# Patient Record
Sex: Male | Born: 1960 | Race: White | Hispanic: No | Marital: Married | Smoking: Former smoker
Health system: Southern US, Community
[De-identification: ages and names within clinical notes are randomized; demographics above are authoritative.]

## PROBLEM LIST (undated history)

## (undated) DIAGNOSIS — I447 Left bundle-branch block, unspecified: Secondary | ICD-10-CM

## (undated) DIAGNOSIS — R002 Palpitations: Secondary | ICD-10-CM

## (undated) DIAGNOSIS — M722 Plantar fascial fibromatosis: Secondary | ICD-10-CM

## (undated) DIAGNOSIS — G4730 Sleep apnea, unspecified: Secondary | ICD-10-CM

## (undated) DIAGNOSIS — E785 Hyperlipidemia, unspecified: Secondary | ICD-10-CM

## (undated) DIAGNOSIS — Z85828 Personal history of other malignant neoplasm of skin: Secondary | ICD-10-CM

## (undated) DIAGNOSIS — I4891 Unspecified atrial fibrillation: Secondary | ICD-10-CM

## (undated) DIAGNOSIS — I471 Supraventricular tachycardia, unspecified: Secondary | ICD-10-CM

## (undated) DIAGNOSIS — C801 Malignant (primary) neoplasm, unspecified: Secondary | ICD-10-CM

## (undated) DIAGNOSIS — Z7901 Long term (current) use of anticoagulants: Secondary | ICD-10-CM

## (undated) DIAGNOSIS — R0609 Other forms of dyspnea: Secondary | ICD-10-CM

## (undated) DIAGNOSIS — I10 Essential (primary) hypertension: Secondary | ICD-10-CM

## (undated) DIAGNOSIS — M1990 Unspecified osteoarthritis, unspecified site: Secondary | ICD-10-CM

## (undated) DIAGNOSIS — R0602 Shortness of breath: Secondary | ICD-10-CM

## (undated) DIAGNOSIS — G4733 Obstructive sleep apnea (adult) (pediatric): Secondary | ICD-10-CM

## (undated) DIAGNOSIS — N400 Enlarged prostate without lower urinary tract symptoms: Secondary | ICD-10-CM

## (undated) DIAGNOSIS — I2510 Atherosclerotic heart disease of native coronary artery without angina pectoris: Secondary | ICD-10-CM

## (undated) DIAGNOSIS — I495 Sick sinus syndrome: Secondary | ICD-10-CM

## (undated) DIAGNOSIS — K635 Polyp of colon: Secondary | ICD-10-CM

## (undated) HISTORY — DX: Unspecified osteoarthritis, unspecified site: M19.90

---

## 1990-10-16 HISTORY — PX: HERNIA REPAIR: SHX51

## 2000-04-30 ENCOUNTER — Encounter: Payer: Self-pay | Admitting: Emergency Medicine

## 2000-04-30 ENCOUNTER — Emergency Department (HOSPITAL_COMMUNITY): Admission: EM | Admit: 2000-04-30 | Discharge: 2000-04-30 | Payer: Self-pay | Admitting: *Deleted

## 2010-07-10 ENCOUNTER — Emergency Department (HOSPITAL_COMMUNITY): Admission: EM | Admit: 2010-07-10 | Discharge: 2010-07-10 | Payer: Self-pay | Admitting: Family Medicine

## 2011-01-15 HISTORY — PX: COLONOSCOPY: SHX5424

## 2011-01-15 LAB — HM COLONOSCOPY

## 2015-02-17 LAB — LIPID PANEL
CHOLESTEROL: 173 mg/dL (ref 0–200)
HDL: 38 mg/dL (ref 35–70)
LDL Cholesterol: 109 mg/dL
TRIGLYCERIDES: 129 mg/dL (ref 40–160)

## 2015-02-17 LAB — CBC AND DIFFERENTIAL: Hemoglobin: 41.3 g/dL — AB (ref 13.5–17.5)

## 2015-02-17 LAB — BASIC METABOLIC PANEL
BUN: 15 mg/dL (ref 4–21)
Creatinine: 1 mg/dL (ref <=1.3)

## 2015-02-17 LAB — TSH: TSH: 1.9 u[IU]/mL (ref <=5.90)

## 2015-02-17 LAB — PSA: PSA: 3

## 2015-06-14 ENCOUNTER — Other Ambulatory Visit: Payer: Self-pay | Admitting: Internal Medicine

## 2015-07-15 ENCOUNTER — Other Ambulatory Visit: Payer: Self-pay | Admitting: Internal Medicine

## 2015-07-26 ENCOUNTER — Encounter: Payer: Self-pay | Admitting: Internal Medicine

## 2015-07-26 ENCOUNTER — Other Ambulatory Visit: Payer: Self-pay | Admitting: Internal Medicine

## 2015-07-26 DIAGNOSIS — M722 Plantar fascial fibromatosis: Secondary | ICD-10-CM | POA: Insufficient documentation

## 2015-07-26 DIAGNOSIS — I10 Essential (primary) hypertension: Secondary | ICD-10-CM | POA: Insufficient documentation

## 2015-07-26 DIAGNOSIS — Z85828 Personal history of other malignant neoplasm of skin: Secondary | ICD-10-CM | POA: Insufficient documentation

## 2015-07-26 DIAGNOSIS — Z86010 Personal history of colonic polyps: Secondary | ICD-10-CM | POA: Insufficient documentation

## 2015-08-20 ENCOUNTER — Encounter: Payer: Self-pay | Admitting: Internal Medicine

## 2015-08-20 ENCOUNTER — Ambulatory Visit (INDEPENDENT_AMBULATORY_CARE_PROVIDER_SITE_OTHER): Payer: BC Managed Care – PPO | Admitting: Internal Medicine

## 2015-08-20 VITALS — BP 138/88 | HR 64 | Ht 71.0 in | Wt 238.2 lb

## 2015-08-20 DIAGNOSIS — I10 Essential (primary) hypertension: Secondary | ICD-10-CM

## 2015-08-20 DIAGNOSIS — Z86010 Personal history of colonic polyps: Secondary | ICD-10-CM | POA: Diagnosis not present

## 2015-08-20 MED ORDER — VALSARTAN-HYDROCHLOROTHIAZIDE 320-25 MG PO TABS: 1.0000 | ORAL_TABLET | Freq: Every day | ORAL | Status: DC

## 2015-08-20 MED ORDER — AMLODIPINE BESYLATE 10 MG PO TABS: 10.0000 mg | ORAL_TABLET | Freq: Every day | ORAL | Status: DC

## 2015-08-20 NOTE — Progress Notes (Signed)
Date:  08/20/2015   Name:  John Hanna   DOB:  June 01, 1961   MRN:  [id number]   Chief Complaint: Hypertension Hypertension This is a chronic problem. The current episode started more than 1 year ago. The problem is unchanged. The problem is resistant. Pertinent negatives include no chest pain, palpitations or shortness of breath. There are no associated agents to hypertension. Risk factors for coronary artery disease include diabetes mellitus and dyslipidemia. Past treatments include calcium channel blockers, angiotensin blockers and diuretics. The current treatment provides moderate improvement. There are no compliance problems.    He does not check his blood pressures outside of this office. He has been trying to watch his sodium intake. He has lost 4 pounds since last visit. Dizziness - patient reports 1 episode of dizziness while driving. It lasted about one hour and was worse with turning his head. He had no other symptoms at the time such as sinus congestion or nausea. He had eaten a high sodium breakfast that morning.  Review of Systems  Constitutional: Negative for fever, chills and fatigue.  HENT: Negative for ear pain, sinus pressure, sore throat and tinnitus.   Respiratory: Negative for choking, shortness of breath and wheezing.   Cardiovascular: Negative for chest pain, palpitations and leg swelling.  Gastrointestinal: Negative for abdominal pain.  Allergic/Immunologic: Negative for environmental allergies.  Neurological: Positive for dizziness (lasting one minute a few weeks ago). Negative for tremors, syncope and numbness.    Patient Active Problem List   Diagnosis Date Noted  . Essential (primary) hypertension 07/26/2015  . History of colon polyps 07/26/2015  . H/O malignant neoplasm of skin 07/26/2015  . Plantar fasciitis 07/26/2015    Prior to Admission medications   Medication Sig Start Date End Date Taking? Authorizing Provider  amLODipine (NORVASC) 10 MG tablet  Take 1 tablet by mouth daily. 08/19/14  Yes Historical Provider, MD  meloxicam (MOBIC) 15 MG tablet TAKE ONE TABLET BY MOUTH ONCE DAILY 07/15/15  Yes [name], MD  valsartan-hydrochlorothiazide (DIOVAN HCT) 320-25 MG tablet Take 1 tablet by mouth daily. 08/19/14  Yes Historical Provider, MD    No Known Allergies  Past Surgical History  Procedure Laterality Date  . Hernia repair  1992    Social History  Substance Use Topics  . Smoking status: Former Research scientist (life sciences)  . Smokeless tobacco: None  . Alcohol Use: No     Medication list has been reviewed and updated.  Physical Exam  Constitutional: He is oriented to person, place, and time. He appears well-developed and well-nourished. No distress.  HENT:  Head: Normocephalic and atraumatic.  Right Ear: Tympanic membrane and ear canal normal.  Left Ear: Tympanic membrane and ear canal normal.  Nose: Right sinus exhibits no maxillary sinus tenderness and no frontal sinus tenderness. Left sinus exhibits no maxillary sinus tenderness and no frontal sinus tenderness.  Mouth/Throat: Uvula is midline and oropharynx is clear and moist.  Eyes: Conjunctivae and EOM are normal. Pupils are equal, round, and reactive to light. Right eye exhibits no discharge. Left eye exhibits no discharge. No scleral icterus.  Neck: Normal range of motion. No thyromegaly present.  Cardiovascular: Normal rate, regular rhythm and normal heart sounds.   Pulmonary/Chest: Effort normal and breath sounds normal. No respiratory distress.  Musculoskeletal: Normal range of motion. He exhibits no edema.  Neurological: He is alert and oriented to person, place, and time.  Skin: Skin is warm and dry. No rash noted.  Psychiatric: He has a normal mood and  affect. His behavior is normal. Thought content normal.  Nursing note and vitals reviewed.   BP 138/88 mmHg  Pulse 64  Ht 5\' 11"  (1.803 m)  Wt 238 lb 3.2 oz (108.047 kg)  BMI 33.24 kg/m2  Assessment and Plan: 1.  Essential (primary) hypertension Fair control on max dose of 2 medications Continue low-sodium diet and monitor blood pressures outside this office Follow-up if vertigo returns and is persistent - amLODipine (NORVASC) 10 MG tablet; Take 1 tablet (10 mg total) by mouth daily.  Dispense: 30 tablet; Refill: 5 - valsartan-hydrochlorothiazide (DIOVAN HCT) 320-25 MG tablet; Take 1 tablet by mouth daily.  Dispense: 30 tablet; Refill: 5  2. History of colon polyps Patient will schedule colonoscopy in the near future   Halina Maidens, MD [facility]  08/20/2015

## 2016-01-12 ENCOUNTER — Other Ambulatory Visit: Payer: Self-pay | Admitting: Internal Medicine

## 2016-02-21 ENCOUNTER — Ambulatory Visit (INDEPENDENT_AMBULATORY_CARE_PROVIDER_SITE_OTHER): Payer: BC Managed Care – PPO | Admitting: Internal Medicine

## 2016-02-21 ENCOUNTER — Encounter: Payer: Self-pay | Admitting: Internal Medicine

## 2016-02-21 VITALS — BP 120/78 | HR 67 | Temp 98.7°F | Resp 16 | Ht 73.0 in | Wt 246.6 lb

## 2016-02-21 DIAGNOSIS — Z1159 Encounter for screening for other viral diseases: Secondary | ICD-10-CM

## 2016-02-21 DIAGNOSIS — Z125 Encounter for screening for malignant neoplasm of prostate: Secondary | ICD-10-CM | POA: Diagnosis not present

## 2016-02-21 DIAGNOSIS — Z86010 Personal history of colonic polyps: Secondary | ICD-10-CM

## 2016-02-21 DIAGNOSIS — Z0000 Encounter for general adult medical examination without abnormal findings: Secondary | ICD-10-CM | POA: Diagnosis not present

## 2016-02-21 DIAGNOSIS — I10 Essential (primary) hypertension: Secondary | ICD-10-CM

## 2016-02-21 LAB — POCT URINALYSIS DIPSTICK
BILIRUBIN UA: NEGATIVE
Glucose, UA: NEGATIVE
KETONES UA: NEGATIVE
Leukocytes, UA: NEGATIVE
NITRITE UA: NEGATIVE
PH UA: 6
PROTEIN UA: NEGATIVE
RBC UA: NEGATIVE
Spec Grav, UA: 1.025
Urobilinogen, UA: 0.2

## 2016-02-21 MED ORDER — AMLODIPINE BESYLATE 10 MG PO TABS: 10.0000 mg | ORAL_TABLET | Freq: Every day | ORAL | Status: DC

## 2016-02-21 MED ORDER — VALSARTAN-HYDROCHLOROTHIAZIDE 320-25 MG PO TABS: 1.0000 | ORAL_TABLET | Freq: Every day | ORAL | Status: DC

## 2016-02-21 NOTE — Progress Notes (Signed)
Date:  02/21/2016   Name:  John Hanna   DOB:  [birth year]   MRN:  [id number]   Chief Complaint: Annual Exam John Hanna is a 55 y.o. male who presents today for his Complete Annual Exam. He feels well. He reports exercising none but physical work. He reports he is sleeping well.   Hypertension This is a chronic problem. The current episode started more than 1 year ago. The problem is unchanged. The problem is controlled. Pertinent negatives include no chest pain, headaches, palpitations or shortness of breath. Past treatments include angiotensin blockers, diuretics and calcium channel blockers. The current treatment provides significant improvement. There are no compliance problems.     Review of Systems  Constitutional: Negative for chills, diaphoresis, appetite change, fatigue and unexpected weight change.  HENT: Negative for hearing loss, tinnitus, trouble swallowing and voice change.   Eyes: Negative for visual disturbance.  Respiratory: Negative for choking, shortness of breath and wheezing.   Cardiovascular: Negative for chest pain, palpitations and leg swelling.  Gastrointestinal: Negative for abdominal pain, diarrhea, constipation and blood in stool.  Genitourinary: Negative for dysuria, frequency and difficulty urinating.  Musculoskeletal: Negative for myalgias, back pain and arthralgias.  Skin: Negative for color change and rash.  Neurological: Negative for dizziness, syncope and headaches.  Hematological: Negative for adenopathy.  Psychiatric/Behavioral: Negative for sleep disturbance and dysphoric mood.    Patient Active Problem List   Diagnosis Date Noted  . Essential (primary) hypertension 07/26/2015  . History of colon polyps 07/26/2015  . H/O malignant neoplasm of skin 07/26/2015  . Plantar fasciitis 07/26/2015    Prior to Admission medications   Medication Sig Start Date End Date Taking? Authorizing Provider  amLODipine (NORVASC) 10 MG tablet Take 1  tablet (10 mg total) by mouth daily. 08/20/15  Yes [name], MD  meloxicam (MOBIC) 15 MG tablet TAKE ONE TABLET BY MOUTH ONCE DAILY 01/13/16  Yes [name], MD  valsartan-hydrochlorothiazide (DIOVAN HCT) 320-25 MG tablet Take 1 tablet by mouth daily. 08/20/15  Yes [name], MD    No Known Allergies  Past Surgical History  Procedure Laterality Date  . Hernia repair  1992    Social History  Substance Use Topics  . Smoking status: Former Research scientist (life sciences)  . Smokeless tobacco: None  . Alcohol Use: No    Medication list has been reviewed and updated.  Physical Exam  Constitutional: He is oriented to person, place, and time. He appears well-developed and well-nourished.  HENT:  Head: Normocephalic.  Right Ear: Tympanic membrane, external ear and ear canal normal.  Left Ear: Tympanic membrane, external ear and ear canal normal.  Nose: Nose normal.  Mouth/Throat: Uvula is midline and oropharynx is clear and moist.  Eyes: Conjunctivae and EOM are normal. Pupils are equal, round, and reactive to light.  Neck: Normal range of motion. Neck supple. Carotid bruit is not present. No thyromegaly present.  Cardiovascular: Normal rate, regular rhythm, normal heart sounds and intact distal pulses.   Pulmonary/Chest: Effort normal and breath sounds normal. He has no wheezes. Right breast exhibits no mass. Left breast exhibits no mass.  Abdominal: Soft. Normal appearance and bowel sounds are normal. There is no hepatosplenomegaly. There is no tenderness.  Genitourinary: Rectum normal and prostate normal. Prostate is not enlarged and not tender.  Musculoskeletal: Normal range of motion.  Lymphadenopathy:    He has no cervical adenopathy.  Neurological: He is alert and oriented to person, place, and time. He has normal  reflexes.  Skin: Skin is warm, dry and intact.  Psychiatric: He has a normal mood and affect. His speech is normal and behavior is normal. Judgment and thought content  normal.  Nursing note and vitals reviewed.   BP 120/78 mmHg  Pulse 67  Temp(Src) 98.7 F (37.1 C) (Oral)  Resp 16  Ht 6\' 1"  (1.854 m)  Wt 246 lb 9.6 oz (111.857 kg)  BMI 32.54 kg/m2  SpO2 97%  Assessment and Plan: 1. Annual physical exam Normal exam; encourage regular exercise Planning to retire this summer - Lipid panel - POCT urinalysis dipstick  2. Essential (primary) hypertension controlled - CBC with Differential/Platelet - Comprehensive metabolic panel - valsartan-hydrochlorothiazide (DIOVAN HCT) 320-25 MG tablet; Take 1 tablet by mouth daily.  Dispense: 30 tablet; Refill: 12 - amLODipine (NORVASC) 10 MG tablet; Take 1 tablet (10 mg total) by mouth daily.  Dispense: 30 tablet; Refill: 12  3. History of colon polyps Patient enoucouraged to schedule follow up  4. Prostate cancer screening Normal exam - - PSA  5. Need for hepatitis C screening test - Hepatitis C antibody   Halina Maidens, MD [facility]  02/21/2016

## 2016-02-21 NOTE — Patient Instructions (Signed)
DASH Eating Plan  DASH stands for "Dietary Approaches to Stop Hypertension." The DASH eating plan is a healthy eating plan that has been shown to reduce high blood pressure (hypertension). Additional health benefits may include reducing the risk of type 2 diabetes mellitus, heart disease, and stroke. The DASH eating plan may also help with weight loss.  WHAT DO I NEED TO KNOW ABOUT THE DASH EATING PLAN?  For the DASH eating plan, you will follow these general guidelines:  · Choose foods with a percent daily value for sodium of less than 5% (as listed on the food label).  · Use salt-free seasonings or herbs instead of table salt or sea salt.  · Check with your health care provider or pharmacist before using salt substitutes.  · Eat lower-sodium products, often labeled as "lower sodium" or "no salt added."  · Eat fresh foods.  · Eat more vegetables, fruits, and low-fat dairy products.  · Choose whole grains. Look for the word "whole" as the first word in the ingredient list.  · Choose fish and skinless chicken or turkey more often than red meat. Limit fish, poultry, and meat to 6 oz (170 g) each day.  · Limit sweets, desserts, sugars, and sugary drinks.  · Choose heart-healthy fats.  · Limit cheese to 1 oz (28 g) per day.  · Eat more home-cooked food and less restaurant, buffet, and fast food.  · Limit fried foods.  · Cook foods using methods other than frying.  · Limit canned vegetables. If you do use them, rinse them well to decrease the sodium.  · When eating at a restaurant, ask that your food be prepared with less salt, or no salt if possible.  WHAT FOODS CAN I EAT?  Seek help from a dietitian for individual calorie needs.  Grains  Whole grain or whole wheat bread. Brown rice. Whole grain or whole wheat pasta. Quinoa, bulgur, and whole grain cereals. Low-sodium cereals. Corn or whole wheat flour tortillas. Whole grain cornbread. Whole grain crackers. Low-sodium crackers.  Vegetables  Fresh or frozen vegetables  (raw, steamed, roasted, or grilled). Low-sodium or reduced-sodium tomato and vegetable juices. Low-sodium or reduced-sodium tomato sauce and paste. Low-sodium or reduced-sodium canned vegetables.   Fruits  All fresh, canned (in natural juice), or frozen fruits.  Meat and Other Protein Products  Ground beef (85% or leaner), grass-fed beef, or beef trimmed of fat. Skinless chicken or turkey. Ground chicken or turkey. Pork trimmed of fat. All fish and seafood. Eggs. Dried beans, peas, or lentils. Unsalted nuts and seeds. Unsalted canned beans.  Dairy  Low-fat dairy products, such as skim or 1% milk, 2% or reduced-fat cheeses, low-fat ricotta or cottage cheese, or plain low-fat yogurt. Low-sodium or reduced-sodium cheeses.  Fats and Oils  Tub margarines without trans fats. Light or reduced-fat mayonnaise and salad dressings (reduced sodium). Avocado. Safflower, olive, or canola oils. Natural peanut or almond butter.  Other  Unsalted popcorn and pretzels.  The items listed above may not be a complete list of recommended foods or beverages. Contact your dietitian for more options.  WHAT FOODS ARE NOT RECOMMENDED?  Grains  White bread. White pasta. White rice. Refined cornbread. Bagels and croissants. Crackers that contain trans fat.  Vegetables  Creamed or fried vegetables. Vegetables in a cheese sauce. Regular canned vegetables. Regular canned tomato sauce and paste. Regular tomato and vegetable juices.  Fruits  Dried fruits. Canned fruit in light or heavy syrup. Fruit juice.  Meat and Other Protein   Products  Fatty cuts of meat. Ribs, chicken wings, bacon, sausage, bologna, salami, chitterlings, fatback, hot dogs, bratwurst, and packaged luncheon meats. Salted nuts and seeds. Canned beans with salt.  Dairy  Whole or 2% milk, cream, half-and-half, and cream cheese. Whole-fat or sweetened yogurt. Full-fat cheeses or blue cheese. Nondairy creamers and whipped toppings. Processed cheese, cheese spreads, or cheese  curds.  Condiments  Onion and garlic salt, seasoned salt, table salt, and sea salt. Canned and packaged gravies. Worcestershire sauce. Tartar sauce. Barbecue sauce. Teriyaki sauce. Soy sauce, including reduced sodium. Steak sauce. Fish sauce. Oyster sauce. Cocktail sauce. Horseradish. Ketchup and mustard. Meat flavorings and tenderizers. Bouillon cubes. Hot sauce. Tabasco sauce. Marinades. Taco seasonings. Relishes.  Fats and Oils  Butter, stick margarine, lard, shortening, ghee, and bacon fat. Coconut, palm kernel, or palm oils. Regular salad dressings.  Other  Pickles and olives. Salted popcorn and pretzels.  The items listed above may not be a complete list of foods and beverages to avoid. Contact your dietitian for more information.  WHERE CAN I FIND MORE INFORMATION?  National Heart, Lung, and Blood Institute: www.nhlbi.nih.gov/health/health-topics/topics/dash/     This information is not intended to replace advice given to you by your health care provider. Make sure you discuss any questions you have with your health care provider.     Document Released: 09/21/2011 Document Revised: 10/23/2014 Document Reviewed: 08/06/2013  Elsevier Interactive Patient Education ©2016 [employer] Inc[employer]

## 2016-02-22 LAB — COMPREHENSIVE METABOLIC PANEL
A/G RATIO: 1.4 (ref 1.2–2.2)
ALBUMIN: 4.2 g/dL (ref 3.5–5.5)
ALT: 36 IU/L (ref 0–44)
AST: 20 IU/L (ref 0–40)
Alkaline Phosphatase: 50 IU/L (ref 39–117)
BUN / CREAT RATIO: 13 (ref 9–20)
BUN: 14 mg/dL (ref 6–24)
Bilirubin Total: 0.4 mg/dL (ref 0.0–1.2)
CALCIUM: 9.1 mg/dL (ref 8.7–10.2)
CO2: 25 mmol/L (ref 18–29)
Chloride: 97 mmol/L (ref 96–106)
Creatinine, Ser: 1.05 mg/dL (ref 0.76–1.27)
GFR calc Af Amer: 94 mL/min/{1.73_m2} (ref >59)
GFR, EST NON AFRICAN AMERICAN: 81 mL/min/{1.73_m2} (ref >59)
GLOBULIN, TOTAL: 2.9 g/dL (ref 1.5–4.5)
Glucose: 105 mg/dL — ABNORMAL HIGH (ref 65–99)
Potassium: 4.3 mmol/L (ref 3.5–5.2)
SODIUM: 139 mmol/L (ref 134–144)
TOTAL PROTEIN: 7.1 g/dL (ref 6.0–8.5)

## 2016-02-22 LAB — LIPID PANEL
CHOL/HDL RATIO: 4.4 ratio (ref 0.0–5.0)
CHOLESTEROL TOTAL: 174 mg/dL (ref 100–199)
HDL: 40 mg/dL (ref >39)
LDL CALC: 106 mg/dL — AB (ref 0–99)
TRIGLYCERIDES: 138 mg/dL (ref 0–149)
VLDL CHOLESTEROL CAL: 28 mg/dL (ref 5–40)

## 2016-02-22 LAB — CBC WITH DIFFERENTIAL/PLATELET
BASOS: 0 %
Basophils Absolute: 0 10*3/uL (ref 0.0–0.2)
EOS (ABSOLUTE): 0.2 10*3/uL (ref 0.0–0.4)
EOS: 2 %
HEMATOCRIT: 44.7 % (ref 37.5–51.0)
Hemoglobin: 14.7 g/dL (ref 12.6–17.7)
IMMATURE GRANS (ABS): 0 10*3/uL (ref 0.0–0.1)
IMMATURE GRANULOCYTES: 0 %
LYMPHS: 31 %
Lymphocytes Absolute: 2.6 10*3/uL (ref 0.7–3.1)
MCH: 29.8 pg (ref 26.6–33.0)
MCHC: 32.9 g/dL (ref 31.5–35.7)
MCV: 91 fL (ref 79–97)
MONOS ABS: 0.8 10*3/uL (ref 0.1–0.9)
Monocytes: 9 %
NEUTROS PCT: 58 %
Neutrophils Absolute: 4.9 10*3/uL (ref 1.4–7.0)
Platelets: 253 10*3/uL (ref 150–379)
RBC: 4.93 x10E6/uL (ref 4.14–5.80)
RDW: 14 % (ref 12.3–15.4)
WBC: 8.5 10*3/uL (ref 3.4–10.8)

## 2016-02-22 LAB — PSA: Prostate Specific Ag, Serum: 2.6 ng/mL (ref 0.0–4.0)

## 2016-02-22 LAB — HEPATITIS C ANTIBODY: Hep C Virus Ab: <0.1 s/co ratio (ref 0.0–0.9)

## 2016-08-23 ENCOUNTER — Ambulatory Visit (INDEPENDENT_AMBULATORY_CARE_PROVIDER_SITE_OTHER): Payer: BC Managed Care – PPO | Admitting: Internal Medicine

## 2016-08-23 ENCOUNTER — Encounter: Payer: Self-pay | Admitting: Internal Medicine

## 2016-08-23 VITALS — BP 132/84 | HR 68 | Resp 14 | Ht 74.0 in | Wt 238.6 lb

## 2016-08-23 DIAGNOSIS — R42 Dizziness and giddiness: Secondary | ICD-10-CM | POA: Diagnosis not present

## 2016-08-23 DIAGNOSIS — I10 Essential (primary) hypertension: Secondary | ICD-10-CM | POA: Diagnosis not present

## 2016-08-23 NOTE — Progress Notes (Signed)
Date:  08/23/2016   Name:  John Hanna   DOB:  1961/09/30   MRN:  [id number]   Chief Complaint: Hypertension Hypertension  Pertinent negatives include no chest pain, headaches, palpitations or shortness of breath. Past treatments include angiotensin blockers, diuretics and calcium channel blockers. The current treatment provides significant improvement.  Dizziness  This is a new problem. The current episode started 1 to 4 weeks ago. The problem occurs rarely. The problem has been waxing and waning. Associated symptoms include congestion and vertigo. Pertinent negatives include no abdominal pain, chest pain, coughing, fatigue, headaches, numbness or rash. The symptoms are aggravated by twisting. He has tried nothing for the symptoms.      Review of Systems  Constitutional: Negative for appetite change, fatigue and unexpected weight change.  HENT: Positive for congestion.   Eyes: Negative for visual disturbance.  Respiratory: Negative for cough, shortness of breath and wheezing.   Cardiovascular: Negative for chest pain, palpitations and leg swelling.  Gastrointestinal: Negative for abdominal pain and blood in stool.  Endocrine: Negative for polydipsia and polyuria.  Genitourinary: Negative for dysuria and hematuria.  Skin: Negative for color change and rash.  Neurological: Positive for dizziness and vertigo. Negative for tremors, numbness and headaches.  Psychiatric/Behavioral: Negative for dysphoric mood.    Patient Active Problem List   Diagnosis Date Noted  . Essential (primary) hypertension 07/26/2015  . History of colon polyps 07/26/2015  . H/O malignant neoplasm of skin 07/26/2015  . Plantar fasciitis 07/26/2015    Prior to Admission medications   Medication Sig Start Date End Date Taking? Authorizing Provider  amLODipine (NORVASC) 10 MG tablet Take 1 tablet (10 mg total) by mouth daily. 02/21/16   [name], MD  meloxicam (MOBIC) 15 MG tablet TAKE ONE TABLET  BY MOUTH ONCE DAILY 01/13/16   [name], MD  valsartan-hydrochlorothiazide (DIOVAN HCT) 320-25 MG tablet Take 1 tablet by mouth daily. 02/21/16   [name], MD    No Known Allergies  Past Surgical History:  Procedure Laterality Date  . HERNIA REPAIR  1992    Social History  Substance Use Topics  . Smoking status: Former Research scientist (life sciences)  . Smokeless tobacco: Not on file  . Alcohol use No     Medication list has been reviewed and updated.   Physical Exam  Constitutional: He is oriented to person, place, and time. He appears well-developed and well-nourished. No distress.  HENT:  Head: Normocephalic and atraumatic.  Right Ear: Tympanic membrane and ear canal normal.  Left Ear: Tympanic membrane and ear canal normal.  Eyes: Conjunctivae and EOM are normal. Pupils are equal, round, and reactive to light.  Neck: Normal range of motion. Neck supple. No thyromegaly present.  Cardiovascular: Normal rate, regular rhythm and normal heart sounds.   Pulmonary/Chest: Effort normal and breath sounds normal. No respiratory distress.  Musculoskeletal: Normal range of motion. He exhibits no edema or tenderness.  Lymphadenopathy:    He has no cervical adenopathy.  Neurological: He is alert and oriented to person, place, and time.  Skin: Skin is warm and dry. No rash noted.  Psychiatric: He has a normal mood and affect. His behavior is normal. Thought content normal.  Nursing note and vitals reviewed.   BP 132/84   Pulse 68   Resp 14   Ht 6\' 2"  (1.88 m)   Wt 238 lb 9.6 oz (108.2 kg)   BMI 30.63 kg/m   Assessment and Plan: 1. Essential (primary) hypertension Controlled -  continue current medication  2. Vertigo Transient - recommend decongestants PRN   Halina Maidens, MD [facility]  08/23/2016

## 2016-11-24 ENCOUNTER — Ambulatory Visit (INDEPENDENT_AMBULATORY_CARE_PROVIDER_SITE_OTHER): Payer: BC Managed Care – PPO | Admitting: Internal Medicine

## 2016-11-24 ENCOUNTER — Encounter: Payer: Self-pay | Admitting: Internal Medicine

## 2016-11-24 VITALS — BP 142/82 | HR 92 | Temp 99.2°F | Resp 16 | Ht 73.0 in | Wt 233.0 lb

## 2016-11-24 DIAGNOSIS — J111 Influenza due to unidentified influenza virus with other respiratory manifestations: Secondary | ICD-10-CM

## 2016-11-24 DIAGNOSIS — R69 Illness, unspecified: Secondary | ICD-10-CM

## 2016-11-24 MED ORDER — GUAIFENESIN-CODEINE 100-10 MG/5ML PO SYRP: 5.0000 mL | ORAL_SOLUTION | Freq: Three times a day (TID) | ORAL | 0 refills | Status: DC | PRN

## 2016-11-24 MED ORDER — OSELTAMIVIR PHOSPHATE 75 MG PO CAPS: 75.0000 mg | ORAL_CAPSULE | Freq: Two times a day (BID) | ORAL | 0 refills | Status: DC

## 2016-11-24 NOTE — Progress Notes (Signed)
Date:  11/24/2016   Name:  John Hanna   DOB:  1961/05/03   MRN:  [id number]   Chief Complaint: URI (since yesterday. ) URI   This is a new problem. The current episode started yesterday. The problem has been gradually worsening. There has been no fever. Associated symptoms include coughing and headaches. Pertinent negatives include no abdominal pain, chest pain, nausea, sore throat, vomiting or wheezing. He has tried nothing for the symptoms.  Onset yesterday - has been in and out of nursing home and hospital with uncle who passed away yesterday from pneumonia and influenza.    Review of Systems  Constitutional: Positive for chills, fatigue and fever.  HENT: Positive for postnasal drip. Negative for sore throat.   Eyes: Negative for visual disturbance.  Respiratory: Positive for cough. Negative for shortness of breath and wheezing.   Cardiovascular: Negative for chest pain.  Gastrointestinal: Negative for abdominal pain, nausea and vomiting.  Musculoskeletal: Positive for myalgias.  Neurological: Positive for headaches.    Patient Active Problem List   Diagnosis Date Noted  . Essential (primary) hypertension 07/26/2015  . History of colon polyps 07/26/2015  . H/O malignant neoplasm of skin 07/26/2015  . Plantar fasciitis 07/26/2015    Prior to Admission medications   Medication Sig Start Date End Date Taking? Authorizing Provider  amLODipine (NORVASC) 10 MG tablet Take 1 tablet (10 mg total) by mouth daily. 02/21/16  Yes [name], MD  valsartan-hydrochlorothiazide (DIOVAN HCT) 320-25 MG tablet Take 1 tablet by mouth daily. 02/21/16  Yes [name], MD  meloxicam (MOBIC) 15 MG tablet TAKE ONE TABLET BY MOUTH ONCE DAILY Patient not taking: Reported on 11/24/2016 01/13/16   [name], MD    No Known Allergies  Past Surgical History:  Procedure Laterality Date  . HERNIA REPAIR  1992    Social History  Substance Use Topics  . Smoking status: Former  Research scientist (life sciences)  . Smokeless tobacco: Never Used  . Alcohol use No     Medication list has been reviewed and updated.   Physical Exam  Constitutional: He is oriented to person, place, and time. He appears well-developed. No distress.  HENT:  Head: Normocephalic and atraumatic.  Right Ear: Tympanic membrane and ear canal normal.  Left Ear: Tympanic membrane and ear canal normal.  Nose: Right sinus exhibits no maxillary sinus tenderness and no frontal sinus tenderness. Left sinus exhibits no maxillary sinus tenderness and no frontal sinus tenderness.  Mouth/Throat: Posterior oropharyngeal erythema present. No posterior oropharyngeal edema.  Cardiovascular: Normal rate, regular rhythm and normal heart sounds.   Pulmonary/Chest: Effort normal. He has decreased breath sounds.  Musculoskeletal: Normal range of motion.  Neurological: He is alert and oriented to person, place, and time.  Skin: Skin is warm and dry. No rash noted.  Psychiatric: He has a normal mood and affect. His speech is normal and behavior is normal. Thought content normal.  Nursing note and vitals reviewed.   BP (!) 142/82 (BP Location: Right Arm, Patient Position: Sitting, Cuff Size: Large)   Pulse 92   Temp 98.9 F (37.2 C)   Resp 16   Ht 6\' 1"  (1.854 m)   Wt 233 lb (105.7 kg)   SpO2 98%   BMI 30.74 kg/m   Assessment and Plan: 1. Influenza-like illness Tylenol, fluids and rest - oseltamivir (TAMIFLU) 75 MG capsule; Take 1 capsule (75 mg total) by mouth 2 (two) times daily.  Dispense: 10 capsule; Refill: 0 - guaiFENesin-codeine (  ROBITUSSIN AC) 100-10 MG/5ML syrup; Take 5 mLs by mouth 3 (three) times daily as needed for cough.  Dispense: 150 mL; Refill: 0   Halina Maidens, MD [facility]  11/24/2016

## 2016-11-24 NOTE — Patient Instructions (Signed)

## 2017-02-20 ENCOUNTER — Ambulatory Visit (INDEPENDENT_AMBULATORY_CARE_PROVIDER_SITE_OTHER): Payer: BC Managed Care – PPO | Admitting: Internal Medicine

## 2017-02-20 ENCOUNTER — Encounter: Payer: Self-pay | Admitting: Internal Medicine

## 2017-02-20 VITALS — BP 122/84 | HR 62 | Ht 73.0 in | Wt 232.0 lb

## 2017-02-20 DIAGNOSIS — I10 Essential (primary) hypertension: Secondary | ICD-10-CM

## 2017-02-20 DIAGNOSIS — Z0000 Encounter for general adult medical examination without abnormal findings: Secondary | ICD-10-CM

## 2017-02-20 DIAGNOSIS — Z125 Encounter for screening for malignant neoplasm of prostate: Secondary | ICD-10-CM

## 2017-02-20 DIAGNOSIS — D485 Neoplasm of uncertain behavior of skin: Secondary | ICD-10-CM

## 2017-02-20 DIAGNOSIS — M722 Plantar fascial fibromatosis: Secondary | ICD-10-CM

## 2017-02-20 DIAGNOSIS — Z86010 Personal history of colonic polyps: Secondary | ICD-10-CM

## 2017-02-20 LAB — POCT URINALYSIS DIPSTICK
Bilirubin, UA: NEGATIVE
Glucose, UA: NEGATIVE
Ketones, UA: NEGATIVE
LEUKOCYTES UA: NEGATIVE
NITRITE UA: NEGATIVE
PH UA: 5 (ref 5.0–8.0)
Protein, UA: NEGATIVE
Spec Grav, UA: 1.01 (ref 1.010–1.025)
UROBILINOGEN UA: 0.2 U/dL

## 2017-02-20 MED ORDER — AMLODIPINE BESYLATE 10 MG PO TABS: 10.0000 mg | ORAL_TABLET | Freq: Every day | ORAL | 12 refills | Status: DC

## 2017-02-20 MED ORDER — VALSARTAN-HYDROCHLOROTHIAZIDE 320-25 MG PO TABS: 1.0000 | ORAL_TABLET | Freq: Every day | ORAL | 12 refills | Status: DC

## 2017-02-20 NOTE — Patient Instructions (Addendum)
Call [facility] Medicine to schedule colonoscopy. Consult Dermatology  DASH Eating Plan DASH stands for "Dietary Approaches to Stop Hypertension." The DASH eating plan is a healthy eating plan that has been shown to reduce high blood pressure (hypertension). It may also reduce your risk for type 2 diabetes, heart disease, and stroke. The DASH eating plan may also help with weight loss. What are tips for following this plan? General guidelines   Avoid eating more than 2,300 mg (milligrams) of salt (sodium) a day. If you have hypertension, you may need to reduce your sodium intake to 1,500 mg a day.  Limit alcohol intake to no more than 1 drink a day for nonpregnant women and 2 drinks a day for men. One drink equals 12 oz of beer, 5 oz of wine, or 1 oz of hard liquor.  Work with your health care provider to maintain a healthy body weight or to lose weight. Ask what an ideal weight is for you.  Get at least 30 minutes of exercise that causes your heart to beat faster (aerobic exercise) most days of the week. Activities may include walking, swimming, or biking.  Work with your health care provider or diet and nutrition specialist (dietitian) to adjust your eating plan to your individual calorie needs. Reading food labels   Check food labels for the amount of sodium per serving. Choose foods with less than 5 percent of the Daily Value of sodium. Generally, foods with less than 300 mg of sodium per serving fit into this eating plan.  To find whole grains, look for the word "whole" as the first word in the ingredient list. Shopping   Buy products labeled as "low-sodium" or "no salt added."  Buy fresh foods. Avoid canned foods and premade or frozen meals. Cooking   Avoid adding salt when cooking. Use salt-free seasonings or herbs instead of table salt or sea salt. Check with your health care provider or pharmacist before using salt substitutes.  Do not fry foods. Cook foods using  healthy methods such as baking, boiling, grilling, and broiling instead.  Cook with heart-healthy oils, such as olive, canola, soybean, or sunflower oil. Meal planning    Eat a balanced diet that includes:  5 or more servings of fruits and vegetables each day. At each meal, try to fill half of your plate with fruits and vegetables.  Up to 6-8 servings of whole grains each day.  Less than 6 oz of lean meat, poultry, or fish each day. A 3-oz serving of meat is about the same size as a deck of cards. One egg equals 1 oz.  2 servings of low-fat dairy each day.  A serving of nuts, seeds, or beans 5 times each week.  Heart-healthy fats. Healthy fats called Omega-3 fatty acids are found in foods such as flaxseeds and coldwater fish, like sardines, salmon, and mackerel.  Limit how much you eat of the following:  Canned or prepackaged foods.  Food that is high in trans fat, such as fried foods.  Food that is high in saturated fat, such as fatty meat.  Sweets, desserts, sugary drinks, and other foods with added sugar.  Full-fat dairy products.  Do not salt foods before eating.  Try to eat at least 2 vegetarian meals each week.  Eat more home-cooked food and less restaurant, buffet, and fast food.  When eating at a restaurant, ask that your food be prepared with less salt or no salt, if possible. What foods are  recommended? The items listed may not be a complete list. Talk with your dietitian about what dietary choices are best for you. Grains  Whole-grain or whole-wheat bread. Whole-grain or whole-wheat pasta. Brown rice. [place]. Bulgur. Whole-grain and low-sodium cereals. Pita bread. Low-fat, low-sodium crackers. Whole-wheat flour tortillas. Vegetables  Fresh or frozen vegetables (raw, steamed, roasted, or grilled). Low-sodium or reduced-sodium tomato and vegetable juice. Low-sodium or reduced-sodium tomato sauce and tomato paste. Low-sodium or reduced-sodium canned  vegetables. Fruits  All fresh, dried, or frozen fruit. Canned fruit in natural juice (without added sugar). Meat and other protein foods  Skinless chicken or [place]. Ground chicken or [place]. Pork with fat trimmed off. Fish and seafood. Egg whites. Dried beans, peas, or lentils. Unsalted nuts, nut butters, and seeds. Unsalted canned beans. Lean cuts of beef with fat trimmed off. Low-sodium, lean deli meat. Dairy  Low-fat (1%) or fat-free (skim) milk. Fat-free, low-fat, or reduced-fat cheeses. Nonfat, low-sodium ricotta or cottage cheese. Low-fat or nonfat yogurt. Low-fat, low-sodium cheese. Fats and oils  Soft margarine without trans fats. Vegetable oil. Low-fat, reduced-fat, or light mayonnaise and salad dressings (reduced-sodium). Canola, safflower, olive, soybean, and sunflower oils. Avocado. Seasoning and other foods  Herbs. Spices. Seasoning mixes without salt. Unsalted popcorn and pretzels. Fat-free sweets. What foods are not recommended? The items listed may not be a complete list. Talk with your dietitian about what dietary choices are best for you. Grains  Baked goods made with fat, such as croissants, muffins, or some breads. Dry pasta or rice meal packs. Vegetables  Creamed or fried vegetables. Vegetables in a cheese sauce. Regular canned vegetables (not low-sodium or reduced-sodium). Regular canned tomato sauce and paste (not low-sodium or reduced-sodium). Regular tomato and vegetable juice (not low-sodium or reduced-sodium). Angie Fava. Olives. Fruits  Canned fruit in a light or heavy syrup. Fried fruit. Fruit in cream or butter sauce. Meat and other protein foods  Fatty cuts of meat. Ribs. Fried meat. Berniece Salines. Sausage. Bologna and other processed lunch meats. Salami. Fatback. Hotdogs. Bratwurst. Salted nuts and seeds. Canned beans with added salt. Canned or smoked fish. Whole eggs or egg yolks. Chicken or [place] with skin. Dairy  Whole or 2% milk, cream, and half-and-half. Whole or  full-fat cream cheese. Whole-fat or sweetened yogurt. Full-fat cheese. Nondairy creamers. Whipped toppings. Processed cheese and cheese spreads. Fats and oils  Butter. Stick margarine. Lard. Shortening. Ghee. Bacon fat. Tropical oils, such as coconut, palm kernel, or palm oil. Seasoning and other foods  Salted popcorn and pretzels. Onion salt, garlic salt, seasoned salt, table salt, and sea salt. Worcestershire sauce. Tartar sauce. Barbecue sauce. Teriyaki sauce. Soy sauce, including reduced-sodium. Steak sauce. Canned and packaged gravies. Fish sauce. Oyster sauce. Cocktail sauce. Horseradish that you find on the shelf. Ketchup. Mustard. Meat flavorings and tenderizers. Bouillon cubes. Hot sauce and Tabasco sauce. Premade or packaged marinades. Premade or packaged taco seasonings. Relishes. Regular salad dressings. Where to find more information:  National Heart, Lung, and [place]: https://wilson-eaton.com/  American Heart Association: www.heart.org Summary  The DASH eating plan is a healthy eating plan that has been shown to reduce high blood pressure (hypertension). It may also reduce your risk for type 2 diabetes, heart disease, and stroke.  With the DASH eating plan, you should limit salt (sodium) intake to 2,300 mg a day. If you have hypertension, you may need to reduce your sodium intake to 1,500 mg a day.  When on the DASH eating plan, aim to eat more fresh fruits and vegetables,  whole grains, lean proteins, low-fat dairy, and heart-healthy fats.  Work with your health care provider or diet and nutrition specialist (dietitian) to adjust your eating plan to your individual calorie needs. This information is not intended to replace advice given to you by your health care provider. Make sure you discuss any questions you have with your health care provider. Document Released: 09/21/2011 Document Revised: 09/25/2016 Document Reviewed: 09/25/2016 Elsevier Interactive Patient Education  2017  [employer].

## 2017-02-20 NOTE — Progress Notes (Signed)
Date:  02/20/2017   Name:  John Hanna   DOB:  1961-04-27   MRN:  [id number]   Chief Complaint: Annual Exam John Hanna is a 56 y.o. male who presents today for his Complete Annual Exam. He feels well. 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 He reports he is sleeping well.   Hypertension  This is a chronic problem. The problem is controlled. Associated symptoms include chest pain (mild chest pain last week). Pertinent negatives include no headaches, palpitations or shortness of breath. Past treatments include calcium channel blockers, angiotensin blockers and diuretics. The current treatment provides significant improvement.     Review of Systems  Constitutional: Negative for appetite change, chills, diaphoresis, fatigue and unexpected weight change.  HENT: Negative for hearing loss, tinnitus, trouble swallowing and voice change.   Eyes: Negative for visual disturbance.  Respiratory: Negative for choking, shortness of breath and wheezing.   Cardiovascular: Positive for chest pain (mild chest pain last week). Negative for palpitations and leg swelling.  Gastrointestinal: Negative for abdominal pain, blood in stool, constipation and diarrhea.  Endocrine: Negative for polydipsia and polyuria.  Genitourinary: Negative for difficulty urinating, dysuria and frequency.  Musculoskeletal: Negative for arthralgias, back pain and myalgias.  Skin: Negative for color change and rash.  Allergic/Immunologic: Negative for environmental allergies.  Neurological: Negative for dizziness, syncope and headaches.  Hematological: Negative for adenopathy.  Psychiatric/Behavioral: Negative for dysphoric mood and sleep disturbance.    Patient Active Problem List   Diagnosis Date Noted  . Essential (primary) hypertension 07/26/2015  . History of colon polyps 07/26/2015  . H/O malignant neoplasm of skin 07/26/2015  . Plantar fasciitis 07/26/2015    Prior to  Admission medications   Medication Sig Start Date End Date Taking? Authorizing Provider  amLODipine (NORVASC) 10 MG tablet Take 1 tablet (10 mg total) by mouth daily. 02/21/16  Yes [name], MD      Yes [name], MD  meloxicam [facility]) 15 MG tablet TAKE ONE TABLET BY MOUTH ONCE DAILY 01/13/16  Yes [name], MD  valsartan-hydrochlorothiazide (DIOVAN HCT) 320-25 MG tablet Take 1 tablet by mouth daily. 02/21/16  Yes [name], MD    No Known Allergies  Past Surgical History:  Procedure Laterality Date  . HERNIA REPAIR  1992    Social History  Substance Use Topics  . Smoking status: Former Research scientist (life sciences)  . Smokeless tobacco: Never Used  . Alcohol use No   Depression screen PHQ 2/9 02/21/2016  Decreased Interest 0  Down, Depressed, Hopeless 0  PHQ - 2 Score 0     Medication list has been reviewed and updated.   Physical Exam  Constitutional: He is oriented to person, place, and time. He appears well-developed and well-nourished.  HENT:  Head: Normocephalic.  Right Ear: Tympanic membrane, external ear and ear canal normal.  Left Ear: Tympanic membrane, external ear and ear canal normal.  Nose: Nose normal.  Mouth/Throat: Uvula is midline and oropharynx is clear and moist.  Eyes: Conjunctivae and EOM are normal. Pupils are equal, round, and reactive to light.  Neck: Normal range of motion. Neck supple. Carotid bruit is not present. No thyromegaly present.  Cardiovascular: Normal rate, regular rhythm, normal heart sounds and intact distal pulses.   Pulmonary/Chest: Effort normal and breath sounds normal. He has no wheezes. Right breast exhibits no mass. Left breast exhibits no mass.  Abdominal: Soft. Normal appearance and bowel sounds are normal. There is no  hepatosplenomegaly. There is no tenderness.  Musculoskeletal: He exhibits no edema or tenderness.  Lymphadenopathy:    He has no cervical adenopathy.  Neurological: He is alert and oriented to person,  place, and time. He has normal reflexes.  Skin: Skin is warm, dry and intact.  Multiple skin tags Scaly raised lesion on right cheek  Psychiatric: He has a normal mood and affect. His speech is normal and behavior is normal. Judgment and thought content normal.  Nursing note and vitals reviewed.   BP 122/84 (BP Location: Right Arm, Patient Position: Sitting, Cuff Size: Large)   Pulse 62   Ht 6\' 1"  (1.854 m)   Wt 232 lb (105.2 kg)   SpO2 98%   BMI 30.61 kg/m   Assessment and Plan: 1. Annual physical exam Continue to work on weight loss - Lipid panel - POCT urinalysis dipstick  2. Essential (primary) hypertension controlled - CBC with Differential/Platelet - Comprehensive metabolic panel - EKG 17-CBSW - SB @ 56; WNL - valsartan-hydrochlorothiazide (DIOVAN HCT) 320-25 MG tablet; Take 1 tablet by mouth daily.  Dispense: 30 tablet; Refill: 12 - amLODipine (NORVASC) 10 MG tablet; Take 1 tablet (10 mg total) by mouth daily.  Dispense: 30 tablet; Refill: 12  3. Plantar fasciitis improved  4. Prostate cancer screening DRE deferred - PSA  5. History of colon polyps Pt to schedule colonoscopy  6. Neoplasm of uncertain behavior of skin Consult Dermatology   Meds ordered this encounter  Medications  . valsartan-hydrochlorothiazide (DIOVAN HCT) 320-25 MG tablet    Sig: Take 1 tablet by mouth daily.    Dispense:  30 tablet    Refill:  12  . amLODipine (NORVASC) 10 MG tablet    Sig: Take 1 tablet (10 mg total) by mouth daily.    Dispense:  30 tablet    Refill:  [place], MD [facility]  02/20/2017

## 2017-02-21 LAB — CBC WITH DIFFERENTIAL/PLATELET
BASOS ABS: 0 10*3/uL (ref 0.0–0.2)
Basos: 0 %
EOS (ABSOLUTE): 0.2 10*3/uL (ref 0.0–0.4)
Eos: 2 %
Hematocrit: 40.9 % (ref 37.5–51.0)
Hemoglobin: 13.6 g/dL (ref 13.0–17.7)
IMMATURE GRANS (ABS): 0 10*3/uL (ref 0.0–0.1)
Immature Granulocytes: 0 %
LYMPHS: 32 %
Lymphocytes Absolute: 2.5 10*3/uL (ref 0.7–3.1)
MCH: 29.6 pg (ref 26.6–33.0)
MCHC: 33.3 g/dL (ref 31.5–35.7)
MCV: 89 fL (ref 79–97)
MONOCYTES: 9 %
Monocytes Absolute: 0.7 10*3/uL (ref 0.1–0.9)
NEUTROS PCT: 57 %
Neutrophils Absolute: 4.6 10*3/uL (ref 1.4–7.0)
PLATELETS: 260 10*3/uL (ref 150–379)
RBC: 4.59 x10E6/uL (ref 4.14–5.80)
RDW: 13.7 % (ref 12.3–15.4)
WBC: 8 10*3/uL (ref 3.4–10.8)

## 2017-02-21 LAB — COMPREHENSIVE METABOLIC PANEL
ALT: 23 IU/L (ref 0–44)
AST: 17 IU/L (ref 0–40)
Albumin/Globulin Ratio: 1.7 (ref 1.2–2.2)
Albumin: 4.5 g/dL (ref 3.5–5.5)
Alkaline Phosphatase: 52 IU/L (ref 39–117)
BUN/Creatinine Ratio: 15 (ref 9–20)
BUN: 15 mg/dL (ref 6–24)
Bilirubin Total: 0.4 mg/dL (ref 0.0–1.2)
CO2: 28 mmol/L (ref 18–29)
CREATININE: 1.02 mg/dL (ref 0.76–1.27)
Calcium: 9.5 mg/dL (ref 8.7–10.2)
Chloride: 99 mmol/L (ref 96–106)
GFR calc non Af Amer: 84 mL/min/{1.73_m2} (ref >59)
GFR, EST AFRICAN AMERICAN: 97 mL/min/{1.73_m2} (ref >59)
GLUCOSE: 92 mg/dL (ref 65–99)
Globulin, Total: 2.7 g/dL (ref 1.5–4.5)
POTASSIUM: 3.8 mmol/L (ref 3.5–5.2)
SODIUM: 140 mmol/L (ref 134–144)
Total Protein: 7.2 g/dL (ref 6.0–8.5)

## 2017-02-21 LAB — LIPID PANEL
CHOL/HDL RATIO: 4.5 ratio (ref 0.0–5.0)
Cholesterol, Total: 168 mg/dL (ref 100–199)
HDL: 37 mg/dL — AB (ref >39)
LDL CALC: 105 mg/dL — AB (ref 0–99)
TRIGLYCERIDES: 132 mg/dL (ref 0–149)
VLDL Cholesterol Cal: 26 mg/dL (ref 5–40)

## 2017-02-21 LAB — PSA: PROSTATE SPECIFIC AG, SERUM: 2.1 ng/mL (ref 0.0–4.0)

## 2017-05-10 ENCOUNTER — Other Ambulatory Visit: Payer: Self-pay | Admitting: Internal Medicine

## 2017-05-10 ENCOUNTER — Telehealth: Payer: Self-pay

## 2017-05-10 MED ORDER — OLMESARTAN MEDOXOMIL-HCTZ 40-25 MG PO TABS: 1.0000 | ORAL_TABLET | Freq: Every day | ORAL | 5 refills | Status: DC

## 2017-05-10 NOTE — Telephone Encounter (Signed)
Pt received call about valsartan Recall and requesting for new medication to be sent in to [facility] in [place]. Please Advise.

## 2017-12-11 ENCOUNTER — Other Ambulatory Visit: Payer: Self-pay

## 2017-12-11 MED ORDER — OLMESARTAN MEDOXOMIL-HCTZ 40-25 MG PO TABS: 1.0000 | ORAL_TABLET | Freq: Every day | ORAL | 0 refills | Status: DC

## 2017-12-11 NOTE — Progress Notes (Signed)
Patient called stating his Benicar medication is on back order with [facility]. Called and confirmed this is accurate. He asked if we could send this medication to [place] until [facility] carries the medication again. Called [facility] to verify and they do carry the medication. Sent the medication to the pharmacy and called to inform patient. He verbalized understanding.

## 2018-01-07 ENCOUNTER — Other Ambulatory Visit: Payer: Self-pay | Admitting: Internal Medicine

## 2018-02-17 ENCOUNTER — Other Ambulatory Visit: Payer: Self-pay | Admitting: Internal Medicine

## 2018-02-21 ENCOUNTER — Ambulatory Visit (INDEPENDENT_AMBULATORY_CARE_PROVIDER_SITE_OTHER): Payer: BC Managed Care – PPO | Admitting: Internal Medicine

## 2018-02-21 ENCOUNTER — Encounter: Payer: Self-pay | Admitting: Internal Medicine

## 2018-02-21 VITALS — BP 128/82 | HR 70 | Resp 14 | Ht 73.0 in | Wt 231.6 lb

## 2018-02-21 DIAGNOSIS — R351 Nocturia: Secondary | ICD-10-CM

## 2018-02-21 DIAGNOSIS — Z125 Encounter for screening for malignant neoplasm of prostate: Secondary | ICD-10-CM | POA: Diagnosis not present

## 2018-02-21 DIAGNOSIS — Z0000 Encounter for general adult medical examination without abnormal findings: Secondary | ICD-10-CM

## 2018-02-21 DIAGNOSIS — Z1211 Encounter for screening for malignant neoplasm of colon: Secondary | ICD-10-CM | POA: Diagnosis not present

## 2018-02-21 DIAGNOSIS — Z0001 Encounter for general adult medical examination with abnormal findings: Secondary | ICD-10-CM | POA: Diagnosis not present

## 2018-02-21 DIAGNOSIS — I10 Essential (primary) hypertension: Secondary | ICD-10-CM

## 2018-02-21 DIAGNOSIS — N401 Enlarged prostate with lower urinary tract symptoms: Secondary | ICD-10-CM | POA: Diagnosis not present

## 2018-02-21 LAB — POCT URINALYSIS DIPSTICK
BILIRUBIN UA: NEGATIVE
Blood, UA: NEGATIVE
Glucose, UA: NEGATIVE
Ketones, UA: NEGATIVE
Leukocytes, UA: NEGATIVE
NITRITE UA: NEGATIVE
PROTEIN UA: NEGATIVE
SPEC GRAV UA: 1.01 (ref 1.010–1.025)
UROBILINOGEN UA: 0.2 U/dL
pH, UA: 6 (ref 5.0–8.0)

## 2018-02-21 MED ORDER — AMLODIPINE BESYLATE 10 MG PO TABS: 10.0000 mg | ORAL_TABLET | Freq: Every day | ORAL | 12 refills | Status: DC

## 2018-02-21 NOTE — Progress Notes (Signed)
Date:  02/21/2018   Name:  John Hanna   DOB:  09/01/1961   MRN:  [id number]   Chief Complaint: Annual Exam John Hanna is a 57 y.o. male who presents today for his Complete Annual Exam. He feels well. He reports exercising doing chores and yardwork.Marland Kitchen He reports he is sleeping well. He is due for colonoscopy at [facility] - he will contact them to schedule.  Hypertension  Associated symptoms include shortness of breath (with extreme exertion). Pertinent negatives include no chest pain, headaches or palpitations. Past treatments include angiotensin blockers, calcium channel blockers and diuretics. The current treatment provides significant improvement.  Benign Prostatic Hypertrophy  This is a chronic problem. Irritative symptoms include frequency and nocturia. Irritative symptoms do not include urgency. Obstructive symptoms include a slower stream. Obstructive symptoms do not include dribbling or incomplete emptying. Pertinent negatives include no chills or dysuria. The symptoms are aggravated by diuretics. Past treatments include nothing (he does not feel that he needs any medication at this time).      Review of Systems  Constitutional: Negative for appetite change, chills, diaphoresis, fatigue and unexpected weight change.  HENT: Negative for hearing loss, tinnitus, trouble swallowing and voice change.   Eyes: Negative for visual disturbance.  Respiratory: Positive for shortness of breath (with extreme exertion). Negative for choking and wheezing.   Cardiovascular: Negative for chest pain, palpitations and leg swelling.  Gastrointestinal: Negative for abdominal pain, blood in stool, constipation and diarrhea.  Genitourinary: Positive for frequency and nocturia. Negative for difficulty urinating, discharge, dysuria, incomplete emptying and urgency.       Nocturia x 2  Musculoskeletal: Negative for arthralgias, back pain and myalgias.  Skin: Negative for color change and rash.   Skin lesions - followed by Derm  Neurological: Negative for dizziness, syncope and headaches.  Hematological: Negative for adenopathy.  Psychiatric/Behavioral: Negative for dysphoric mood and sleep disturbance.    Patient Active Problem List   Diagnosis Date Noted  . BPH associated with nocturia 02/21/2018  . Essential (primary) hypertension 07/26/2015  . History of colon polyps 07/26/2015  . H/O malignant neoplasm of skin 07/26/2015  . Plantar fasciitis 07/26/2015    Prior to Admission medications   Medication Sig Start Date End Date Taking? Authorizing Provider  amLODipine (NORVASC) 10 MG tablet Take 1 tablet (10 mg total) by mouth daily. 02/20/17  Yes [name], MD  meloxicam (MOBIC) 15 MG tablet TAKE ONE TABLET BY MOUTH ONCE DAILY 01/13/16  Yes [name], MD  olmesartan-hydrochlorothiazide (BENICAR HCT) 40-25 MG tablet TAKE 1 TABLET BY MOUTH ONCE DAILY 02/18/18  Yes [name], MD    No Known Allergies  Past Surgical History:  Procedure Laterality Date  . COLONOSCOPY  01/2011   repeat 3 yrs - @ [facility] internal medicine  . HERNIA REPAIR  1992    Social History   Tobacco Use  . Smoking status: Former Research scientist (life sciences)  . Smokeless tobacco: Never Used  Substance Use Topics  . Alcohol use: No    Alcohol/week: 0.0 oz  . Drug use: Not on file     Medication list has been reviewed and updated.  PHQ 2/9 Scores 02/21/2018 02/21/2016  PHQ - 2 Score 0 0    Physical Exam  Constitutional: He is oriented to person, place, and time. He appears well-developed and well-nourished.  HENT:  Head: Normocephalic.  Right Ear: Tympanic membrane, external ear and ear canal normal.  Left Ear: Tympanic membrane, external ear and ear  canal normal.  Nose: Nose normal.  Mouth/Throat: Uvula is midline and oropharynx is clear and moist.  Eyes: Pupils are equal, round, and reactive to light. Conjunctivae and EOM are normal.  Neck: Normal range of motion. Neck supple. Carotid  bruit is not present. No thyromegaly present.  Cardiovascular: Normal rate, regular rhythm, normal heart sounds and intact distal pulses.  Pulmonary/Chest: Effort normal and breath sounds normal. He has no wheezes. Right breast exhibits no mass. Left breast exhibits no mass.  Abdominal: Soft. Normal appearance and bowel sounds are normal. There is no hepatosplenomegaly. There is no tenderness.  Genitourinary: Rectum normal. Prostate is enlarged. Prostate is not tender.  Musculoskeletal: Normal range of motion.  Lymphadenopathy:    He has no cervical adenopathy.  Neurological: He is alert and oriented to person, place, and time. He has normal reflexes.  Skin: Skin is warm, dry and intact.  Psychiatric: He has a normal mood and affect. His speech is normal and behavior is normal. Judgment and thought content normal.  Nursing note and vitals reviewed.   BP 128/82   Pulse 70   Resp 14   Ht 6\' 1"  (1.854 m)   Wt 231 lb 9.6 oz (105.1 kg)   BMI 30.56 kg/m   Assessment and Plan: 1. Annual physical exam Encourage diet changes and regular exercise - Lipid panel - POCT urinalysis dipstick  2. Colon cancer screening Pt to contact [facility] Dr. Alain Marion to schedule  3. Prostate cancer screening - PSA  4. Essential (primary) hypertension controlled - CBC with Differential/Platelet - Comprehensive metabolic panel - amLODipine (NORVASC) 10 MG tablet; Take 1 tablet (10 mg total) by mouth daily.  Dispense: 30 tablet; Refill: 12  5. BPH associated with nocturia Pt declines medication at this time -   Meds ordered this encounter  Medications  . amLODipine (NORVASC) 10 MG tablet    Sig: Take 1 tablet (10 mg total) by mouth daily.    Dispense:  30 tablet    Refill:  12    Partially dictated using Editor, commissioning. Any errors are unintentional.  Halina Maidens, MD [facility]  02/21/2018

## 2018-02-21 NOTE — Patient Instructions (Addendum)
Coricidin HBP - for cold and sinus symptoms Health Maintenance, Male A healthy lifestyle and preventive care is important for your health and wellness. Ask your health care provider about what schedule of regular examinations is right for you. What should I know about weight and diet? Eat a Healthy Diet  Eat plenty of vegetables, fruits, whole grains, low-fat dairy products, and lean protein.  Do not eat a lot of foods high in solid fats, added sugars, or salt.  Maintain a Healthy Weight Regular exercise can help you achieve or maintain a healthy weight. You should:  Do at least 150 minutes of exercise each week. The exercise should increase your heart rate and make you sweat (moderate-intensity exercise).  Do strength-training exercises at least twice a week.  Watch Your Levels of Cholesterol and Blood Lipids  Have your blood tested for lipids and cholesterol every 5 years starting at 55 years of age. If you are at high risk for heart disease, you should start having your blood tested when you are 55 years old. You may need to have your cholesterol levels checked more often if: ? Your lipid or cholesterol levels are high. ? You are older than 55 years of age. ? You are at high risk for heart disease.  What should I know about cancer screening? Many types of cancers can be detected early and may often be prevented. Lung Cancer  You should be screened every year for lung cancer if: ? You are a current smoker who has smoked for at least 30 years. ? You are a former smoker who has quit within the past 15 years.  Talk to your health care provider about your screening options, when you should start screening, and how often you should be screened.  Colorectal Cancer  Routine colorectal cancer screening usually begins at 55 years of age and should be repeated every 5-10 years until you are 55 years old. You may need to be screened more often if early forms of precancerous polyps or small  growths are found. Your health care provider may recommend screening at an earlier age if you have risk factors for colon cancer.  Your health care provider may recommend using home test kits to check for hidden blood in the stool.  A small camera at the end of a tube can be used to examine your colon (sigmoidoscopy or colonoscopy). This checks for the earliest forms of colorectal cancer.  Prostate and Testicular Cancer  Depending on your age and overall health, your health care provider may do certain tests to screen for prostate and testicular cancer.  Talk to your health care provider about any symptoms or concerns you have about testicular or prostate cancer.  Skin Cancer  Check your skin from head to toe regularly.  Tell your health care provider about any new moles or changes in moles, especially if: ? There is a change in a mole's size, shape, or color. ? You have a mole that is larger than a pencil eraser.  Always use sunscreen. Apply sunscreen liberally and repeat throughout the day.  Protect yourself by wearing long sleeves, pants, a wide-brimmed hat, and sunglasses when outside.  What should I know about heart disease, diabetes, and high blood pressure?  If you are 38-33 years of age, have your blood pressure checked every 3-5 years. If you are 11 years of age or older, have your blood pressure checked every year. You should have your blood pressure measured twice-once when you are  at a hospital or clinic, and once when you are not at a hospital or clinic. Record the average of the two measurements. To check your blood pressure when you are not at a hospital or clinic, you can use: ? An automated blood pressure machine at a pharmacy. ? A home blood pressure monitor.  Talk to your health care provider about your target blood pressure.  If you are between 4-21 years old, ask your health care provider if you should take aspirin to prevent heart disease.  Have regular  diabetes screenings by checking your fasting blood sugar level. ? If you are at a normal weight and have a low risk for diabetes, have this test once every three years after the age of 70. ? If you are overweight and have a high risk for diabetes, consider being tested at a younger age or more often.  A one-time screening for abdominal aortic aneurysm (AAA) by ultrasound is recommended for men aged 32-75 years who are current or former smokers. What should I know about preventing infection? Hepatitis B If you have a higher risk for hepatitis B, you should be screened for this virus. Talk with your health care provider to find out if you are at risk for hepatitis B infection. Hepatitis C Blood testing is recommended for:  Everyone born from [birth year] through 1963.  Anyone with known risk factors for hepatitis C.  Sexually Transmitted Diseases (STDs)  You should be screened each year for STDs including gonorrhea and chlamydia if: ? You are sexually active and are younger than 55 years of age. ? You are older than 55 years of age and your health care provider tells you that you are at risk for this type of infection. ? Your sexual activity has changed since you were last screened and you are at an increased risk for chlamydia or gonorrhea. Ask your health care provider if you are at risk.  Talk with your health care provider about whether you are at high risk of being infected with HIV. Your health care provider may recommend a prescription medicine to help prevent HIV infection.  What else can I do?  Schedule regular health, dental, and eye exams.  Stay current with your vaccines (immunizations).  Do not use any tobacco products, such as cigarettes, chewing tobacco, and e-cigarettes. If you need help quitting, ask your health care provider.  Limit alcohol intake to no more than 2 drinks per day. One drink equals 12 ounces of beer, 5 ounces of wine, or 1 ounces of hard liquor.  Do not use  street drugs.  Do not share needles.  Ask your health care provider for help if you need support or information about quitting drugs.  Tell your health care provider if you often feel depressed.  Tell your health care provider if you have ever been abused or do not feel safe at home. This information is not intended to replace advice given to you by your health care provider. Make sure you discuss any questions you have with your health care provider. Document Released: 03/30/2008 Document Revised: 05/31/2016 Document Reviewed: 07/06/2015 Elsevier Interactive Patient Education  [employer].

## 2018-02-22 LAB — COMPREHENSIVE METABOLIC PANEL
ALBUMIN: 4.7 g/dL (ref 3.5–5.5)
ALK PHOS: 46 IU/L (ref 39–117)
ALT: 28 IU/L (ref 0–44)
AST: 18 IU/L (ref 0–40)
Albumin/Globulin Ratio: 1.7 (ref 1.2–2.2)
BUN / CREAT RATIO: 11 (ref 9–20)
BUN: 13 mg/dL (ref 6–24)
Bilirubin Total: 0.5 mg/dL (ref 0.0–1.2)
CALCIUM: 9.6 mg/dL (ref 8.7–10.2)
CO2: 26 mmol/L (ref 20–29)
CREATININE: 1.18 mg/dL (ref 0.76–1.27)
Chloride: 98 mmol/L (ref 96–106)
GFR, EST AFRICAN AMERICAN: 80 mL/min/{1.73_m2} (ref >59)
GFR, EST NON AFRICAN AMERICAN: 70 mL/min/{1.73_m2} (ref >59)
Globulin, Total: 2.8 g/dL (ref 1.5–4.5)
Glucose: 104 mg/dL — ABNORMAL HIGH (ref 65–99)
Potassium: 4 mmol/L (ref 3.5–5.2)
Sodium: 140 mmol/L (ref 134–144)
TOTAL PROTEIN: 7.5 g/dL (ref 6.0–8.5)

## 2018-02-22 LAB — LIPID PANEL
Chol/HDL Ratio: 4.7 ratio (ref 0.0–5.0)
Cholesterol, Total: 186 mg/dL (ref 100–199)
HDL: 40 mg/dL (ref >39)
LDL Calculated: 121 mg/dL — ABNORMAL HIGH (ref 0–99)
TRIGLYCERIDES: 123 mg/dL (ref 0–149)
VLDL CHOLESTEROL CAL: 25 mg/dL (ref 5–40)

## 2018-02-22 LAB — PSA: Prostate Specific Ag, Serum: 2 ng/mL (ref 0.0–4.0)

## 2018-02-22 LAB — CBC WITH DIFFERENTIAL/PLATELET
Basophils Absolute: 0 10*3/uL (ref 0.0–0.2)
Basos: 0 %
EOS (ABSOLUTE): 0.1 10*3/uL (ref 0.0–0.4)
EOS: 2 %
HEMATOCRIT: 44.4 % (ref 37.5–51.0)
HEMOGLOBIN: 14.5 g/dL (ref 13.0–17.7)
Immature Grans (Abs): 0 10*3/uL (ref 0.0–0.1)
Immature Granulocytes: 0 %
LYMPHS ABS: 1.8 10*3/uL (ref 0.7–3.1)
Lymphs: 23 %
MCH: 29.8 pg (ref 26.6–33.0)
MCHC: 32.7 g/dL (ref 31.5–35.7)
MCV: 91 fL (ref 79–97)
MONOCYTES: 13 %
Monocytes Absolute: 1 10*3/uL — ABNORMAL HIGH (ref 0.1–0.9)
Neutrophils Absolute: 4.7 10*3/uL (ref 1.4–7.0)
Neutrophils: 62 %
Platelets: 257 10*3/uL (ref 150–379)
RBC: 4.87 x10E6/uL (ref 4.14–5.80)
RDW: 14 % (ref 12.3–15.4)
WBC: 7.8 10*3/uL (ref 3.4–10.8)

## 2018-08-06 ENCOUNTER — Ambulatory Visit (INDEPENDENT_AMBULATORY_CARE_PROVIDER_SITE_OTHER): Payer: BC Managed Care – PPO | Admitting: Internal Medicine

## 2018-08-06 ENCOUNTER — Ambulatory Visit: Payer: Self-pay | Admitting: Internal Medicine

## 2018-08-06 ENCOUNTER — Encounter: Payer: Self-pay | Admitting: Internal Medicine

## 2018-08-06 ENCOUNTER — Ambulatory Visit
Admission: RE | Admit: 2018-08-06 | Discharge: 2018-08-06 | Disposition: A | Discharge status: Home / Self Care | Payer: BC Managed Care – PPO | Source: Ambulatory Visit | Attending: Internal Medicine | Admitting: Internal Medicine

## 2018-08-06 VITALS — BP 112/60 | HR 70 | Ht 73.0 in | Wt 231.0 lb

## 2018-08-06 DIAGNOSIS — M5440 Lumbago with sciatica, unspecified side: Secondary | ICD-10-CM

## 2018-08-06 DIAGNOSIS — I10 Essential (primary) hypertension: Secondary | ICD-10-CM | POA: Diagnosis not present

## 2018-08-06 DIAGNOSIS — M5136 Other intervertebral disc degeneration, lumbar region: Secondary | ICD-10-CM | POA: Insufficient documentation

## 2018-08-06 MED ORDER — OLMESARTAN MEDOXOMIL-HCTZ 40-25 MG PO TABS: 1.0000 | ORAL_TABLET | Freq: Every day | ORAL | 5 refills | Status: DC

## 2018-08-06 MED ORDER — PREDNISONE 10 MG PO TABS: 10.0000 mg | ORAL_TABLET | ORAL | 0 refills | Status: AC

## 2018-08-06 MED ORDER — CYCLOBENZAPRINE HCL 10 MG PO TABS: 10.0000 mg | ORAL_TABLET | Freq: Every day | ORAL | 0 refills | Status: DC

## 2018-08-06 NOTE — Progress Notes (Signed)
Date:  08/06/2018   Name:  John Hanna   DOB:  [birth year]   MRN:  [id number]   Chief Complaint: Back Pain (Lower back pain. Started last year. Seen Chiropracter and was straightened out. Pain is radiating down into right leg. Seen Chiropracter in August and still not any better. )  Back Pain  This is a chronic problem. The current episode started more than 1 year ago. The pain is present in the lumbar spine. The pain radiates to the right thigh. The pain is mild. The symptoms are aggravated by bending and standing. Associated symptoms include numbness and tingling. Pertinent negatives include no abdominal pain, bladder incontinence, bowel incontinence, chest pain, fever, headaches or weakness. He has tried chiropractic manipulation, analgesics and ice for the symptoms. The treatment provided moderate relief.    Review of Systems  Constitutional: Negative for chills, fatigue and fever.  Respiratory: Negative for chest tightness and shortness of breath.   Cardiovascular: Negative for chest pain and palpitations.  Gastrointestinal: Negative for abdominal pain and bowel incontinence.  Genitourinary: Negative for bladder incontinence and difficulty urinating.  Musculoskeletal: Positive for back pain. Negative for joint swelling and myalgias.  Neurological: Positive for tingling and numbness. Negative for dizziness, tremors, weakness and headaches.  Psychiatric/Behavioral: Negative for dysphoric mood and sleep disturbance.    Patient Active Problem List   Diagnosis Date Noted  . BPH associated with nocturia 02/21/2018  . Essential (primary) hypertension 07/26/2015  . History of colon polyps 07/26/2015  . H/O malignant neoplasm of skin 07/26/2015  . Plantar fasciitis 07/26/2015    No Known Allergies  Past Surgical History:  Procedure Laterality Date  . COLONOSCOPY  01/2011   repeat 3 yrs - @ [facility] internal medicine  . HERNIA REPAIR  1992    Social History   Tobacco  Use  . Smoking status: Former Research scientist (life sciences)  . Smokeless tobacco: Never Used  Substance Use Topics  . Alcohol use: No    Alcohol/week: 0.0 standard drinks  . Drug use: Not on file     Medication list has been reviewed and updated.  Current Meds  Medication Sig  . amLODipine (NORVASC) 10 MG tablet Take 1 tablet (10 mg total) by mouth daily.  Marland Kitchen olmesartan-hydrochlorothiazide (BENICAR HCT) 40-25 MG tablet TAKE 1 TABLET BY MOUTH ONCE DAILY (Patient taking differently: 1 tablet. )    PHQ 2/9 Scores 02/21/2018 02/21/2016  PHQ - 2 Score 0 0    Physical Exam  Constitutional: He is oriented to person, place, and time. He appears well-developed. No distress.  HENT:  Head: Normocephalic and atraumatic.  Pulmonary/Chest: Effort normal. No respiratory distress.  Musculoskeletal:       Right hip: He exhibits decreased range of motion.       Left hip: Normal.       Lumbar back: He exhibits tenderness and spasm.  Neurological: He is alert and oriented to person, place, and time. He has normal strength. No sensory deficit. Gait normal.  Reflex Scores:      Patellar reflexes are 2+ on the right side and 2+ on the left side.      Achilles reflexes are 2+ on the right side and 2+ on the left side. SLR positive on right, negative on left  Skin: Skin is warm and dry. No rash noted.  Psychiatric: He has a normal mood and affect. His behavior is normal. Thought content normal.  Nursing note and vitals reviewed.   BP 112/60 (BP Location: Right  Arm, Patient Position: Sitting, Cuff Size: Large)   Pulse 70   Ht 6\' 1"  (1.854 m)   Wt 231 lb (104.8 kg)   SpO2 98%   BMI 30.48 kg/m   Assessment and Plan: 1. Back pain of lumbar region with sciatica May need Ortho - DG Lumbar Spine Complete; Future - predniSONE (DELTASONE) 10 MG tablet; Take 1 tablet (10 mg total) by mouth as directed for 6 days. Take 6,5,4,3,2,1 then stop  Dispense: 21 tablet; Refill: 0 - cyclobenzaprine (FLEXERIL) 10 MG tablet; Take 1  tablet (10 mg total) by mouth at bedtime.  Dispense: 30 tablet; Refill: 0  2. Essential (primary) hypertension controlled - olmesartan-hydrochlorothiazide (BENICAR HCT) 40-25 MG tablet; Take 1 tablet by mouth daily.  Dispense: 30 tablet; Refill: 5   Partially dictated using Editor, commissioning. Any errors are unintentional.  Halina Maidens, MD [facility]  08/06/2018

## 2018-08-19 ENCOUNTER — Encounter: Payer: Self-pay | Admitting: Internal Medicine

## 2018-08-19 ENCOUNTER — Encounter: Payer: Self-pay | Admitting: Emergency Medicine

## 2018-08-19 ENCOUNTER — Emergency Department
Admission: EM | Admit: 2018-08-19 | Discharge: 2018-08-19 | Disposition: A | Discharge status: Home / Self Care | Payer: BC Managed Care – PPO | Attending: Emergency Medicine | Admitting: Emergency Medicine

## 2018-08-19 ENCOUNTER — Ambulatory Visit (INDEPENDENT_AMBULATORY_CARE_PROVIDER_SITE_OTHER): Payer: BC Managed Care – PPO | Admitting: Internal Medicine

## 2018-08-19 VITALS — BP 140/84 | HR 104 | Ht 73.0 in | Wt 238.0 lb

## 2018-08-19 DIAGNOSIS — I10 Essential (primary) hypertension: Secondary | ICD-10-CM | POA: Insufficient documentation

## 2018-08-19 DIAGNOSIS — R338 Other retention of urine: Secondary | ICD-10-CM | POA: Diagnosis not present

## 2018-08-19 DIAGNOSIS — R350 Frequency of micturition: Secondary | ICD-10-CM | POA: Insufficient documentation

## 2018-08-19 DIAGNOSIS — Z85828 Personal history of other malignant neoplasm of skin: Secondary | ICD-10-CM | POA: Diagnosis not present

## 2018-08-19 DIAGNOSIS — R3911 Hesitancy of micturition: Secondary | ICD-10-CM | POA: Diagnosis not present

## 2018-08-19 DIAGNOSIS — Z87891 Personal history of nicotine dependence: Secondary | ICD-10-CM | POA: Insufficient documentation

## 2018-08-19 DIAGNOSIS — R300 Dysuria: Secondary | ICD-10-CM | POA: Diagnosis not present

## 2018-08-19 DIAGNOSIS — R339 Retention of urine, unspecified: Secondary | ICD-10-CM | POA: Diagnosis present

## 2018-08-19 HISTORY — DX: Essential (primary) hypertension: I10

## 2018-08-19 LAB — POCT URINALYSIS DIPSTICK
Bilirubin, UA: NEGATIVE
Glucose, UA: NEGATIVE
Ketones, UA: NEGATIVE
LEUKOCYTES UA: NEGATIVE
NITRITE UA: NEGATIVE
PROTEIN UA: NEGATIVE
Spec Grav, UA: 1.01 (ref 1.010–1.025)
Urobilinogen, UA: 0.2 E.U./dL
pH, UA: 6 (ref 5.0–8.0)

## 2018-08-19 LAB — BASIC METABOLIC PANEL
Anion gap: 10 (ref 5–15)
BUN: 13 mg/dL (ref 6–20)
CHLORIDE: 101 mmol/L (ref 98–111)
CO2: 26 mmol/L (ref 22–32)
CREATININE: 1.23 mg/dL (ref 0.61–1.24)
Calcium: 9.8 mg/dL (ref 8.9–10.3)
GFR calc non Af Amer: >60 mL/min (ref >60)
Glucose, Bld: 135 mg/dL — ABNORMAL HIGH (ref 70–99)
POTASSIUM: 4.2 mmol/L (ref 3.5–5.1)
SODIUM: 137 mmol/L (ref 135–145)

## 2018-08-19 LAB — URINALYSIS, COMPLETE (UACMP) WITH MICROSCOPIC
BACTERIA UA: NONE SEEN
BILIRUBIN URINE: NEGATIVE
Glucose, UA: NEGATIVE mg/dL
Ketones, ur: NEGATIVE mg/dL
Leukocytes, UA: NEGATIVE
NITRITE: NEGATIVE
Protein, ur: NEGATIVE mg/dL
SPECIFIC GRAVITY, URINE: 1.006 (ref 1.005–1.030)
pH: 6 (ref 5.0–8.0)

## 2018-08-19 LAB — CBC
HEMATOCRIT: 43.9 % (ref 39.0–52.0)
Hemoglobin: 14.4 g/dL (ref 13.0–17.0)
MCH: 29.9 pg (ref 26.0–34.0)
MCHC: 32.8 g/dL (ref 30.0–36.0)
MCV: 91.1 fL (ref 80.0–100.0)
Platelets: 254 10*3/uL (ref 150–400)
RBC: 4.82 MIL/uL (ref 4.22–5.81)
RDW: 12.4 % (ref 11.5–15.5)
WBC: 13.3 10*3/uL — AB (ref 4.0–10.5)
nRBC: 0 % (ref 0.0–0.2)

## 2018-08-19 MED ORDER — TAMSULOSIN HCL 0.4 MG PO CAPS: 0.4000 mg | ORAL_CAPSULE | Freq: Every day | ORAL | 0 refills | Status: DC

## 2018-08-19 NOTE — ED Notes (Signed)
ED Provider at bedside. 

## 2018-08-19 NOTE — ED Triage Notes (Signed)
Cant urinate since 4am

## 2018-08-19 NOTE — ED Notes (Signed)
Leg bag placed on patient.

## 2018-08-19 NOTE — ED Notes (Signed)
AAOx3.  Skin warm and dry.  NAD 

## 2018-08-19 NOTE — ED Triage Notes (Signed)
Pt reports has had trouble making urine over the past 24 hours. Pt states last urinated at 0300 this am and is extremely unconformable. Pt reports yesterday he was urinating frequently and had some dysuria.

## 2018-08-19 NOTE — Progress Notes (Signed)
    Date:  08/19/2018   Name:  John Hanna   DOB:  1961/03/03   MRN:  [id number]   Chief Complaint: Urinary Retention (Started this morning at 4:15. Burning when urinating. When going only having a few "dribbles."  In a lot of pain. ) He has voiding only very small amount twice despite drinking 3-4 cups of juice and water.  He is very uncomfortable with some burning with urination.  No testicular pain or swelling.  He has had some diarrhea. He has been taking flexeril nightly for the past 10 days.  He has not taken more than prescribed.  HPI  Review of Systems  Constitutional: Negative for chills, fatigue and fever.  Respiratory: Negative for chest tightness, shortness of breath and wheezing.   Cardiovascular: Negative for chest pain.  Genitourinary: Positive for difficulty urinating and dysuria.  Neurological: Negative for dizziness and headaches.    Patient Active Problem List   Diagnosis Date Noted  . BPH associated with nocturia 02/21/2018  . Essential (primary) hypertension 07/26/2015  . History of colon polyps 07/26/2015  . H/O malignant neoplasm of skin 07/26/2015  . Plantar fasciitis 07/26/2015    No Known Allergies  Past Surgical History:  Procedure Laterality Date  . COLONOSCOPY  01/2011   repeat 3 yrs - @ [facility] internal medicine  . HERNIA REPAIR  1992    Social History   Tobacco Use  . Smoking status: Former Research scientist (life sciences)  . Smokeless tobacco: Never Used  Substance Use Topics  . Alcohol use: No    Alcohol/week: 0.0 standard drinks  . Drug use: Not on file     Medication list has been reviewed and updated.  Current Meds  Medication Sig  . amLODipine (NORVASC) 10 MG tablet Take 1 tablet (10 mg total) by mouth daily.  . cyclobenzaprine (FLEXERIL) 10 MG tablet Take 1 tablet (10 mg total) by mouth at bedtime.  . meloxicam (MOBIC) 15 MG tablet TAKE ONE TABLET BY MOUTH ONCE DAILY  . olmesartan-hydrochlorothiazide (BENICAR HCT) 40-25 MG tablet Take 1  tablet by mouth daily.    PHQ 2/9 Scores 02/21/2018 02/21/2016  PHQ - 2 Score 0 0    Physical Exam  Constitutional: He appears well-developed. He appears distressed.  Cardiovascular: Normal rate, regular rhythm and normal heart sounds.  Pulmonary/Chest: Effort normal and breath sounds normal.  Abdominal: There is tenderness in the suprapubic area.   Urine dipstick shows positive for RBC's.  Micro exam: not done.  BP 140/84 (BP Location: Right Arm, Patient Position: Sitting, Cuff Size: Large)   Pulse (!) 104   Ht 6\' 1"  (1.854 m)   Wt 238 lb (108 kg)   SpO2 98%   BMI 31.40 kg/m   Assessment and Plan: 1. Urinary hesitancy Pt has had 3-4 cups of fluid this am and only voided 10 cc. Stop Flexeril Advised to go to ED - POCT urinalysis dipstick   Partially dictated using Dragon software. Any errors are unintentional.  Halina Maidens, MD [facility]  08/19/2018

## 2018-08-19 NOTE — ED Provider Notes (Signed)
[facility] Emergency Department Provider Note       Time seen: ----------------------------------------- 2:19 PM on 08/19/2018 -----------------------------------------   I have reviewed the triage vital signs and the nursing notes.  HISTORY   Chief Complaint Urinary Retention; Urinary Frequency; and Dysuria    HPI John Hanna is a 57 y.o. male with a history of hypertension, BPH, skin cancer who presents to the ED for near retention.  Patient reports having difficulty making urine for the past 24 hours.  Patient states he last urinated at 3 AM and is extremely uncomfortable.  He reports yesterday he had urinated frequently had some dysuria.  He denies fevers, chills or other complaints.  Past Medical History:  Diagnosis Date  . Hypertension     Patient Active Problem List   Diagnosis Date Noted  . BPH associated with nocturia 02/21/2018  . Essential (primary) hypertension 07/26/2015  . History of colon polyps 07/26/2015  . H/O malignant neoplasm of skin 07/26/2015  . Plantar fasciitis 07/26/2015    Past Surgical History:  Procedure Laterality Date  . COLONOSCOPY  01/2011   repeat 3 yrs - @ [facility] internal medicine  . HERNIA REPAIR  1992    Allergies Patient has no known allergies.  Social History Social History   Tobacco Use  . Smoking status: Former Research scientist (life sciences)  . Smokeless tobacco: Never Used  Substance Use Topics  . Alcohol use: No    Alcohol/week: 0.0 standard drinks  . Drug use: Not on file   Review of Systems Constitutional: Negative for fever. Cardiovascular: Negative for chest pain. Respiratory: Negative for shortness of breath. Gastrointestinal: Negative for abdominal pain, vomiting and diarrhea. Genitourinary: Positive for dysuria, oliguria Musculoskeletal: Negative for back pain. Skin: Negative for rash. Neurological: Negative for headaches, focal weakness or numbness.  All systems negative/normal/unremarkable  except as stated in the HPI  ____________________________________________   PHYSICAL EXAM:  VITAL SIGNS: ED Triage Vitals  Enc Vitals Group     BP 08/19/18 1059 (!) 157/92     Pulse Rate 08/19/18 1059 (!) 109     Resp 08/19/18 1059 16     Temp 08/19/18 1059 98.5 F (36.9 C)     Temp Source 08/19/18 1059 Oral     SpO2 08/19/18 1059 98 %     Weight --      Height --      Head Circumference --      Peak Flow --      Pain Score 08/19/18 1102 9     Pain Loc --      Pain Edu? --      Excl. in [place]? --    Constitutional: Alert and oriented. Well appearing and in no distress. Cardiovascular: Normal rate, regular rhythm. No murmurs, rubs, or gallops. Respiratory: Normal respiratory effort without tachypnea nor retractions. Breath sounds are clear and equal bilaterally. No wheezes/rales/rhonchi. Gastrointestinal: Soft and nontender. Normal bowel sounds Genitourinary: Musculoskeletal: Nontender with normal range of motion in extremities. No lower extremity tenderness nor edema. Neurologic:  Normal speech and language. No gross focal neurologic deficits are appreciated.  Skin:  Skin is warm, dry and intact. No rash noted. Psychiatric: Mood and affect are normal. Speech and behavior are normal.  ____________________________________________  ED COURSE:  As part of my medical decision making, I reviewed the following data within the [facility] History obtained from family if available, nursing notes, old chart and ekg, as well as notes from prior ED visits. Patient presented  for urinary retention, he was found to be retaining urine and so a Foley catheter was inserted.  We will assess with basic labs at this time. Procedures ____________________________________________   LABS (pertinent positives/negatives)  Labs Reviewed  BASIC METABOLIC PANEL - Abnormal; Notable for the following components:      Result Value   Glucose, Bld 135 (*)    All other components within  normal limits  CBC - Abnormal; Notable for the following components:   WBC 13.3 (*)    All other components within normal limits  URINALYSIS, COMPLETE (UACMP) WITH MICROSCOPIC  ____________________________________________  DIFFERENTIAL DIAGNOSIS   Urinary retention, BPH, UTI, renal insufficiency  FINAL ASSESSMENT AND PLAN  Acute urinary retention   Plan: The patient had presented for urinary retention. Patient's labs are unremarkable.  Function was normal, leukocytosis was likely related to urinary retention.  There is no active sign of infection and we will send a urine culture.  He is cleared for outpatient follow-up with urology.   Laurence Aly, MD   Note: This note was generated in part or whole with voice recognition software. Voice recognition is usually quite accurate but there are transcription errors that can and very often do occur. I apologize for any typographical errors that were not detected and corrected.    Earleen Newport, MD 08/19/18 [phone]

## 2018-08-19 NOTE — ED Notes (Signed)
Urine Volume 339. Foley was inserted by FM and MG.

## 2018-08-20 LAB — URINE CULTURE: Culture: NO GROWTH

## 2018-08-29 ENCOUNTER — Encounter

## 2018-08-29 ENCOUNTER — Ambulatory Visit (INDEPENDENT_AMBULATORY_CARE_PROVIDER_SITE_OTHER): Payer: BC Managed Care – PPO | Admitting: Urology

## 2018-08-29 ENCOUNTER — Encounter: Payer: Self-pay | Admitting: Urology

## 2018-08-29 VITALS — BP 130/83 | HR 89 | Ht 73.0 in | Wt 236.0 lb

## 2018-08-29 DIAGNOSIS — N401 Enlarged prostate with lower urinary tract symptoms: Secondary | ICD-10-CM

## 2018-08-29 DIAGNOSIS — N138 Other obstructive and reflux uropathy: Secondary | ICD-10-CM | POA: Diagnosis not present

## 2018-08-29 DIAGNOSIS — R339 Retention of urine, unspecified: Secondary | ICD-10-CM

## 2018-08-29 LAB — BLADDER SCAN AMB NON-IMAGING: Scan Result: 76

## 2018-08-29 MED ORDER — CIPROFLOXACIN HCL 500 MG PO TABS
500.0000 mg | ORAL_TABLET | Freq: Once | ORAL | Status: AC
  Administered 2018-08-29: 500 mg via ORAL

## 2018-08-29 NOTE — Progress Notes (Signed)
Catheter Removal  Patient is present today for a catheter removal.  17ml of water was drained from the balloon. A 16FR foley cath was removed from the bladder no complications were noted . Patient tolerated well. Patient to RTC at 2pm for bladder scan.  Preformed by: Gordy Clement, CMA  Follow up/ Additional notes: As scheduled

## 2018-08-29 NOTE — Progress Notes (Signed)
08/29/2018 8:44 AM   John Hanna 04-27-1961 [id number]  Referring provider: [name], MD [address] [place], [place] [address]  Chief Complaint  Patient presents with  . Hospitalization Follow-up    HPI: 57 year old male presented to the ED on 08/19/2018 with acute urinary retention.  He was seen his PCP for low back pain and sciatica and started Flexeril which he thinks triggered his urinary retention.  A Foley catheter was placed and he presents today for follow-up.  He had one other episode of urinary retention several years ago related to an over-the-counter decongestant.  He was started on tamsulosin.  Prior to this most recent episode he had no bothersome baseline lower urinary tract symptoms.  He had mild urinary hesitancy at times.  Denies dysuria or gross hematuria.  Denies flank, abdominal, pelvic or scrotal pain.  A PSA May 2019 was 2.0.   PMH: Past Medical History:  Diagnosis Date  . Arthritis   . Hypertension     Surgical History: Past Surgical History:  Procedure Laterality Date  . COLONOSCOPY  01/2011   repeat 3 yrs - @ [facility] internal medicine  . HERNIA REPAIR  1992    Home Medications:  Allergies as of 08/29/2018   No Known Allergies     Medication List        Accurate as of 08/29/18  8:44 AM. Always use your most recent med list.          amLODipine 10 MG tablet Commonly known as:  NORVASC Take 1 tablet (10 mg total) by mouth daily.   cyclobenzaprine 10 MG tablet Commonly known as:  FLEXERIL Take 1 tablet (10 mg total) by mouth at bedtime.   meloxicam 15 MG tablet Commonly known as:  MOBIC TAKE ONE TABLET BY MOUTH ONCE DAILY   olmesartan-hydrochlorothiazide 40-25 MG tablet Commonly known as:  BENICAR HCT Take 1 tablet by mouth daily.   tamsulosin 0.4 MG Caps capsule Commonly known as:  FLOMAX Take 1 capsule (0.4 mg total) by mouth daily after breakfast.       Allergies: No Known Allergies  Family  History: Family History  Problem Relation Age of Onset  . Colon cancer Mother        died age 64  . Hypertension Father   . Prostate cancer Father 3    Social History:  reports that he quit smoking about 30 years ago. His smoking use included cigarettes. He has a 2.50 pack-year smoking history. He has never used smokeless tobacco. He reports that he does not drink alcohol or use drugs.  ROS: UROLOGY Frequent Urination?: No Hard to postpone urination?: No Burning/pain with urination?: No Get up at night to urinate?: No Leakage of urine?: No Urine stream starts and stops?: Yes Trouble starting stream?: Yes Do you have to strain to urinate?: No Blood in urine?: No Urinary tract infection?: No Sexually transmitted disease?: No Injury to kidneys or bladder?: No Painful intercourse?: No Weak stream?: Yes Erection problems?: No Penile pain?: No  Gastrointestinal Nausea?: No Vomiting?: No Indigestion/heartburn?: No Diarrhea?: No Constipation?: No  Constitutional Fever: No Night sweats?: No Weight loss?: No Fatigue?: No  Skin Skin rash/lesions?: No Itching?: No  Eyes Blurred vision?: No Double vision?: No  Ears/Nose/Throat Sore throat?: No Sinus problems?: No  Hematologic/Lymphatic Swollen glands?: No Easy bruising?: No  Cardiovascular Leg swelling?: No Chest pain?: No  Respiratory Cough?: No Shortness of breath?: No  Endocrine Excessive thirst?: No  Musculoskeletal Back pain?:  Yes Joint pain?: No  Neurological Headaches?: No Dizziness?: No  Psychologic Depression?: No Anxiety?: No  Physical Exam: BP 130/83 (BP Location: Left Arm, Patient Position: Sitting, Cuff Size: Large)   Pulse 89   Ht 6\' 1"  (1.854 m)   Wt 236 lb (107 kg)   BMI 31.14 kg/m   Constitutional:  Alert and oriented, No acute distress. HEENT: [place] AT, moist mucus membranes.  Trachea midline, no masses. Cardiovascular: No clubbing, cyanosis, or edema. Respiratory: Normal  respiratory effort, no increased work of breathing. GI: Abdomen is soft, nontender, nondistended, no abdominal masses GU: No CVA tenderness.  Prostate 35 g, smooth without nodules. Lymph: No cervical or inguinal lymphadenopathy. Skin: No rashes, bruises or suspicious lesions. Neurologic: Grossly intact, no focal deficits, moving all 4 extremities. Psychiatric: Normal mood and affect.   Assessment & Plan:   57 year old male with acute urinary retention.  His Foley catheter has been indwelling x10 days.  His catheter was removed.  He received Cipro 500 mg p.o. x1 prior to catheter removal.  He will return later this afternoon for bladder scan for PVR.  Would recommend a 4 to 6-week follow-up for symptom check, IPSS and bladder scan.  He return the afternoon after catheter removal and PVR by bladder scan was 76 mL.   Abbie Sons, [place] [address], [place], [place] [address] [phone]

## 2018-09-02 ENCOUNTER — Encounter: Payer: Self-pay | Admitting: Urology

## 2018-09-02 ENCOUNTER — Other Ambulatory Visit: Payer: Self-pay | Admitting: Internal Medicine

## 2019-02-14 ENCOUNTER — Other Ambulatory Visit: Payer: Self-pay | Admitting: Internal Medicine

## 2019-02-14 DIAGNOSIS — I10 Essential (primary) hypertension: Secondary | ICD-10-CM

## 2019-02-21 ENCOUNTER — Other Ambulatory Visit: Payer: Self-pay

## 2019-02-21 ENCOUNTER — Other Ambulatory Visit: Payer: Self-pay | Admitting: Internal Medicine

## 2019-02-21 DIAGNOSIS — I10 Essential (primary) hypertension: Secondary | ICD-10-CM

## 2019-02-21 MED ORDER — VALSARTAN-HYDROCHLOROTHIAZIDE 320-25 MG PO TABS: 1.0000 | ORAL_TABLET | Freq: Every day | ORAL | 1 refills | Status: DC

## 2019-02-25 ENCOUNTER — Ambulatory Visit (INDEPENDENT_AMBULATORY_CARE_PROVIDER_SITE_OTHER): Payer: BC Managed Care – PPO | Admitting: Internal Medicine

## 2019-02-25 ENCOUNTER — Other Ambulatory Visit: Payer: Self-pay

## 2019-02-25 ENCOUNTER — Encounter: Payer: Self-pay | Admitting: Internal Medicine

## 2019-02-25 VITALS — BP 124/78 | HR 61 | Ht 73.0 in | Wt 240.0 lb

## 2019-02-25 DIAGNOSIS — I10 Essential (primary) hypertension: Secondary | ICD-10-CM

## 2019-02-25 DIAGNOSIS — Z1211 Encounter for screening for malignant neoplasm of colon: Secondary | ICD-10-CM

## 2019-02-25 DIAGNOSIS — M545 Low back pain, unspecified: Secondary | ICD-10-CM

## 2019-02-25 DIAGNOSIS — G8929 Other chronic pain: Secondary | ICD-10-CM

## 2019-02-25 DIAGNOSIS — Z0000 Encounter for general adult medical examination without abnormal findings: Secondary | ICD-10-CM | POA: Diagnosis not present

## 2019-02-25 DIAGNOSIS — N401 Enlarged prostate with lower urinary tract symptoms: Secondary | ICD-10-CM

## 2019-02-25 DIAGNOSIS — R351 Nocturia: Secondary | ICD-10-CM

## 2019-02-25 LAB — POCT URINALYSIS DIPSTICK
Blood, UA: NEGATIVE
Glucose, UA: NEGATIVE
Ketones, UA: NEGATIVE
Leukocytes, UA: NEGATIVE
Nitrite, UA: NEGATIVE
Protein, UA: NEGATIVE
Spec Grav, UA: 1.02 (ref 1.010–1.025)
Urobilinogen, UA: 0.2 E.U./dL
pH, UA: 5 (ref 5.0–8.0)

## 2019-02-25 MED ORDER — AMLODIPINE BESYLATE 10 MG PO TABS: 10.0000 mg | ORAL_TABLET | Freq: Every day | ORAL | 12 refills | Status: DC

## 2019-02-25 NOTE — Progress Notes (Signed)
Date:  02/25/2019   Name:  John Hanna   DOB:  06-10-1961   MRN:  [id number]   Chief Complaint: Annual Exam Cejay Cambre is a 58 y.o. male who presents today for his Complete Annual Exam. He feels well. He reports exercising none. He reports he is sleeping well.  Colonoscopy: due from [facility] 2014 Pt declines Shingrix  Hypertension  This is a chronic problem. The problem is controlled. Pertinent negatives include no chest pain, headaches, palpitations or shortness of breath.    Review of Systems  Constitutional: Negative for appetite change, chills, diaphoresis, fatigue and unexpected weight change.  HENT: Negative for hearing loss, tinnitus, trouble swallowing and voice change.   Eyes: Negative for visual disturbance.  Respiratory: Negative for choking, shortness of breath and wheezing.   Cardiovascular: Negative for chest pain, palpitations and leg swelling.  Gastrointestinal: Negative for abdominal pain, blood in stool, constipation and diarrhea.  Genitourinary: Negative for difficulty urinating, dysuria and frequency.  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 Negative for arthralgias and myalgias.  Skin: Negative for color change and rash.  Allergic/Immunologic: Negative for environmental allergies.  Neurological: Negative for dizziness, syncope and headaches.  Hematological: Negative for adenopathy.  Psychiatric/Behavioral: Negative for dysphoric mood and sleep disturbance.    Patient Active Problem List   Diagnosis Date Noted  . BPH associated with nocturia 02/21/2018  . Essential (primary) hypertension 07/26/2015  . History of colon polyps 07/26/2015  . H/O malignant neoplasm of skin 07/26/2015  . Plantar fasciitis 07/26/2015    Allergies  Allergen Reactions  . Cyclobenzaprine Other (See Comments)    Acute urinary retention    Past Surgical History:  Procedure Laterality Date  . COLONOSCOPY  01/2011   repeat 3  yrs - @ [facility] internal medicine  . HERNIA REPAIR  1992    Social History   Tobacco Use  . Smoking status: Former Smoker    Packs/day: 0.50    Years: 5.00    Pack years: 2.50    Types: Cigarettes    Last attempt to quit: 10/17/1987    Years since quitting: 31.3  . Smokeless tobacco: Never Used  Substance Use Topics  . Alcohol use: No    Alcohol/week: 0.0 standard drinks  . Drug use: Never     Medication list has been reviewed and updated.  Current Meds  Medication Sig  . amLODipine (NORVASC) 10 MG tablet Take 1 tablet (10 mg total) by mouth daily.  . valsartan-hydrochlorothiazide (DIOVAN HCT) 320-25 MG tablet Take 1 tablet by mouth daily.    PHQ 2/9 Scores 02/25/2019 02/21/2018 02/21/2016  PHQ - 2 Score 0 0 0    BP Readings from Last 3 Encounters:  02/25/19 124/78  08/29/18 130/83  08/19/18 (!) 140/94    Physical Exam Vitals signs and nursing note reviewed.  Constitutional:      Appearance: Normal appearance. He is well-developed.  HENT:     Head: Normocephalic.     Right Ear: Tympanic membrane, ear canal and external ear normal.     Left Ear: Tympanic membrane, ear canal and external ear normal.     Nose: Nose normal.     Mouth/Throat:     Pharynx: Uvula midline.  Eyes:     Conjunctiva/sclera: Conjunctivae normal.     Pupils: Pupils are equal, round, and reactive to light.  Neck:     Musculoskeletal: Normal range of motion and neck supple.  Thyroid: No thyromegaly.     Vascular: No carotid bruit.  Cardiovascular:     Rate and Rhythm: Normal rate and regular rhythm.     Heart sounds: Normal heart sounds.  Pulmonary:     Effort: Pulmonary effort is normal.     Breath sounds: Normal breath sounds. No wheezing.  Chest:     Breasts:        Right: No mass.        Left: No mass.  Abdominal:     General: Bowel sounds are normal.     Palpations: Abdomen is soft.     Tenderness: There is no abdominal tenderness.  Musculoskeletal: Normal range of motion.      Lumbar back: He exhibits normal range of motion, no bony tenderness, no swelling and no spasm.  Lymphadenopathy:     Cervical: No cervical adenopathy.  Skin:    General: Skin is warm and dry.  Neurological:     Mental Status: He is alert and oriented to person, place, and time.     Deep Tendon Reflexes: Reflexes are normal and symmetric.  Psychiatric:        Speech: Speech normal.        Behavior: Behavior normal.        Thought Content: Thought content normal.        Judgment: Judgment normal.     Wt Readings from Last 3 Encounters:  02/25/19 240 lb (108.9 kg)  08/29/18 236 lb (107 kg)  08/19/18 238 lb (108 kg)    BP 124/78   Pulse 61   Ht 6\' 1"  (1.854 m)   Wt 240 lb (108.9 kg)   SpO2 96%   BMI 31.66 kg/m   Assessment and Plan: 1. Annual physical exam Normal exam except for weight Discussed regular exercise such as walking - Lipid panel - POCT urinalysis dipstick  2. Colon cancer screening Pt to contact [facility]  3. Essential (primary) hypertension controlled - CBC with Differential/Platelet - Comprehensive metabolic panel - amLODipine (NORVASC) 10 MG tablet; Take 1 tablet (10 mg total) by mouth daily.  Dispense: 30 tablet; Refill: 12  4. BPH associated with nocturia DRE deferred - PSA  5. Chronic midline low back pain without sciatica Continue Aleve as needed; ice or heat Change work habits to avoid excessive strain   Partially dictated using [employer], [employer]. Any errors are unintentional.  Halina Maidens, MD [facility]  02/25/2019

## 2019-02-25 NOTE — Patient Instructions (Signed)
Schedule your Colonoscopy and ask them to send me the report.

## 2019-02-26 LAB — CBC WITH DIFFERENTIAL/PLATELET
Basophils Absolute: 0 10*3/uL (ref 0.0–0.2)
Basos: 1 %
EOS (ABSOLUTE): 0.2 10*3/uL (ref 0.0–0.4)
Eos: 2 %
Hematocrit: 44.4 % (ref 37.5–51.0)
Hemoglobin: 14.8 g/dL (ref 13.0–17.7)
Immature Grans (Abs): 0 10*3/uL (ref 0.0–0.1)
Immature Granulocytes: 0 %
Lymphocytes Absolute: 2.4 10*3/uL (ref 0.7–3.1)
Lymphs: 29 %
MCH: 30.1 pg (ref 26.6–33.0)
MCHC: 33.3 g/dL (ref 31.5–35.7)
MCV: 90 fL (ref 79–97)
Monocytes Absolute: 0.7 10*3/uL (ref 0.1–0.9)
Monocytes: 9 %
Neutrophils Absolute: 5 10*3/uL (ref 1.4–7.0)
Neutrophils: 59 %
Platelets: 245 10*3/uL (ref 150–450)
RBC: 4.91 x10E6/uL (ref 4.14–5.80)
RDW: 13.2 % (ref 11.6–15.4)
WBC: 8.3 10*3/uL (ref 3.4–10.8)

## 2019-02-26 LAB — COMPREHENSIVE METABOLIC PANEL
ALT: 26 IU/L (ref 0–44)
AST: 17 IU/L (ref 0–40)
Albumin/Globulin Ratio: 1.8 (ref 1.2–2.2)
Albumin: 4.5 g/dL (ref 3.8–4.9)
Alkaline Phosphatase: 50 IU/L (ref 39–117)
BUN/Creatinine Ratio: 12 (ref 9–20)
BUN: 16 mg/dL (ref 6–24)
Bilirubin Total: 0.5 mg/dL (ref 0.0–1.2)
CO2: 24 mmol/L (ref 20–29)
Calcium: 9.7 mg/dL (ref 8.7–10.2)
Chloride: 98 mmol/L (ref 96–106)
Creatinine, Ser: 1.35 mg/dL — ABNORMAL HIGH (ref 0.76–1.27)
GFR calc Af Amer: 68 mL/min/{1.73_m2} (ref >59)
GFR calc non Af Amer: 59 mL/min/{1.73_m2} — ABNORMAL LOW (ref >59)
Globulin, Total: 2.5 g/dL (ref 1.5–4.5)
Glucose: 113 mg/dL — ABNORMAL HIGH (ref 65–99)
Potassium: 4.4 mmol/L (ref 3.5–5.2)
Sodium: 137 mmol/L (ref 134–144)
Total Protein: 7 g/dL (ref 6.0–8.5)

## 2019-02-26 LAB — LIPID PANEL
Chol/HDL Ratio: 4.7 ratio (ref 0.0–5.0)
Cholesterol, Total: 180 mg/dL (ref 100–199)
HDL: 38 mg/dL — ABNORMAL LOW (ref >39)
LDL Calculated: 114 mg/dL — ABNORMAL HIGH (ref 0–99)
Triglycerides: 140 mg/dL (ref 0–149)
VLDL Cholesterol Cal: 28 mg/dL (ref 5–40)

## 2019-02-26 LAB — PSA: Prostate Specific Ag, Serum: 2.3 ng/mL (ref 0.0–4.0)

## 2019-03-20 ENCOUNTER — Encounter: Payer: Self-pay | Admitting: Internal Medicine

## 2019-03-20 ENCOUNTER — Ambulatory Visit: Payer: BC Managed Care – PPO | Admitting: Internal Medicine

## 2019-03-20 ENCOUNTER — Other Ambulatory Visit: Payer: Self-pay

## 2019-03-20 VITALS — BP 118/78 | HR 95 | Ht 73.0 in | Wt 221.0 lb

## 2019-03-20 DIAGNOSIS — R42 Dizziness and giddiness: Secondary | ICD-10-CM

## 2019-03-20 DIAGNOSIS — R9431 Abnormal electrocardiogram [ECG] [EKG]: Secondary | ICD-10-CM

## 2019-03-20 DIAGNOSIS — I10 Essential (primary) hypertension: Secondary | ICD-10-CM

## 2019-03-20 NOTE — Patient Instructions (Addendum)
Flonase nasal spray - 2 sprays on each side of your nose once a day.  Take a baby aspirin 81 mg every day

## 2019-03-20 NOTE — Progress Notes (Signed)
Date:  03/20/2019   Name:  John Hanna   DOB:  July 02, 1961   MRN:  [id number]   Chief Complaint: Dizziness  Dizziness  This is a new problem. The current episode started yesterday. The problem has been resolved. Associated symptoms include arthralgias and congestion. Pertinent negatives include no abdominal pain, chest pain, chills, diaphoresis, headaches, nausea, sore throat or vomiting. Exacerbated by: had been working outside, got over heated and then felt lightheaded.  After cooling down and resting, he felt better.  Hypertension  This is a chronic problem. The problem is controlled. Associated symptoms include shortness of breath. Pertinent negatives include no chest pain, headaches or palpitations. Past treatments include calcium channel blockers, diuretics and angiotensin blockers. The current treatment provides significant improvement. Hypertensive end-organ damage includes kidney disease (mild decrease in renal function).    Review of Systems  Constitutional: Negative for chills and diaphoresis.  HENT: Positive for congestion, postnasal drip and sinus pressure. Negative for sore throat and trouble swallowing.   Eyes: Negative for visual disturbance.  Respiratory: Positive for shortness of breath. Negative for choking, chest tightness and wheezing.   Cardiovascular: Negative for chest pain, palpitations and leg swelling.  Gastrointestinal: Negative for abdominal pain, nausea and vomiting.  Musculoskeletal: Positive for arthralgias and back pain.  Allergic/Immunologic: Positive for environmental allergies.  Neurological: Positive for dizziness and light-headedness. Negative for syncope and headaches.    Patient Active Problem List   Diagnosis Date Noted  . BPH associated with nocturia 02/21/2018  . Essential (primary) hypertension 07/26/2015  . History of colon polyps 07/26/2015  . H/O malignant neoplasm of skin 07/26/2015  . Plantar fasciitis 07/26/2015    Allergies   Allergen Reactions  . Cyclobenzaprine Other (See Comments)    Acute urinary retention    Past Surgical History:  Procedure Laterality Date  . COLONOSCOPY  01/2011   repeat 3 yrs - @ [facility] internal medicine  . HERNIA REPAIR  1992    Social History   Tobacco Use  . Smoking status: Former Smoker    Packs/day: 0.50    Years: 5.00    Pack years: 2.50    Types: Cigarettes    Last attempt to quit: 10/17/1987    Years since quitting: 31.4  . Smokeless tobacco: Never Used  Substance Use Topics  . Alcohol use: No    Alcohol/week: 0.0 standard drinks  . Drug use: Never     Medication list has been reviewed and updated.  Current Meds  Medication Sig  . amLODipine (NORVASC) 10 MG tablet Take 1 tablet (10 mg total) by mouth daily.  . valsartan-hydrochlorothiazide (DIOVAN HCT) 320-25 MG tablet Take 1 tablet by mouth daily.    PHQ 2/9 Scores 03/20/2019 02/25/2019 02/21/2018 02/21/2016  PHQ - 2 Score 0 0 0 0    BP Readings from Last 3 Encounters:  03/20/19 118/78  02/25/19 124/78  08/29/18 130/83    Physical Exam Vitals signs and nursing note reviewed.  Constitutional:      General: He is not in acute distress.    Appearance: He is well-developed.  HENT:     Head: Normocephalic and atraumatic.     Right Ear: Ear canal normal. Tympanic membrane is retracted. Tympanic membrane is not erythematous.     Left Ear: Ear canal normal. Tympanic membrane is retracted. Tympanic membrane is not erythematous.     Nose:     Right Sinus: No maxillary sinus tenderness or frontal sinus tenderness.     Left  Sinus: No maxillary sinus tenderness or frontal sinus tenderness.     Mouth/Throat:     Pharynx: Oropharynx is clear.  Neck:     Musculoskeletal: Normal range of motion.  Cardiovascular:     Rate and Rhythm: Normal rate and regular rhythm.     Pulses: Normal pulses.     Heart sounds: Normal heart sounds. No murmur.  Pulmonary:     Effort: Pulmonary effort is normal. No respiratory  distress.     Breath sounds: Normal breath sounds.  Musculoskeletal: Normal range of motion.  Lymphadenopathy:     Cervical: No cervical adenopathy.  Skin:    General: Skin is warm and dry.     Findings: No rash.  Neurological:     Mental Status: He is alert and oriented to person, place, and time.  Psychiatric:        Behavior: Behavior normal.        Thought Content: Thought content normal.     Wt Readings from Last 3 Encounters:  03/20/19 221 lb (100.2 kg)  02/25/19 240 lb (108.9 kg)  08/29/18 236 lb (107 kg)    BP 118/78   Pulse 95   Ht 6\' 1"  (1.854 m)   Wt 221 lb (100.2 kg)   SpO2 96%   BMI 29.16 kg/m   Assessment and Plan: 1. Episode of dizziness No EKG explanation Suspect sinus congestion - begin Flonase spray Follow up if worsening - EKG 12-Lead - SR @ 83, non specific T wave abnormality  2. Essential (primary) hypertension controlled  3. EKG, abnormal Begin ASA 81 mg daily At risk for CAD - recommend evaluation - Ambulatory referral to Cardiology   Partially dictated using [place]. Any errors are unintentional.  Halina Maidens, MD [facility]  03/20/2019

## 2019-04-24 DIAGNOSIS — I5189 Other ill-defined heart diseases: Secondary | ICD-10-CM

## 2019-04-24 HISTORY — DX: Other ill-defined heart diseases: I51.89

## 2019-04-28 ENCOUNTER — Telehealth: Payer: Self-pay | Admitting: *Deleted

## 2019-04-28 ENCOUNTER — Ambulatory Visit: Payer: BC Managed Care – PPO | Admitting: Internal Medicine

## 2019-04-28 ENCOUNTER — Other Ambulatory Visit: Payer: Self-pay

## 2019-04-28 DIAGNOSIS — Z20822 Contact with and (suspected) exposure to covid-19: Secondary | ICD-10-CM

## 2019-04-28 NOTE — Telephone Encounter (Signed)
Pt scheduled for covid testing today @ [facility] @ 2:15. Instructions given and order placed

## 2019-04-28 NOTE — Telephone Encounter (Signed)
-----   Message from Clista Bernhardt, [place] sent at 04/28/2019 11:33 AM EDT ----- Regarding: Needs testing with wife Patient having symptoms of cough, fatigue, sore throat, runny nose since Saturday. No thermometer to check tempeture. Dr. Halina Maidens wants patient sent for testing per symptoms.  Wife Cobain Morici will be sent as well per another doctor.

## 2019-05-01 LAB — NOVEL CORONAVIRUS, NAA: SARS-CoV-2, NAA: NOT DETECTED

## 2019-05-03 IMAGING — CR DG LUMBAR SPINE COMPLETE 4+V
5 series · 5 of 5 positions shown · non-contrast
Comparison: None

CLINICAL DATA: Lower back pain, RIGHT leg pain, numbness and
tingling for cup months

EXAM:
LUMBAR SPINE - COMPLETE 4+ VIEW

[l-spine ap]
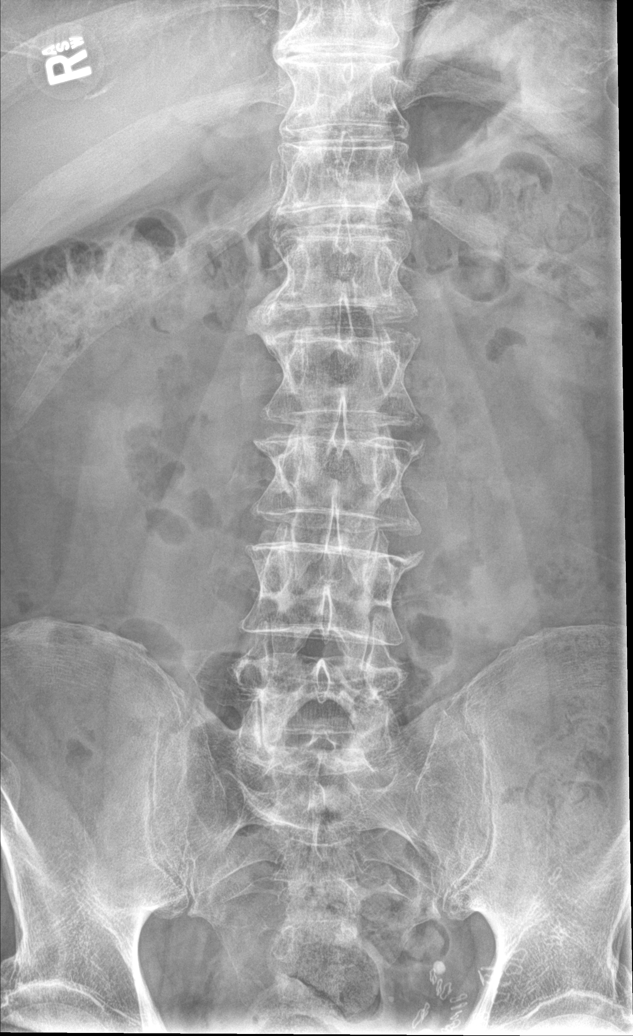

[l-spine obl (1 of 2)]
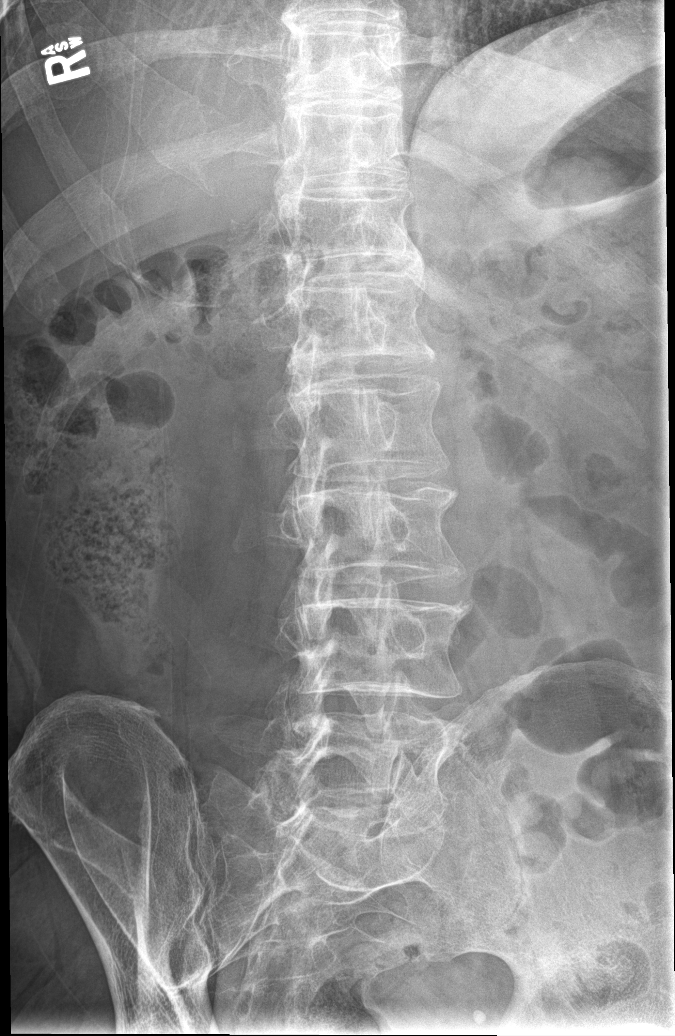

[l-spine obl (2 of 2)]
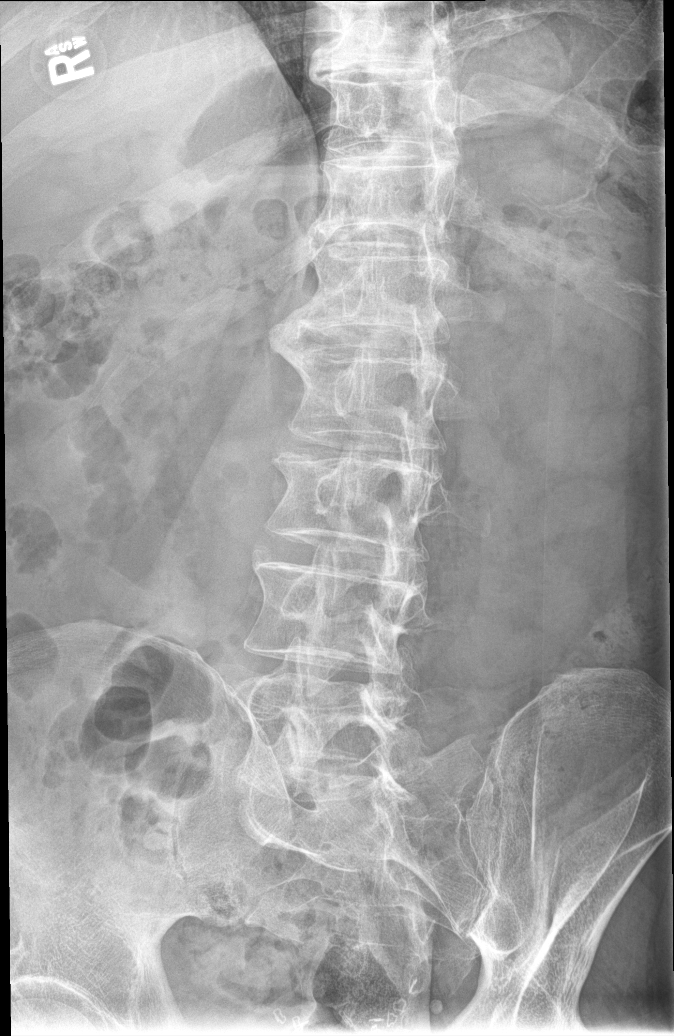

[l-spine lat]
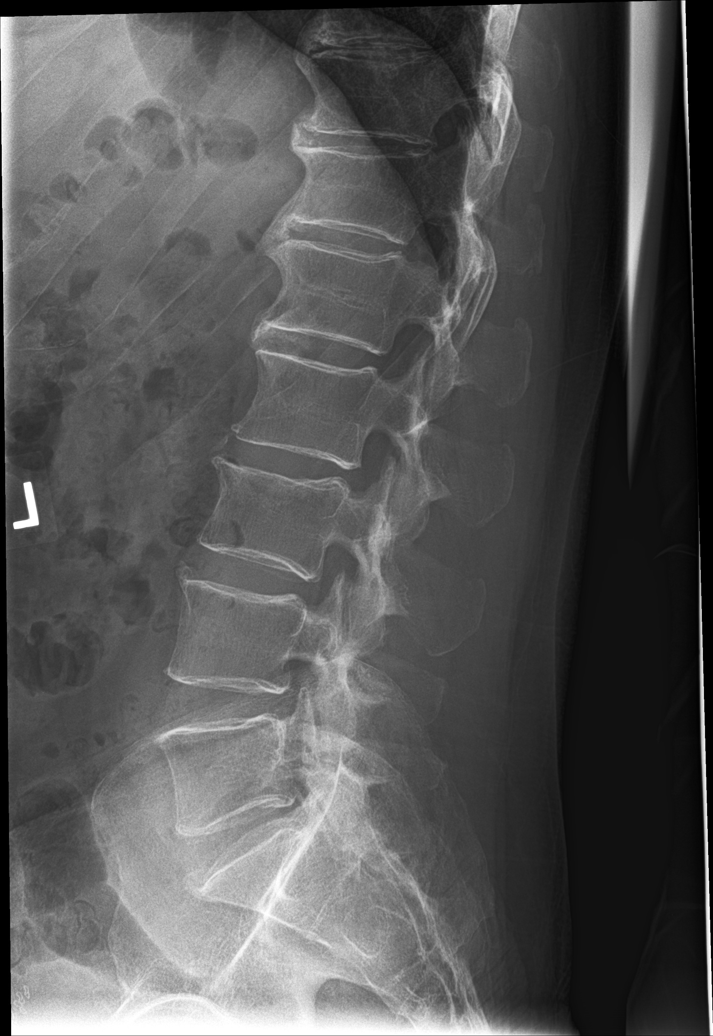

[l-spine spot]
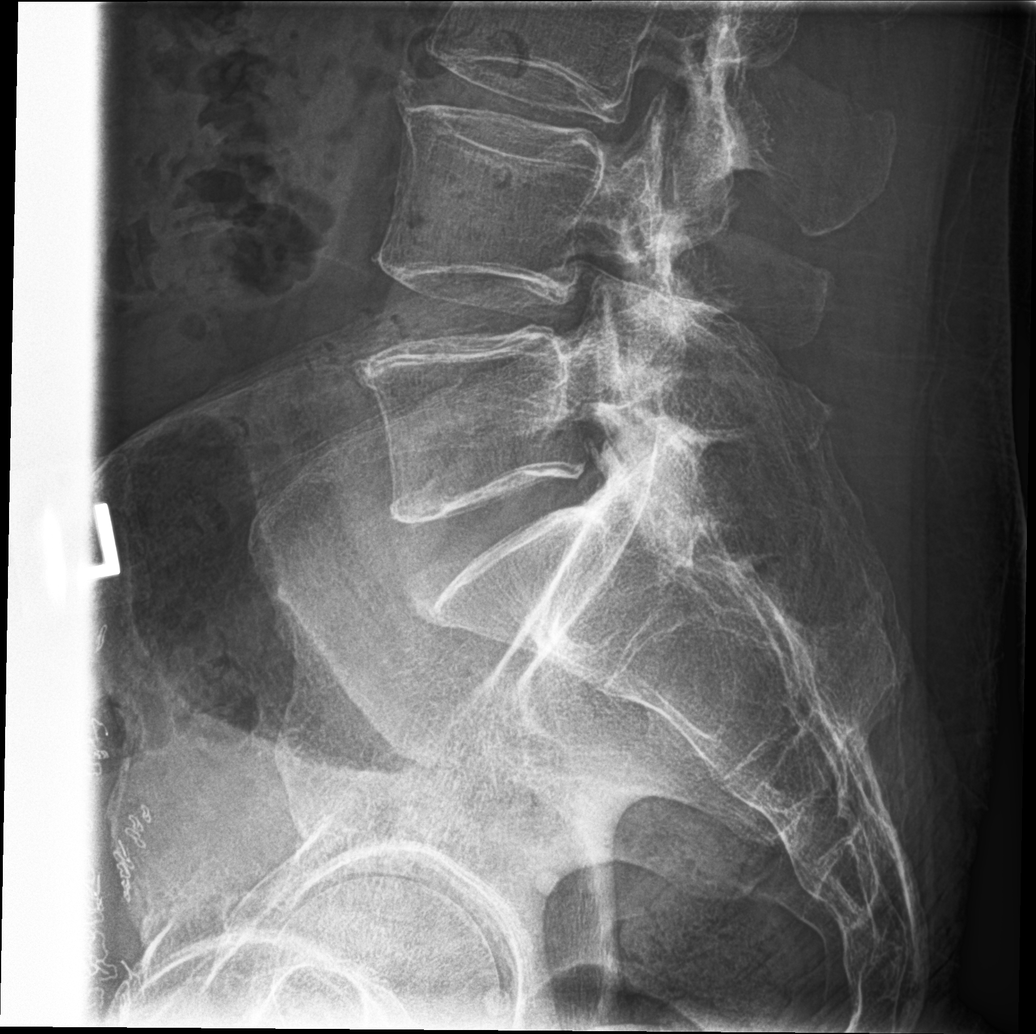

[5 of 5 positions shown; findings below may reference images not displayed]

FINDINGS: Five non-rib-bearing lumbar vertebra.

Bones demineralized.

Mild disc space narrowing and endplate spur formation at multiple
levels.

Minimal superior endplate height loss of L3 vertebral body, age
indeterminate.

Remaining vertebral body heights maintained.

No subluxation or bone destruction.

No spondylolysis.

SI joints preserved.
IMPRESSION: Osseous demineralization with multilevel degenerative disc disease
changes of the lumbar spine.

Subtle superior endplate height loss of L3 vertebral body, age
indeterminate.

## 2019-06-20 ENCOUNTER — Ambulatory Visit: Payer: BC Managed Care – PPO | Admitting: Internal Medicine

## 2019-06-20 ENCOUNTER — Encounter: Payer: Self-pay | Admitting: Internal Medicine

## 2019-06-20 ENCOUNTER — Other Ambulatory Visit: Payer: Self-pay

## 2019-06-20 VITALS — BP 117/78 | HR 59 | Resp 16 | Ht 73.0 in | Wt 234.0 lb

## 2019-06-20 DIAGNOSIS — I480 Paroxysmal atrial fibrillation: Secondary | ICD-10-CM | POA: Diagnosis not present

## 2019-06-20 DIAGNOSIS — Z9989 Dependence on other enabling machines and devices: Secondary | ICD-10-CM | POA: Diagnosis not present

## 2019-06-20 DIAGNOSIS — I10 Essential (primary) hypertension: Secondary | ICD-10-CM | POA: Diagnosis not present

## 2019-06-20 DIAGNOSIS — G4733 Obstructive sleep apnea (adult) (pediatric): Secondary | ICD-10-CM | POA: Diagnosis not present

## 2019-06-20 NOTE — Progress Notes (Signed)
Date:  06/20/2019   Name:  John Hanna   DOB:  [birth year]   MRN:  [id number]   Chief Complaint: Hypertension (Recheck kidney function.)  Hypertension This is a chronic problem. The problem is controlled (bp at home 118/70). Pertinent negatives include no chest pain, headaches or shortness of breath. Past treatments include beta blockers, angiotensin blockers and diuretics.  OSA - now on CPAP nightly.  Sleeping well.  He can tell that his energy is improved and he has much less daytime somnolence. Single episode Afib/multiple ectopic beats - seen on Holter monitor done by Cardiology.  Felt to be due to the untreated sleep apnea.  His amlodipine was stopped and low dose metoprolol was started. Lab Results  Component Value Date   CREATININE 1.35 (H) 02/25/2019   BUN 16 02/25/2019   NA 137 02/25/2019   K 4.4 02/25/2019   CL 98 02/25/2019   CO2 24 02/25/2019     Review of Systems  Constitutional: Negative for chills, fatigue and fever.  Respiratory: Negative for cough, chest tightness, shortness of breath and wheezing.   Cardiovascular: Negative for chest pain.  Gastrointestinal: Negative for abdominal pain.  Musculoskeletal: Negative for arthralgias and gait problem.  Neurological: Negative for dizziness, light-headedness and headaches.    Patient Active Problem List   Diagnosis Date Noted  . OSA on CPAP 06/20/2019  . BPH associated with nocturia 02/21/2018  . Essential (primary) hypertension 07/26/2015  . History of colon polyps 07/26/2015  . H/O malignant neoplasm of skin 07/26/2015  . Plantar fasciitis 07/26/2015    Allergies  Allergen Reactions  . Cyclobenzaprine Other (See Comments)    Acute urinary retention    Past Surgical History:  Procedure Laterality Date  . COLONOSCOPY  01/2011   repeat 3 yrs - @ [facility] internal medicine  . HERNIA REPAIR  1992    Social History   Tobacco Use  . Smoking status: Former Smoker    Packs/day: 0.50    Years:  5.00    Pack years: 2.50    Types: Cigarettes    Quit date: 10/17/1987    Years since quitting: 31.6  . Smokeless tobacco: Never Used  Substance Use Topics  . Alcohol use: No    Alcohol/week: 0.0 standard drinks  . Drug use: Never     Medication list has been reviewed and updated.  Current Meds  Medication Sig  . aspirin EC 81 MG tablet Take by mouth.  . fluticasone (FLONASE) 50 MCG/ACT nasal spray Place into the nose.  . metoprolol succinate (TOPROL-XL) 50 MG 24 hr tablet Take 50 mg by mouth daily.  . NON FORMULARY CPAP Nightly.  . valsartan-hydrochlorothiazide (DIOVAN HCT) 320-25 MG tablet Take 1 tablet by mouth daily.    PHQ 2/9 Scores 06/20/2019 03/20/2019 02/25/2019 02/21/2018  PHQ - 2 Score 0 0 0 0  PHQ- 9 Score 0 - - -    BP Readings from Last 3 Encounters:  06/20/19 117/78  03/20/19 118/78  02/25/19 124/78    Physical Exam Vitals signs and nursing note reviewed.  Constitutional:      General: He is not in acute distress.    Appearance: He is well-developed.  HENT:     Head: Normocephalic and atraumatic.  Cardiovascular:     Rate and Rhythm: Normal rate and regular rhythm.     Pulses: Normal pulses.     Heart sounds: No murmur.  Pulmonary:     Effort: Pulmonary effort is normal. No respiratory  distress.  Musculoskeletal: Normal range of motion.     Right lower leg: No edema.     Left lower leg: No edema.  Skin:    General: Skin is warm and dry.     Capillary Refill: Capillary refill takes less than 2 seconds.     Findings: No rash.  Neurological:     Mental Status: He is alert and oriented to person, place, and time.  Psychiatric:        Behavior: Behavior normal.        Thought Content: Thought content normal.     Wt Readings from Last 3 Encounters:  06/20/19 234 lb (106.1 kg)  03/20/19 221 lb (100.2 kg)  02/25/19 240 lb (108.9 kg)    BP 117/78   Pulse (!) 59   Resp 16   Ht 6\' 1"  (1.854 m)   Wt 234 lb (106.1 kg)   SpO2 98%   BMI 30.87 kg/m    Assessment and Plan: 1. Essential (primary) hypertension Clinically stable exam with well controlled BP.   Last renal function slightly decreased - needs to be checked today Continue to stay hydrated Tolerating medications, valsartan hct and metoprolol, without side effects at this time. Pt to continue current regimen and low sodium diet; benefits of regular exercise as able discussed.  2. OSA on CPAP Now on CPap nightly with excellent compliance.  3. Paroxysmal atrial fibrillation (HCC) Now on metoprolol and tolerating it well No recurrent episodes of lightheadedness Follow up with cardiology as planned   Partially dictated using [place]. Any errors are unintentional.  Halina Maidens, MD [facility]  06/20/2019

## 2019-06-21 LAB — BASIC METABOLIC PANEL
BUN/Creatinine Ratio: 14 (ref 9–20)
BUN: 16 mg/dL (ref 6–24)
CO2: 26 mmol/L (ref 20–29)
Calcium: 9.5 mg/dL (ref 8.7–10.2)
Chloride: 101 mmol/L (ref 96–106)
Creatinine, Ser: 1.14 mg/dL (ref 0.76–1.27)
GFR calc Af Amer: 83 mL/min/{1.73_m2} (ref >59)
GFR calc non Af Amer: 72 mL/min/{1.73_m2} (ref >59)
Glucose: 100 mg/dL — ABNORMAL HIGH (ref 65–99)
Potassium: 4.4 mmol/L (ref 3.5–5.2)
Sodium: 141 mmol/L (ref 134–144)

## 2019-08-23 ENCOUNTER — Other Ambulatory Visit: Payer: Self-pay | Admitting: Internal Medicine

## 2019-08-23 DIAGNOSIS — I10 Essential (primary) hypertension: Secondary | ICD-10-CM

## 2019-09-28 ENCOUNTER — Encounter: Payer: Self-pay | Admitting: Emergency Medicine

## 2019-09-28 ENCOUNTER — Emergency Department
Admission: EM | Admit: 2019-09-28 | Discharge: 2019-09-28 | Disposition: A | Discharge status: Home / Self Care | Payer: BC Managed Care – PPO | Attending: Emergency Medicine | Admitting: Emergency Medicine

## 2019-09-28 ENCOUNTER — Emergency Department: Payer: BC Managed Care – PPO

## 2019-09-28 ENCOUNTER — Other Ambulatory Visit: Payer: Self-pay

## 2019-09-28 DIAGNOSIS — Z79899 Other long term (current) drug therapy: Secondary | ICD-10-CM | POA: Diagnosis not present

## 2019-09-28 DIAGNOSIS — Z85828 Personal history of other malignant neoplasm of skin: Secondary | ICD-10-CM | POA: Diagnosis not present

## 2019-09-28 DIAGNOSIS — I4891 Unspecified atrial fibrillation: Secondary | ICD-10-CM | POA: Diagnosis not present

## 2019-09-28 DIAGNOSIS — R0789 Other chest pain: Secondary | ICD-10-CM | POA: Diagnosis present

## 2019-09-28 DIAGNOSIS — Z7982 Long term (current) use of aspirin: Secondary | ICD-10-CM | POA: Diagnosis not present

## 2019-09-28 DIAGNOSIS — Z20828 Contact with and (suspected) exposure to other viral communicable diseases: Secondary | ICD-10-CM | POA: Diagnosis not present

## 2019-09-28 DIAGNOSIS — Z87891 Personal history of nicotine dependence: Secondary | ICD-10-CM | POA: Insufficient documentation

## 2019-09-28 DIAGNOSIS — R079 Chest pain, unspecified: Secondary | ICD-10-CM

## 2019-09-28 DIAGNOSIS — I10 Essential (primary) hypertension: Secondary | ICD-10-CM | POA: Insufficient documentation

## 2019-09-28 HISTORY — DX: Unspecified atrial fibrillation: I48.91

## 2019-09-28 LAB — RESPIRATORY PANEL BY RT PCR (FLU A&B, COVID)
Influenza A by PCR: NEGATIVE
Influenza B by PCR: NEGATIVE
SARS Coronavirus 2 by RT PCR: NEGATIVE

## 2019-09-28 LAB — CBC
HCT: 45.9 % (ref 39.0–52.0)
Hemoglobin: 14.9 g/dL (ref 13.0–17.0)
MCH: 29.6 pg (ref 26.0–34.0)
MCHC: 32.5 g/dL (ref 30.0–36.0)
MCV: 91.1 fL (ref 80.0–100.0)
Platelets: 237 10*3/uL (ref 150–400)
RBC: 5.04 MIL/uL (ref 4.22–5.81)
RDW: 12.7 % (ref 11.5–15.5)
WBC: 11.5 10*3/uL — ABNORMAL HIGH (ref 4.0–10.5)
nRBC: 0 % (ref 0.0–0.2)

## 2019-09-28 LAB — TROPONIN I (HIGH SENSITIVITY)
Troponin I (High Sensitivity): 10 ng/L (ref <18)
Troponin I (High Sensitivity): 10 ng/L (ref <18)

## 2019-09-28 LAB — BASIC METABOLIC PANEL
Anion gap: 13 (ref 5–15)
BUN: 17 mg/dL (ref 6–20)
CO2: 26 mmol/L (ref 22–32)
Calcium: 9.3 mg/dL (ref 8.9–10.3)
Chloride: 102 mmol/L (ref 98–111)
Creatinine, Ser: 1.31 mg/dL — ABNORMAL HIGH (ref 0.61–1.24)
GFR calc Af Amer: >60 mL/min (ref >60)
GFR calc non Af Amer: >60 mL/min (ref >60)
Glucose, Bld: 120 mg/dL — ABNORMAL HIGH (ref 70–99)
Potassium: 2.9 mmol/L — ABNORMAL LOW (ref 3.5–5.1)
Sodium: 141 mmol/L (ref 135–145)

## 2019-09-28 MED ORDER — DILTIAZEM HCL 100 MG IV SOLR
INTRAVENOUS | Status: AC
  Filled 2019-09-28: qty 100

## 2019-09-28 MED ORDER — DILTIAZEM HCL 25 MG/5ML IV SOLN
15.0000 mg | Freq: Once | INTRAVENOUS | Status: AC
  Administered 2019-09-28: 12:00:00 15 mg via INTRAVENOUS

## 2019-09-28 MED ORDER — SODIUM CHLORIDE 0.9% FLUSH
3.0000 mL | Freq: Once | INTRAVENOUS | Status: AC
  Administered 2019-09-28: 12:00:00 3 mL via INTRAVENOUS

## 2019-09-28 MED ORDER — SODIUM CHLORIDE 0.9 % IV BOLUS
1000.0000 mL | Freq: Once | INTRAVENOUS | Status: AC
  Administered 2019-09-28: 1000 mL via INTRAVENOUS

## 2019-09-28 MED ORDER — DILTIAZEM HCL 25 MG/5ML IV SOLN
5.0000 mg | Freq: Once | INTRAVENOUS | Status: AC
  Administered 2019-09-28: 12:00:00 5 mg via INTRAVENOUS

## 2019-09-28 MED ORDER — DILTIAZEM HCL 100 MG IV SOLR: 5.0000 mg/h | INTRAVENOUS | Status: DC

## 2019-09-28 MED ORDER — DILTIAZEM HCL 25 MG/5ML IV SOLN
10.0000 mg | Freq: Once | INTRAVENOUS | Status: AC
  Administered 2019-09-28: 10 mg via INTRAVENOUS

## 2019-09-28 MED ORDER — DILTIAZEM HCL 25 MG/5ML IV SOLN
INTRAVENOUS | Status: AC
  Filled 2019-09-28: qty 10

## 2019-09-28 MED ORDER — POTASSIUM CHLORIDE 20 MEQ/15ML (10%) PO SOLN
40.0000 meq | Freq: Once | ORAL | Status: AC
  Administered 2019-09-28: 16:00:00 40 meq via ORAL
  Filled 2019-09-28: qty 30

## 2019-09-28 NOTE — ED Notes (Signed)
Pt denies chest pain or SOB, states he continues to feel good. Pt's wife at bedside. Blanket given to pt, coffee and cracker given to wife who states she needs something to eat since she is diabetic. Pt told he cannot have food or drink at this time per EDP.

## 2019-09-28 NOTE — ED Notes (Signed)
This RN will monitor pt for the next 30-45 minutes and if pt stable, turn off diltiazem drip per Dr. Cinda Quest.

## 2019-09-28 NOTE — ED Notes (Signed)
Pt alert and no c/o pain, pt states headache is gone. Pt up in room without chest pain or SOB. Pt wheeled to lobby for discharge.

## 2019-09-28 NOTE — ED Notes (Signed)
Diltiazem drip paused per Dr. Cinda Quest. This RN will monitor VS. Pt denies chest pain, tingling in fingers, SOB. Pt appears calm and no apparent distress.

## 2019-09-28 NOTE — ED Triage Notes (Signed)
Pt to ED via POV c/o diarrhea and chest pain. Pt state that diarrhea started last week, was better Thursday-Saturday but then he woke up this morning around 0300 with diarrhea again. Pt denies N/V. Pt states that about 1 hour PTA he started having chest pain. Pt states that the pain felt like pressure in the center of his chest. Pt states that it has eased off but that he is also having dizzy spells and chills. Pt denies fever that he is aware of .

## 2019-09-28 NOTE — ED Notes (Signed)
Pt helped to sit up on side of bed, HR remaining 51-64, bp 112/73

## 2019-09-28 NOTE — ED Notes (Signed)
Diltiazem remains paused per EDP. Pt alert, no c/o chest pain, SOB, or finger tingling.

## 2019-09-28 NOTE — ED Notes (Signed)
Pt with VSS, appears comfortable, no c/o chest pain. Toileting offered, pt states he is ok for now, urinal at bedside.

## 2019-09-28 NOTE — ED Provider Notes (Addendum)
[facility] Emergency Department Provider Note   ____________________________________________   First MD Initiated Contact with Patient 09/28/19 1148     (approximate)  I have reviewed the triage vital signs and the nursing notes.   HISTORY  Chief Complaint Chest Pain    HPI John Hanna is a 58 y.o. male patient complains of diarrhea for about a week.  He is not had any nausea or vomiting.  He woke up this morning with diarrhea again and then chest pain about an hour prior to arrival.  His pressure in the middle of his chest.  He is also had some dizzy spells.  Does not have any fever that he is aware of.  Chest pain seems to get worse when his heart is racing more and better when it slows down.  EKG looks like A. fib.  With RVR.  No response to vagal maneuvers.         Past Medical History:  Diagnosis Date  . Arthritis   . Atrial fibrillation ([place])   . Hypertension     Patient Active Problem List   Diagnosis Date Noted  . OSA on CPAP 06/20/2019  . BPH associated with nocturia 02/21/2018  . Essential (primary) hypertension 07/26/2015  . History of colon polyps 07/26/2015  . H/O malignant neoplasm of skin 07/26/2015  . Plantar fasciitis 07/26/2015    Past Surgical History:  Procedure Laterality Date  . COLONOSCOPY  01/2011   repeat 3 yrs - @ [facility] internal medicine  . HERNIA REPAIR  1992    Prior to Admission medications   Medication Sig Start Date End Date Taking? Authorizing Provider  aspirin EC 81 MG tablet Take by mouth.    [provider]  fluticasone (FLONASE) 50 MCG/ACT nasal spray Place into the nose.    [provider]  metoprolol succinate (TOPROL-XL) 50 MG 24 hr tablet Take 50 mg by mouth daily. 05/29/19   [provider]  NON FORMULARY CPAP Nightly.    [provider]  valsartan-hydrochlorothiazide (DIOVAN-HCT) 320-25 MG tablet Take 1 tablet by mouth once daily 08/24/19   [name], MD    Allergies Cyclobenzaprine  Family History  Problem Relation Age of Onset  . Colon cancer Mother        died age 69  . Hypertension Father   . Prostate cancer Father 71    Social History Social History   Tobacco Use  . Smoking status: Former Smoker    Packs/day: 0.50    Years: 5.00    Pack years: 2.50    Types: Cigarettes    Quit date: 10/17/1987    Years since quitting: 31.9  . Smokeless tobacco: Never Used  Substance Use Topics  . Alcohol use: No    Alcohol/week: 0.0 standard drinks  . Drug use: Never    Review of Systems  Constitutional: No fever/chills Eyes: No visual changes. ENT: No sore throat. Cardiovascular: chest pain. Respiratory: Shortness of breath since being diagnosed with A. fib quite some time ago.  This is not any different than usually. Gastrointestinal: No abdominal pain.  No nausea, no vomiting.   diarrhea.  No constipation. Genitourinary: Negative for dysuria. Musculoskeletal: Negative for back pain. Skin: Negative for rash. Neurological: Negative for headaches, focal weakness    ____________________________________________   PHYSICAL EXAM:  VITAL SIGNS: ED Triage Vitals  Enc Vitals Group     BP 09/28/19 1131 137/79     Pulse Rate 09/28/19 1150 (!)  107     Resp 09/28/19 1140 (!) 8     Temp --      Temp src --      SpO2 09/28/19 1150 97 %     Weight 09/28/19 1203 235 lb (106.6 kg)     Height 09/28/19 1203 6\' 2"  (1.88 m)     Head Circumference --      Peak Flow --      Pain Score --      Pain Loc --      Pain Edu? --      Excl. in [place]? --     Constitutional: Alert and oriented. Well appearing and in no acute distress. Eyes: Conjunctivae are normal Head: Atraumatic. Nose: No congestion/rhinnorhea. Mouth/Throat: Mucous membranes are moist.  Oropharynx non-erythematous. Neck: No stridor. Cardiovascular: Rapid rate, irregular rhythm. Grossly normal heart sounds.  Good peripheral circulation. Respiratory: Normal  respiratory effort.  No retractions. Lungs CTAB. Gastrointestinal: Soft and nontender. No distention. No abdominal bruits. No CVA tenderness. Musculoskeletal: No lower extremity tenderness nor edema.. Neurologic:  Normal speech and language. No gross focal neurologic deficits are appreciated.  Skin:  Skin is warm, dry and intact. No rash noted.   ____________________________________________   LABS (all labs ordered are listed, but only abnormal results are displayed)  Labs Reviewed  BASIC METABOLIC PANEL - Abnormal; Notable for the following components:      Result Value   Potassium 2.9 (*)    Glucose, Bld 120 (*)    Creatinine, Ser 1.31 (*)    All other components within normal limits  CBC - Abnormal; Notable for the following components:   WBC 11.5 (*)    All other components within normal limits  RESPIRATORY PANEL BY RT PCR (FLU A&B, COVID)  TROPONIN I (HIGH SENSITIVITY)  TROPONIN I (HIGH SENSITIVITY)   ____________________________________________  EKG  EKG shows A. fib at 150 normal axis nonspecific ST-T wave changes EKG #2 shows A. fib at about 150 normal axis lead [id number] that is posterior leads do not show any thing but nonspecific changes EKG #3 shows sinus rhythm rate of 78 normal axis nonspecific ST-T changes this was 1 patient briefly converted between EKGs #1 and 2 in computer ___EKG done at 130 shows A. fib at a rate of 90 normal axis no acute ST-T changes resolution of rate related changes _________________________________________  RADIOLOGY  ED MD interpretation  Official radiology report(s): DG Chest Port 1 View  Result Date: 09/28/2019 CLINICAL DATA:  Chest pain and shortness of breath. EXAM: PORTABLE CHEST 1 VIEW COMPARISON:  None. FINDINGS: The heart size and mediastinal contours are within normal limits. Both lungs are clear. The visualized skeletal structures are unremarkable. IMPRESSION: Normal exam. Electronically Signed   By: Lorriane Shire M.D.    On: 09/28/2019 12:39    ____________________________________________   PROCEDURES  Procedure(s) performed (including Critical Care):  Procedures   ____________________________________________   INITIAL IMPRESSION / ASSESSMENT AND PLAN / ED COURSE  Patient with no further chest pain.  Discussed him with Dr. Ubaldo Glassing his cardiologist.  We will have him take 2 of his metoprolol instead of the 1 and half that he has been taking and see if we can get him off of the drip.  Dr. Ubaldo Glassing will follow him up outpatient assuming his troponins remain negative    ----------------------------------------- 3:59 PM on 09/28/2019 -----------------------------------------  No further chest pain.  Troponins are negative.  Patient doing well.  Heart rate in the low 50s.  Sometimes it up to 57.  We will let him go have him take his metoprolol twice a day call Dr. Ubaldo Glassing in the morning for follow-up.  Have him return if he has any further problems.   I will have him hold his other blood pressure medication till he sees Dr. Ubaldo Glassing currently his blood pressure is 0000000 systolic and he has had no blood pressure medications except for the metoprolol today.       ____________________________________________   FINAL CLINICAL IMPRESSION(S) / ED DIAGNOSES  Final diagnoses:  Atrial fibrillation with RVR (HCC)  Chest pain, unspecified type     ED Discharge Orders    None       Note:  This document was prepared using Dragon voice recognition software and may include unintentional dictation errors.    Nena Polio, MD 09/28/19 1611    Nena Polio, MD 09/28/19 [phone]

## 2019-09-28 NOTE — ED Notes (Signed)
Pt states he "feels a whole lot better". Denies chest pain or tightness or SOB

## 2019-09-28 NOTE — Discharge Instructions (Signed)
Continue taking your medications except for your other blood pressure medication.  Increase the metoprolol to 2 pills 100 mg once a day.  This may make your blood pressure little bit lower.  Be careful.  Dr. Ubaldo Glassing may need to decrease your other medication.  If your blood pressure goes very low please call us or return here and we can do it if you cannot get to you in time.  Right now everything looks okay though.  Please return for any further problems or wooziness or chest pain or palpitations.  I would skip the other blood pressure medication today and tomorrow unless your blood pressure goes up above [id number] systolic.  If it goes up that high restart the other blood pressure medication.  Otherwise wait till Dr. Ubaldo Glassing sees you to tell you what to do about the other blood pressure medication.

## 2019-09-28 NOTE — ED Notes (Signed)
Pt to take 100 mg motoprolol from pt's own home meds. Pt took 100 mg metoprolol, med label checked by this RN. All info appears correct.

## 2020-02-26 ENCOUNTER — Other Ambulatory Visit: Payer: Self-pay

## 2020-02-26 ENCOUNTER — Encounter: Payer: Self-pay | Admitting: Internal Medicine

## 2020-02-26 ENCOUNTER — Ambulatory Visit (INDEPENDENT_AMBULATORY_CARE_PROVIDER_SITE_OTHER): Payer: BC Managed Care – PPO | Admitting: Internal Medicine

## 2020-02-26 VITALS — BP 114/70 | HR 59 | Temp 97.5°F | Ht 73.0 in | Wt 232.0 lb

## 2020-02-26 DIAGNOSIS — Z1211 Encounter for screening for malignant neoplasm of colon: Secondary | ICD-10-CM | POA: Diagnosis not present

## 2020-02-26 DIAGNOSIS — I10 Essential (primary) hypertension: Secondary | ICD-10-CM

## 2020-02-26 DIAGNOSIS — Z0000 Encounter for general adult medical examination without abnormal findings: Secondary | ICD-10-CM | POA: Diagnosis not present

## 2020-02-26 DIAGNOSIS — Z125 Encounter for screening for malignant neoplasm of prostate: Secondary | ICD-10-CM

## 2020-02-26 DIAGNOSIS — I480 Paroxysmal atrial fibrillation: Secondary | ICD-10-CM | POA: Insufficient documentation

## 2020-02-26 LAB — POCT URINALYSIS DIPSTICK
Bilirubin, UA: NEGATIVE
Blood, UA: NEGATIVE
Glucose, UA: NEGATIVE
Ketones, UA: NEGATIVE
Leukocytes, UA: NEGATIVE
Nitrite, UA: NEGATIVE
Protein, UA: NEGATIVE
Spec Grav, UA: 1.01 (ref 1.010–1.025)
Urobilinogen, UA: 0.2 E.U./dL
pH, UA: 5.5 (ref 5.0–8.0)

## 2020-02-26 NOTE — Progress Notes (Signed)
Date:  02/26/2020   Name:  John Hanna   DOB:  1960/10/25   MRN:  [id number]   Chief Complaint: Annual Exam (Referal for colonscopy. Last - 2012. [place]. ) John Hanna is a 59 y.o. male who presents today for his Complete Annual Exam. He feels fairly well. He reports exercising none. He reports he is sleeping fairly well.   Colonoscopy  2012 Immunization History  Administered Date(s) Administered  . Influenza, Quadrivalent, Recombinant, Inj, Pf 09/02/2019  . Influenza-Unspecified 07/20/2015, 07/30/2018  . Tdap 02/12/2013  . Zoster 02/16/2014    Hypertension This is a chronic problem. The problem is controlled. Pertinent negatives include no chest pain, headaches, palpitations or shortness of breath. Past treatments include angiotensin blockers, diuretics and beta blockers. The current treatment provides significant improvement. There are no compliance problems.    OSA - on CPAP nightly with good rest and no daytime somnolence.  Afib  - taking metoprolol 75 mg once a day and aspirin.  He has not had any further Afib.  He also has some CAD that is being managed medically.  Last Cardiology note 10/2018: 2. Chest pain with moderate risk of acute coronary syndrome-no evidence of ischemia on baseline electrocardiogram functional study showed moderate reduced LV function EF 46% with no reversible ischemia. Echo showed a similar ejection fraction with no high-grade valvular disease. His right side is slightly enlarged. No high-grade ischemia on cardiac MRI with an EF of 42% by MRI. We will continue with medical management and follow-up. R07.9     ECHO 04/2019: INTERPRETATION MILD LV SYSTOLIC DYSFUNCTION (See above) NORMAL RIGHT VENTRICULAR SYSTOLIC FUNCTION MILD VALVULAR REGURGITATION (See above) NO VALVULAR STENOSIS Closest EF: 45-50% (Estimated) Contraction: MILD GLOBAL DECREASE Mitral: MILD MR Tricuspid: MILD TR  Lab Results  Component Value Date   CREATININE 1.31  (H) 09/28/2019   BUN 17 09/28/2019   NA 141 09/28/2019   K 2.9 (L) 09/28/2019   CL 102 09/28/2019   CO2 26 09/28/2019   Lab Results  Component Value Date   CHOL 180 02/25/2019   HDL 38 (L) 02/25/2019   LDLCALC 114 (H) 02/25/2019   TRIG 140 02/25/2019   CHOLHDL 4.7 02/25/2019   Lab Results  Component Value Date   TSH 1.90 02/17/2015   No results found for: HGBA1C Lab Results  Component Value Date   WBC 11.5 (H) 09/28/2019   HGB 14.9 09/28/2019   HCT 45.9 09/28/2019   MCV 91.1 09/28/2019   PLT 237 09/28/2019   Lab Results  Component Value Date   ALT 26 02/25/2019   AST 17 02/25/2019   ALKPHOS 50 02/25/2019   BILITOT 0.5 02/25/2019     Review of Systems  Constitutional: Negative for appetite change, chills, diaphoresis, fatigue and unexpected weight change.  HENT: Negative for hearing loss, tinnitus, trouble swallowing and voice change.   Eyes: Negative for visual disturbance.  Respiratory: Negative for choking, shortness of breath and wheezing.   Cardiovascular: Negative for chest pain, palpitations and leg swelling.  Gastrointestinal: Negative for abdominal pain, blood in stool, constipation and diarrhea.  Genitourinary: Negative for difficulty urinating, dysuria, frequency and urgency.  Musculoskeletal: Negative for arthralgias, back pain and myalgias.  Skin: Negative for color change and rash.  Allergic/Immunologic: Negative for environmental allergies.  Neurological: Negative for dizziness, syncope and headaches.  Hematological: Negative for adenopathy.  Psychiatric/Behavioral: Negative for dysphoric mood and sleep disturbance.    Patient Active Problem List   Diagnosis Date Noted  . OSA  on CPAP 06/20/2019  . BPH associated with nocturia 02/21/2018  . Essential (primary) hypertension 07/26/2015  . History of colon polyps 07/26/2015  . H/O malignant neoplasm of skin 07/26/2015  . Plantar fasciitis 07/26/2015    Allergies  Allergen Reactions  .  Cyclobenzaprine Other (See Comments)    Acute urinary retention    Past Surgical History:  Procedure Laterality Date  . COLONOSCOPY  01/2011   repeat 3 yrs - @ [facility] internal medicine  . HERNIA REPAIR  1992    Social History   Tobacco Use  . Smoking status: Former Smoker    Packs/day: 0.50    Years: 5.00    Pack years: 2.50    Types: Cigarettes    Quit date: 10/17/1987    Years since quitting: 32.3  . Smokeless tobacco: Never Used  Substance Use Topics  . Alcohol use: No    Alcohol/week: 0.0 standard drinks  . Drug use: Never     Medication list has been reviewed and updated.  Current Meds  Medication Sig  . aspirin EC 81 MG tablet Take by mouth.  . fluticasone (FLONASE) 50 MCG/ACT nasal spray Place into the nose.  . metoprolol succinate (TOPROL-XL) 50 MG 24 hr tablet Take 50 mg by mouth daily.  . NON FORMULARY CPAP Nightly.  . valsartan-hydrochlorothiazide (DIOVAN-HCT) 320-25 MG tablet Take 1 tablet by mouth once daily    PHQ 2/9 Scores 02/26/2020 06/20/2019 03/20/2019 02/25/2019  PHQ - 2 Score 0 0 0 0  PHQ- 9 Score 0 0 - -    BP Readings from Last 3 Encounters:  02/26/20 114/70  09/28/19 116/69  06/20/19 117/78    Physical Exam Vitals and nursing note reviewed.  Constitutional:      Appearance: Normal appearance. He is well-developed.  HENT:     Head: Normocephalic.     Right Ear: Tympanic membrane, ear canal and external ear normal.     Left Ear: Tympanic membrane, ear canal and external ear normal.     Nose: Nose normal.     Mouth/Throat:     Pharynx: Uvula midline.  Eyes:     Conjunctiva/sclera: Conjunctivae normal.     Pupils: Pupils are equal, round, and reactive to light.  Neck:     Thyroid: No thyromegaly.     Vascular: No carotid bruit.  Cardiovascular:     Rate and Rhythm: Normal rate and regular rhythm.     Heart sounds: Normal heart sounds.  Pulmonary:     Effort: Pulmonary effort is normal.     Breath sounds: Normal breath sounds.  No wheezing.  Chest:     Breasts:        Right: No mass.        Left: No mass.  Abdominal:     General: Bowel sounds are normal.     Palpations: Abdomen is soft.     Tenderness: There is no abdominal tenderness.  Musculoskeletal:        General: Normal range of motion.     Cervical back: Normal range of motion and neck supple.  Lymphadenopathy:     Cervical: No cervical adenopathy.  Skin:    General: Skin is warm and dry.  Neurological:     Mental Status: He is alert and oriented to person, place, and time.     Deep Tendon Reflexes: Reflexes are normal and symmetric.  Psychiatric:        Speech: Speech normal.  Behavior: Behavior normal.        Thought Content: Thought content normal.        Judgment: Judgment normal.     Wt Readings from Last 3 Encounters:  02/26/20 232 lb (105.2 kg)  09/28/19 235 lb (106.6 kg)  06/20/19 234 lb (106.1 kg)    BP 114/70   Pulse (!) 59   Temp (!) 97.5 F (36.4 C) (Temporal)   Ht 6\' 1"  (1.854 m)   Wt 232 lb (105.2 kg)   SpO2 96%   BMI 30.61 kg/m   Assessment and Plan: 1. Annual physical exam Normal exam except for weight Discussed diet changes to help with weight loss - Lipid panel - POCT urinalysis dipstick  2. Essential (primary) hypertension Clinically stable exam with well controlled BP. Tolerating medications without side effects at this time. Pt to continue current regimen and low sodium diet; benefits of regular exercise as able discussed. - CBC with Differential/Platelet - Comprehensive metabolic panel  3. Paroxysmal atrial fibrillation (HCC) On beta blocker and aspirin daily Followed by Cardiology Dr. Rockey Situ No recent episodes per patient report Should probably be on a statin -  4. Colon cancer screening Last done at [facility] - polyps in 2012 - Ambulatory referral to Gastroenterology  5. Prostate cancer screening DRE deferred - PSA   Partially dictated using Dragon software. Any errors are  unintentional.  Halina Maidens, MD [facility]  02/26/2020

## 2020-02-26 NOTE — Patient Instructions (Signed)
DASH Eating Plan DASH stands for "Dietary Approaches to Stop Hypertension." The DASH eating plan is a healthy eating plan that has been shown to reduce high blood pressure (hypertension). It may also reduce your risk for type 2 diabetes, heart disease, and stroke. The DASH eating plan may also help with weight loss. What are tips for following this plan?  General guidelines  Avoid eating more than 2,300 mg (milligrams) of salt (sodium) a day. If you have hypertension, you may need to reduce your sodium intake to 1,500 mg a day.  Limit alcohol intake to no more than 1 drink a day for nonpregnant women and 2 drinks a day for men. One drink equals 12 oz of beer, 5 oz of wine, or 1 oz of hard liquor.  Work with your health care provider to maintain a healthy body weight or to lose weight. Ask what an ideal weight is for you.  Get at least 30 minutes of exercise that causes your heart to beat faster (aerobic exercise) most days of the week. Activities may include walking, swimming, or biking.  Work with your health care provider or diet and nutrition specialist (dietitian) to adjust your eating plan to your individual calorie needs. Reading food labels   Check food labels for the amount of sodium per serving. Choose foods with less than 5 percent of the Daily Value of sodium. Generally, foods with less than 300 mg of sodium per serving fit into this eating plan.  To find whole grains, look for the word "whole" as the first word in the ingredient list. Shopping  Buy products labeled as "low-sodium" or "no salt added."  Buy fresh foods. Avoid canned foods and premade or frozen meals. Cooking  Avoid adding salt when cooking. Use salt-free seasonings or herbs instead of table salt or sea salt. Check with your health care provider or pharmacist before using salt substitutes.  Do not fry foods. Cook foods using healthy methods such as baking, boiling, grilling, and broiling instead.  Cook with  heart-healthy oils, such as olive, canola, soybean, or sunflower oil. Meal planning  Eat a balanced diet that includes: ? 5 or more servings of fruits and vegetables each day. At each meal, try to fill half of your plate with fruits and vegetables. ? Up to 6-8 servings of whole grains each day. ? Less than 6 oz of lean meat, poultry, or fish each day. A 3-oz serving of meat is about the same size as a deck of cards. One egg equals 1 oz. ? 2 servings of low-fat dairy each day. ? A serving of nuts, seeds, or beans 5 times each week. ? Heart-healthy fats. Healthy fats called Omega-3 fatty acids are found in foods such as flaxseeds and coldwater fish, like sardines, salmon, and mackerel.  Limit how much you eat of the following: ? Canned or prepackaged foods. ? Food that is high in trans fat, such as fried foods. ? Food that is high in saturated fat, such as fatty meat. ? Sweets, desserts, sugary drinks, and other foods with added sugar. ? Full-fat dairy products.  Do not salt foods before eating.  Try to eat at least 2 vegetarian meals each week.  Eat more home-cooked food and less restaurant, buffet, and fast food.  When eating at a restaurant, ask that your food be prepared with less salt or no salt, if possible. What foods are recommended? The items listed may not be a complete list. Talk with your dietitian about   what dietary choices are best for you. Grains Whole-grain or whole-wheat bread. Whole-grain or whole-wheat pasta. Brown rice. Oatmeal. Quinoa. Bulgur. Whole-grain and low-sodium cereals. Pita bread. Low-fat, low-sodium crackers. Whole-wheat flour tortillas. Vegetables Fresh or frozen vegetables (raw, steamed, roasted, or grilled). Low-sodium or reduced-sodium tomato and vegetable juice. Low-sodium or reduced-sodium tomato sauce and tomato paste. Low-sodium or reduced-sodium canned vegetables. Fruits All fresh, dried, or frozen fruit. Canned fruit in natural juice (without  added sugar). Meat and other protein foods Skinless chicken or turkey. Ground chicken or turkey. Pork with fat trimmed off. Fish and seafood. Egg whites. Dried beans, peas, or lentils. Unsalted nuts, nut butters, and seeds. Unsalted canned beans. Lean cuts of beef with fat trimmed off. Low-sodium, lean deli meat. Dairy Low-fat (1%) or fat-free (skim) milk. Fat-free, low-fat, or reduced-fat cheeses. Nonfat, low-sodium ricotta or cottage cheese. Low-fat or nonfat yogurt. Low-fat, low-sodium cheese. Fats and oils Soft margarine without trans fats. Vegetable oil. Low-fat, reduced-fat, or light mayonnaise and salad dressings (reduced-sodium). Canola, safflower, olive, soybean, and sunflower oils. Avocado. Seasoning and other foods Herbs. Spices. Seasoning mixes without salt. Unsalted popcorn and pretzels. Fat-free sweets. What foods are not recommended? The items listed may not be a complete list. Talk with your dietitian about what dietary choices are best for you. Grains Baked goods made with fat, such as croissants, muffins, or some breads. Dry pasta or rice meal packs. Vegetables Creamed or fried vegetables. Vegetables in a cheese sauce. Regular canned vegetables (not low-sodium or reduced-sodium). Regular canned tomato sauce and paste (not low-sodium or reduced-sodium). Regular tomato and vegetable juice (not low-sodium or reduced-sodium). Pickles. Olives. Fruits Canned fruit in a light or heavy syrup. Fried fruit. Fruit in cream or butter sauce. Meat and other protein foods Fatty cuts of meat. Ribs. Fried meat. Bacon. Sausage. Bologna and other processed lunch meats. Salami. Fatback. Hotdogs. Bratwurst. Salted nuts and seeds. Canned beans with added salt. Canned or smoked fish. Whole eggs or egg yolks. Chicken or turkey with skin. Dairy Whole or 2% milk, cream, and half-and-half. Whole or full-fat cream cheese. Whole-fat or sweetened yogurt. Full-fat cheese. Nondairy creamers. Whipped toppings.  Processed cheese and cheese spreads. Fats and oils Butter. Stick margarine. Lard. Shortening. Ghee. Bacon fat. Tropical oils, such as coconut, palm kernel, or palm oil. Seasoning and other foods Salted popcorn and pretzels. Onion salt, garlic salt, seasoned salt, table salt, and sea salt. Worcestershire sauce. Tartar sauce. Barbecue sauce. Teriyaki sauce. Soy sauce, including reduced-sodium. Steak sauce. Canned and packaged gravies. Fish sauce. Oyster sauce. Cocktail sauce. Horseradish that you find on the shelf. Ketchup. Mustard. Meat flavorings and tenderizers. Bouillon cubes. Hot sauce and Tabasco sauce. Premade or packaged marinades. Premade or packaged taco seasonings. Relishes. Regular salad dressings. Where to find more information:  National Heart, Lung, and Blood Institute: www.nhlbi.nih.gov  American Heart Association: www.heart.org Summary  The DASH eating plan is a healthy eating plan that has been shown to reduce high blood pressure (hypertension). It may also reduce your risk for type 2 diabetes, heart disease, and stroke.  With the DASH eating plan, you should limit salt (sodium) intake to 2,300 mg a day. If you have hypertension, you may need to reduce your sodium intake to 1,500 mg a day.  When on the DASH eating plan, aim to eat more fresh fruits and vegetables, whole grains, lean proteins, low-fat dairy, and heart-healthy fats.  Work with your health care provider or diet and nutrition specialist (dietitian) to adjust your eating plan to your   individual calorie needs. This information is not intended to replace advice given to you by your health care provider. Make sure you discuss any questions you have with your health care provider. Document Revised: 09/14/2017 Document Reviewed: 09/25/2016 Elsevier Patient Education  2020 [employer] Inc.  

## 2020-02-27 LAB — CBC WITH DIFFERENTIAL/PLATELET
Basophils Absolute: 0 10*3/uL (ref 0.0–0.2)
Basos: 1 %
EOS (ABSOLUTE): 0.2 10*3/uL (ref 0.0–0.4)
Eos: 2 %
Hematocrit: 44.1 % (ref 37.5–51.0)
Hemoglobin: 15 g/dL (ref 13.0–17.7)
Immature Grans (Abs): 0 10*3/uL (ref 0.0–0.1)
Immature Granulocytes: 0 %
Lymphocytes Absolute: 2.1 10*3/uL (ref 0.7–3.1)
Lymphs: 30 %
MCH: 31.1 pg (ref 26.6–33.0)
MCHC: 34 g/dL (ref 31.5–35.7)
MCV: 91 fL (ref 79–97)
Monocytes Absolute: 0.7 10*3/uL (ref 0.1–0.9)
Monocytes: 9 %
Neutrophils Absolute: 4.1 10*3/uL (ref 1.4–7.0)
Neutrophils: 58 %
Platelets: 232 10*3/uL (ref 150–450)
RBC: 4.83 x10E6/uL (ref 4.14–5.80)
RDW: 12.8 % (ref 11.6–15.4)
WBC: 7.1 10*3/uL (ref 3.4–10.8)

## 2020-02-27 LAB — COMPREHENSIVE METABOLIC PANEL
ALT: 26 IU/L (ref 0–44)
AST: 18 IU/L (ref 0–40)
Albumin/Globulin Ratio: 1.8 (ref 1.2–2.2)
Albumin: 4.4 g/dL (ref 3.8–4.9)
Alkaline Phosphatase: 50 IU/L (ref 39–117)
BUN/Creatinine Ratio: 11 (ref 9–20)
BUN: 14 mg/dL (ref 6–24)
Bilirubin Total: 0.4 mg/dL (ref 0.0–1.2)
CO2: 28 mmol/L (ref 20–29)
Calcium: 9.4 mg/dL (ref 8.7–10.2)
Chloride: 102 mmol/L (ref 96–106)
Creatinine, Ser: 1.24 mg/dL (ref 0.76–1.27)
GFR calc Af Amer: 75 mL/min/{1.73_m2} (ref >59)
GFR calc non Af Amer: 65 mL/min/{1.73_m2} (ref >59)
Globulin, Total: 2.5 g/dL (ref 1.5–4.5)
Glucose: 104 mg/dL — ABNORMAL HIGH (ref 65–99)
Potassium: 4.4 mmol/L (ref 3.5–5.2)
Sodium: 141 mmol/L (ref 134–144)
Total Protein: 6.9 g/dL (ref 6.0–8.5)

## 2020-02-27 LAB — LIPID PANEL
Chol/HDL Ratio: 4.9 ratio (ref 0.0–5.0)
Cholesterol, Total: 153 mg/dL (ref 100–199)
HDL: 31 mg/dL — ABNORMAL LOW (ref >39)
LDL Chol Calc (NIH): 98 mg/dL (ref 0–99)
Triglycerides: 133 mg/dL (ref 0–149)
VLDL Cholesterol Cal: 24 mg/dL (ref 5–40)

## 2020-02-27 LAB — PSA: Prostate Specific Ag, Serum: 2.6 ng/mL (ref 0.0–4.0)

## 2020-05-04 DIAGNOSIS — I2510 Atherosclerotic heart disease of native coronary artery without angina pectoris: Secondary | ICD-10-CM | POA: Insufficient documentation

## 2020-06-19 ENCOUNTER — Emergency Department
Admission: EM | Admit: 2020-06-19 | Discharge: 2020-06-19 | Disposition: A | Discharge status: Home / Self Care | Payer: BC Managed Care – PPO | Attending: Emergency Medicine | Admitting: Emergency Medicine

## 2020-06-19 ENCOUNTER — Encounter: Payer: Self-pay | Admitting: Intensive Care

## 2020-06-19 ENCOUNTER — Emergency Department: Payer: BC Managed Care – PPO

## 2020-06-19 ENCOUNTER — Other Ambulatory Visit: Payer: Self-pay

## 2020-06-19 DIAGNOSIS — Z5321 Procedure and treatment not carried out due to patient leaving prior to being seen by health care provider: Secondary | ICD-10-CM | POA: Diagnosis not present

## 2020-06-19 DIAGNOSIS — R079 Chest pain, unspecified: Secondary | ICD-10-CM | POA: Insufficient documentation

## 2020-06-19 DIAGNOSIS — R002 Palpitations: Secondary | ICD-10-CM | POA: Insufficient documentation

## 2020-06-19 LAB — CBC
HCT: 43.4 % (ref 39.0–52.0)
Hemoglobin: 14.5 g/dL (ref 13.0–17.0)
MCH: 30.1 pg (ref 26.0–34.0)
MCHC: 33.4 g/dL (ref 30.0–36.0)
MCV: 90 fL (ref 80.0–100.0)
Platelets: 236 10*3/uL (ref 150–400)
RBC: 4.82 MIL/uL (ref 4.22–5.81)
RDW: 12.6 % (ref 11.5–15.5)
WBC: 9 10*3/uL (ref 4.0–10.5)
nRBC: 0 % (ref 0.0–0.2)

## 2020-06-19 LAB — BASIC METABOLIC PANEL
Anion gap: 8 (ref 5–15)
BUN: 13 mg/dL (ref 6–20)
CO2: 27 mmol/L (ref 22–32)
Calcium: 9.7 mg/dL (ref 8.9–10.3)
Chloride: 101 mmol/L (ref 98–111)
Creatinine, Ser: 0.97 mg/dL (ref 0.61–1.24)
GFR calc Af Amer: >60 mL/min (ref >60)
GFR calc non Af Amer: >60 mL/min (ref >60)
Glucose, Bld: 116 mg/dL — ABNORMAL HIGH (ref 70–99)
Potassium: 3.3 mmol/L — ABNORMAL LOW (ref 3.5–5.1)
Sodium: 136 mmol/L (ref 135–145)

## 2020-06-19 LAB — TROPONIN I (HIGH SENSITIVITY): Troponin I (High Sensitivity): 11 ng/L (ref <18)

## 2020-06-19 NOTE — ED Notes (Signed)
After doing vital check, pt states he feels much better and is leaving.  Pt advised if sx return, worsen, or change to return to ED.  Pt agreeable

## 2020-06-19 NOTE — ED Triage Notes (Signed)
Patient c/o chest pain that started this AM. HX A-fib. Denies taking blood thinner. Reports having spells where he feels his heart flutter and could pass out/chest pains. Patient had spell in triage where HR went up to 125 and then back down to 70s.

## 2020-06-27 ENCOUNTER — Other Ambulatory Visit: Payer: Self-pay

## 2020-06-27 ENCOUNTER — Emergency Department
Admission: EM | Admit: 2020-06-27 | Discharge: 2020-06-27 | Disposition: A | Discharge status: Home / Self Care | Payer: BC Managed Care – PPO | Attending: Emergency Medicine | Admitting: Emergency Medicine

## 2020-06-27 ENCOUNTER — Encounter: Payer: Self-pay | Admitting: Emergency Medicine

## 2020-06-27 DIAGNOSIS — R339 Retention of urine, unspecified: Secondary | ICD-10-CM | POA: Insufficient documentation

## 2020-06-27 DIAGNOSIS — I10 Essential (primary) hypertension: Secondary | ICD-10-CM | POA: Insufficient documentation

## 2020-06-27 DIAGNOSIS — Z7901 Long term (current) use of anticoagulants: Secondary | ICD-10-CM | POA: Insufficient documentation

## 2020-06-27 DIAGNOSIS — Z7982 Long term (current) use of aspirin: Secondary | ICD-10-CM | POA: Diagnosis not present

## 2020-06-27 DIAGNOSIS — I4891 Unspecified atrial fibrillation: Secondary | ICD-10-CM | POA: Diagnosis not present

## 2020-06-27 DIAGNOSIS — T50995A Adverse effect of other drugs, medicaments and biological substances, initial encounter: Secondary | ICD-10-CM | POA: Diagnosis not present

## 2020-06-27 DIAGNOSIS — R338 Other retention of urine: Secondary | ICD-10-CM

## 2020-06-27 DIAGNOSIS — Z85828 Personal history of other malignant neoplasm of skin: Secondary | ICD-10-CM | POA: Insufficient documentation

## 2020-06-27 DIAGNOSIS — Y929 Unspecified place or not applicable: Secondary | ICD-10-CM | POA: Diagnosis not present

## 2020-06-27 DIAGNOSIS — Z79899 Other long term (current) drug therapy: Secondary | ICD-10-CM | POA: Diagnosis not present

## 2020-06-27 DIAGNOSIS — X58XXXA Exposure to other specified factors, initial encounter: Secondary | ICD-10-CM | POA: Diagnosis not present

## 2020-06-27 DIAGNOSIS — Y939 Activity, unspecified: Secondary | ICD-10-CM | POA: Diagnosis not present

## 2020-06-27 DIAGNOSIS — Y999 Unspecified external cause status: Secondary | ICD-10-CM | POA: Insufficient documentation

## 2020-06-27 DIAGNOSIS — Z87891 Personal history of nicotine dependence: Secondary | ICD-10-CM | POA: Insufficient documentation

## 2020-06-27 DIAGNOSIS — T50905A Adverse effect of unspecified drugs, medicaments and biological substances, initial encounter: Secondary | ICD-10-CM

## 2020-06-27 LAB — URINALYSIS, COMPLETE (UACMP) WITH MICROSCOPIC
Bacteria, UA: NONE SEEN
Bilirubin Urine: NEGATIVE
Glucose, UA: NEGATIVE mg/dL
Ketones, ur: NEGATIVE mg/dL
Leukocytes,Ua: NEGATIVE
Nitrite: NEGATIVE
Protein, ur: NEGATIVE mg/dL
Specific Gravity, Urine: 1.01 (ref 1.005–1.030)
pH: 5 (ref 5.0–8.0)

## 2020-06-27 NOTE — ED Provider Notes (Signed)
[facility] Emergency Department Provider Note  ____________________________________________  Time seen: Approximately 12:50 PM  I have reviewed the triage vital signs and the nursing notes.   HISTORY  Chief Complaint Urinary Retention    HPI John Hanna is a 59 y.o. male who presents to the emergency department for treatment and evaluation of urinary retention that started this morning.  Patient states that he had some "cold symptoms" and was advised by his cardiologist that he should try to take Claritin.  He states he took 3 days of Claritin then developed urinary retention symptoms.  This has happened in the past with a different medication.  He has been able to only urinate scant amounts today.   Past Medical History:  Diagnosis Date  . Arthritis   . Atrial fibrillation ([place])   . Hypertension     Patient Active Problem List   Diagnosis Date Noted  . Paroxysmal atrial fibrillation ([place]) 02/26/2020  . OSA on CPAP 06/20/2019  . BPH associated with nocturia 02/21/2018  . Essential (primary) hypertension 07/26/2015  . History of colon polyps 07/26/2015  . H/O malignant neoplasm of skin 07/26/2015  . Plantar fasciitis 07/26/2015    Past Surgical History:  Procedure Laterality Date  . COLONOSCOPY  01/2011   repeat 3 yrs - @ [facility] internal medicine  . HERNIA REPAIR  1992    Prior to Admission medications   Medication Sig Start Date End Date Taking? Authorizing Provider  aspirin EC 81 MG tablet Take by mouth.    [provider]  fluticasone (FLONASE) 50 MCG/ACT nasal spray Place into the nose.    [provider]  metoprolol succinate (TOPROL-XL) 50 MG 24 hr tablet Take 50 mg by mouth daily. 05/29/19   [provider]  NON FORMULARY CPAP Nightly.    [provider]  valsartan-hydrochlorothiazide (DIOVAN-HCT) 320-25 MG tablet Take 1 tablet by mouth once daily 08/24/19   [name], MD     Allergies Cyclobenzaprine  Family History  Problem Relation Age of Onset  . Colon cancer Mother        died age 62  . Hypertension Father   . Prostate cancer Father 85    Social History Social History   Tobacco Use  . Smoking status: Former Smoker    Packs/day: 0.50    Years: 5.00    Pack years: 2.50    Types: Cigarettes    Quit date: 10/17/1987    Years since quitting: 32.7  . Smokeless tobacco: Never Used  Vaping Use  . Vaping Use: Never used  Substance Use Topics  . Alcohol use: No    Alcohol/week: 0.0 standard drinks  . Drug use: Never    Review of Systems Constitutional: Negative for fever. Respiratory: Negative for shortness of breath or cough. Gastrointestinal: Negative for abdominal pain; negative for nausea , negative for vomiting. Genitourinary: Negative for dysuria , negative for penile discharge. Musculoskeletal: Negative for back pain. Skin: Negative for acute skin changes/rash/lesion. ____________________________________________   PHYSICAL EXAM:  VITAL SIGNS: ED Triage Vitals  Enc Vitals Group     BP 06/27/20 1055 (!) 166/77     Pulse Rate 06/27/20 1055 (!) 54     Resp 06/27/20 1055 17     Temp 06/27/20 1055 98.8 F (37.1 C)     Temp Source 06/27/20 1055 Oral     SpO2 06/27/20 1055 100 %     Weight 06/27/20 1056 232 lb (105.2 kg)  Height 06/27/20 1056 6\' 2"  (1.88 m)     Head Circumference --      Peak Flow --      Pain Score --      Pain Loc --      Pain Edu? --      Excl. in [place]? --     Constitutional: Alert and oriented. Well appearing and in no acute distress. Eyes: Conjunctivae are normal. Head: Atraumatic. Nose: No congestion/rhinnorhea. Mouth/Throat: Mucous membranes are moist. Respiratory: Normal respiratory effort.  No retractions. Gastrointestinal: Bowel sounds active x 4; Abdomen is soft without rebound or guarding. Genitourinary: Circumcised male Musculoskeletal: No extremity tenderness nor edema.  Neurologic:   Normal speech and language. No gross focal neurologic deficits are appreciated. Speech is normal. No gait instability. Skin:  Skin is warm, dry and intact. No rash noted on exposed skin. Psychiatric: Mood and affect are normal. Speech and behavior are normal.  ____________________________________________   LABS (all labs ordered are listed, but only abnormal results are displayed)  Labs Reviewed  URINALYSIS, COMPLETE (UACMP) WITH MICROSCOPIC - Abnormal; Notable for the following components:      Result Value   Color, Urine YELLOW (*)    APPearance HAZY (*)    Hgb urine dipstick SMALL (*)    All other components within normal limits   ____________________________________________  RADIOLOGY  Not indicated ____________________________________________  Procedures  ____________________________________________  59 year old male presenting to the emergency department for treatment and evaluation of urinary retention.  See HPI for further details.  While awaiting ER room assignment, Foley catheter was inserted after bladder scan revealed over 700 mL of retained urine.  Catheter is draining well.  Urinalysis is reassuring.  Urinary retention is most likely related to antihistamine.  Plan will be to leave the Foley in until he can be evaluated further by urology.  Foley catheter care discussed with the patient.  He will be provided a leg bag for use during the day and instructed to use the gravity bag at night.  INITIAL IMPRESSION / ASSESSMENT AND PLAN / ED COURSE  Pertinent labs & imaging results that were available during my care of the patient were reviewed by me and considered in my medical decision making (see chart for details).  ____________________________________________   FINAL CLINICAL IMPRESSION(S) / ED DIAGNOSES  Final diagnoses:  Acute urinary retention  Adverse effect of over-the-counter medication, initial encounter    Note:  This document was prepared using Dragon  voice recognition software and may include unintentional dictation errors.   Victorino Dike, FNP 06/27/20 1303    Arta Silence, MD 06/27/20 1331

## 2020-06-27 NOTE — ED Notes (Signed)
Pt back to room in NAD. See triage note.

## 2020-06-27 NOTE — Discharge Instructions (Signed)
Please call and schedule follow-up appointment with urology.  Please follow the instructions on Foley catheter care to help prevent urinary tract infection.

## 2020-06-27 NOTE — ED Triage Notes (Signed)
Patient presents to the ED with urinary retention and history of urinary retention.  Patient states he has been urinating but very small amounts.  This RN performed a post void bladder scan that revealed 768ml of urine in bladder.

## 2020-07-06 NOTE — Progress Notes (Signed)
07/07/2020 9:45 PM   John Hanna [birth year] [id number]  Referring provider: [name], MD [address] [place],  [place] [address] Chief Complaint  Patient presents with  . Urinary Retention    HPI: John Hanna is a 59 y.o. male who presents today for evaluation of urinary retention. Patient is accompanied by wife.   He was presented to the ED on 06/27/20 for urinary retention that started in the AM. He was advised to take Claritin per cardiologist for cold like symptoms. He later developed urinary retention symptoms. He reported this has happened in the past with another medication. He was able to urinated scant amounts.   Foley catheter was inserted after bladder scan revealed over 700 mL of retained urine.  Catheter was draining well.  Urinalysis was reassuring.  Urinary retention was most likely related to antihistamine.  Plan was to leave the Foley in until evaluated by urology.  The patient has seen Dr. Bernardo Heater in the past for similar symptoms. Before Claritin he had a slow stream but denied trouble emptying. Denies issues with constipation or UTI.   Patient underwent fill and pull voiding trial. He was only able to empty a little over half.    PMH: Past Medical History:  Diagnosis Date  . Arthritis   . Atrial fibrillation ([place])   . Hypertension     Surgical History: Past Surgical History:  Procedure Laterality Date  . COLONOSCOPY  01/2011   repeat 3 yrs - @ [facility] internal medicine  . HERNIA REPAIR  1992    Home Medications:  Allergies as of 07/07/2020      Reactions   Cyclobenzaprine Other (See Comments)   Acute urinary retention      Medication List       Accurate as of July 07, 2020  9:45 PM. If you have any questions, ask your nurse or doctor.        aspirin EC 81 MG tablet Take 81 mg by mouth daily.   fluticasone 50 MCG/ACT nasal spray Commonly known as: FLONASE Place into the nose.   metoprolol succinate  50 MG 24 hr tablet Commonly known as: TOPROL-XL Take 75 mg by mouth daily.   NON FORMULARY CPAP Nightly.   potassium chloride 10 MEQ tablet Commonly known as: KLOR-CON Take 10 mEq by mouth daily.   tamsulosin 0.4 MG Caps capsule Commonly known as: FLOMAX Take 1 capsule (0.4 mg total) by mouth daily. Started by: [name], MD   valsartan-hydrochlorothiazide 320-25 MG tablet Commonly known as: DIOVAN-HCT Take 1 tablet by mouth once daily       Allergies:  Allergies  Allergen Reactions  . Cyclobenzaprine Other (See Comments)    Acute urinary retention    Family History: Family History  Problem Relation Age of Onset  . Colon cancer Mother        died age 27  . Hypertension Father   . Prostate cancer Father 8    Social History:  reports that he quit smoking about 32 years ago. His smoking use included cigarettes. He has a 2.50 pack-year smoking history. He has never used smokeless tobacco. He reports that he does not drink alcohol and does not use drugs.   Physical Exam: BP (!) 143/81 (BP Location: Left Arm, Patient Position: Sitting, Cuff Size: Normal)   Pulse (!) 52   Ht 6\' 2"  (1.88 m)   Wt 223 lb 12.8 oz (101.5 kg)   BMI 28.73 kg/m   Constitutional:  Alert and oriented, No acute distress.  Accompanied by wife today. HEENT: [place] AT, moist mucus membranes.  Trachea midline, no masses. Cardiovascular: No clubbing, cyanosis, or edema. Respiratory: Normal respiratory effort, no increased work of breathing. Skin: No rashes, bruises or suspicious lesions. Neurologic: Grossly intact, no focal deficits, moving all 4 extremities. Psychiatric: Normal mood and affect.  Laboratory Data:  Lab Results  Component Value Date   CREATININE 0.97 06/19/2020    Assessment & Plan:    1. BPH with urinary retention Mostly successful fill and voiding trial- was able to empty >50% today upon foley removal Will have patient return to office this afternoon if he can not empty  on his own.  Encourage patient to push fluids -Flomax Rx sent.  Lengthy discussion today about the pathophysiology of BPH and urinary retention.  He likely has underlying BPH exacerbated by recent cold medicine.  We discussed that given that he has had urinary retention on multiple occasions this point time, he will likely need chronic pharmacotherapy versus procedural/surgical intervention.  We discussed various options.  He may be interested in [place].  We will discuss this further at his follow-up.  2. PSA screening Will check PSA and perform a DRE at next appointment  Follow up in 1 month with IPSS/PVR/PSA/DRE  Return in about 4 weeks (around 08/04/2020), or 1 month with IPSS, PVR, DRE, PSA.  [facility] [address], [place], [place] [address] [phone]  I, Selena Batten, am acting as a scribe for Dr. [name].  I have reviewed the above documentation for accuracy and completeness, and I agree with the above.   [name], MD  I spent 30 total minutes on the day of the encounter including pre-visit review of the medical record, face-to-face time with the patient, and post visit ordering of labs/imaging/tests.

## 2020-07-07 ENCOUNTER — Encounter: Payer: Self-pay | Admitting: Urology

## 2020-07-07 ENCOUNTER — Ambulatory Visit: Payer: BC Managed Care – PPO | Admitting: Physician Assistant

## 2020-07-07 ENCOUNTER — Ambulatory Visit: Payer: BC Managed Care – PPO | Admitting: Urology

## 2020-07-07 ENCOUNTER — Other Ambulatory Visit: Payer: Self-pay

## 2020-07-07 VITALS — BP 143/81 | HR 52 | Ht 74.0 in | Wt 223.8 lb

## 2020-07-07 DIAGNOSIS — R339 Retention of urine, unspecified: Secondary | ICD-10-CM | POA: Diagnosis not present

## 2020-07-07 MED ORDER — TAMSULOSIN HCL 0.4 MG PO CAPS: 0.4000 mg | ORAL_CAPSULE | Freq: Every day | ORAL | 11 refills | Status: DC

## 2020-07-07 NOTE — Progress Notes (Signed)
Fill and Pull Catheter Removal  Patient is present today for a catheter removal.  Patient was cleaned and prepped in a sterile fashion 245ml of sterile water/ saline was instilled into the bladder when the patient felt the urge to urinate. 53ml of water was then drained from the balloon.  A 14FR foley cath was removed from the bladder no complications were noted .  Patient was then given some time to void on their own.  Patient can void  150 ml on their own after some time.  Patient tolerated well.  Performed by: Bradly Bienenstock, CMA  Follow up/ Additional notes: RTC this afternoon.

## 2020-07-12 ENCOUNTER — Other Ambulatory Visit: Payer: Self-pay

## 2020-07-12 ENCOUNTER — Inpatient Hospital Stay
Admission: EM | Admit: 2020-07-12 | Discharge: 2020-07-16 | DRG: 287 | Disposition: A | Discharge status: Other Acute Inpatient Hospital | Payer: BC Managed Care – PPO | Attending: Internal Medicine | Admitting: Internal Medicine

## 2020-07-12 ENCOUNTER — Emergency Department: Payer: BC Managed Care – PPO

## 2020-07-12 DIAGNOSIS — I2510 Atherosclerotic heart disease of native coronary artery without angina pectoris: Secondary | ICD-10-CM | POA: Diagnosis present

## 2020-07-12 DIAGNOSIS — N401 Enlarged prostate with lower urinary tract symptoms: Secondary | ICD-10-CM | POA: Diagnosis present

## 2020-07-12 DIAGNOSIS — I10 Essential (primary) hypertension: Secondary | ICD-10-CM | POA: Diagnosis present

## 2020-07-12 DIAGNOSIS — Z8249 Family history of ischemic heart disease and other diseases of the circulatory system: Secondary | ICD-10-CM

## 2020-07-12 DIAGNOSIS — I495 Sick sinus syndrome: Secondary | ICD-10-CM | POA: Diagnosis present

## 2020-07-12 DIAGNOSIS — Z888 Allergy status to other drugs, medicaments and biological substances status: Secondary | ICD-10-CM

## 2020-07-12 DIAGNOSIS — Z87891 Personal history of nicotine dependence: Secondary | ICD-10-CM

## 2020-07-12 DIAGNOSIS — I480 Paroxysmal atrial fibrillation: Secondary | ICD-10-CM | POA: Diagnosis not present

## 2020-07-12 DIAGNOSIS — I472 Ventricular tachycardia: Secondary | ICD-10-CM | POA: Diagnosis present

## 2020-07-12 DIAGNOSIS — R300 Dysuria: Secondary | ICD-10-CM | POA: Diagnosis not present

## 2020-07-12 DIAGNOSIS — M1990 Unspecified osteoarthritis, unspecified site: Secondary | ICD-10-CM | POA: Diagnosis present

## 2020-07-12 DIAGNOSIS — D72829 Elevated white blood cell count, unspecified: Secondary | ICD-10-CM | POA: Diagnosis present

## 2020-07-12 DIAGNOSIS — R338 Other retention of urine: Secondary | ICD-10-CM | POA: Diagnosis present

## 2020-07-12 DIAGNOSIS — R002 Palpitations: Secondary | ICD-10-CM | POA: Diagnosis not present

## 2020-07-12 DIAGNOSIS — R351 Nocturia: Secondary | ICD-10-CM | POA: Diagnosis present

## 2020-07-12 DIAGNOSIS — G4733 Obstructive sleep apnea (adult) (pediatric): Secondary | ICD-10-CM | POA: Diagnosis present

## 2020-07-12 DIAGNOSIS — Z20822 Contact with and (suspected) exposure to covid-19: Secondary | ICD-10-CM | POA: Diagnosis present

## 2020-07-12 DIAGNOSIS — Z79899 Other long term (current) drug therapy: Secondary | ICD-10-CM

## 2020-07-12 DIAGNOSIS — R040 Epistaxis: Secondary | ICD-10-CM | POA: Diagnosis not present

## 2020-07-12 DIAGNOSIS — I471 Supraventricular tachycardia: Secondary | ICD-10-CM | POA: Diagnosis present

## 2020-07-12 LAB — CBC WITH DIFFERENTIAL/PLATELET
Abs Immature Granulocytes: 0.07 10*3/uL (ref 0.00–0.07)
Basophils Absolute: 0 10*3/uL (ref 0.0–0.1)
Basophils Relative: 0 %
Eosinophils Absolute: 0.1 10*3/uL (ref 0.0–0.5)
Eosinophils Relative: 1 %
HCT: 40.9 % (ref 39.0–52.0)
Hemoglobin: 14 g/dL (ref 13.0–17.0)
Immature Granulocytes: 0 %
Lymphocytes Relative: 14 %
Lymphs Abs: 2.7 10*3/uL (ref 0.7–4.0)
MCH: 30.3 pg (ref 26.0–34.0)
MCHC: 34.2 g/dL (ref 30.0–36.0)
MCV: 88.5 fL (ref 80.0–100.0)
Monocytes Absolute: 1.9 10*3/uL — ABNORMAL HIGH (ref 0.1–1.0)
Monocytes Relative: 10 %
Neutro Abs: 14.1 10*3/uL — ABNORMAL HIGH (ref 1.7–7.7)
Neutrophils Relative %: 75 %
Platelets: 270 10*3/uL (ref 150–400)
RBC: 4.62 MIL/uL (ref 4.22–5.81)
RDW: 12.7 % (ref 11.5–15.5)
WBC: 18.8 10*3/uL — ABNORMAL HIGH (ref 4.0–10.5)
nRBC: 0 % (ref 0.0–0.2)

## 2020-07-12 LAB — COMPREHENSIVE METABOLIC PANEL
ALT: 20 U/L (ref 0–44)
AST: 15 U/L (ref 15–41)
Albumin: 4 g/dL (ref 3.5–5.0)
Alkaline Phosphatase: 47 U/L (ref 38–126)
Anion gap: 12 (ref 5–15)
BUN: 12 mg/dL (ref 6–20)
CO2: 26 mmol/L (ref 22–32)
Calcium: 9.1 mg/dL (ref 8.9–10.3)
Chloride: 98 mmol/L (ref 98–111)
Creatinine, Ser: 1.22 mg/dL (ref 0.61–1.24)
GFR calc Af Amer: >60 mL/min (ref >60)
GFR calc non Af Amer: >60 mL/min (ref >60)
Glucose, Bld: 126 mg/dL — ABNORMAL HIGH (ref 70–99)
Potassium: 3.5 mmol/L (ref 3.5–5.1)
Sodium: 136 mmol/L (ref 135–145)
Total Bilirubin: 0.9 mg/dL (ref 0.3–1.2)
Total Protein: 7.6 g/dL (ref 6.5–8.1)

## 2020-07-12 LAB — BRAIN NATRIURETIC PEPTIDE: B Natriuretic Peptide: 123.8 pg/mL — ABNORMAL HIGH (ref 0.0–100.0)

## 2020-07-12 LAB — TROPONIN I (HIGH SENSITIVITY): Troponin I (High Sensitivity): 9 ng/L (ref <18)

## 2020-07-12 MED ORDER — METOCLOPRAMIDE HCL 5 MG/ML IJ SOLN: 10.0000 mg | Freq: Once | INTRAMUSCULAR | Status: DC

## 2020-07-12 MED ORDER — METOPROLOL TARTRATE 5 MG/5ML IV SOLN
5.0000 mg | Freq: Once | INTRAVENOUS | Status: AC
  Administered 2020-07-12: 5 mg via INTRAVENOUS
  Filled 2020-07-12: qty 5

## 2020-07-12 MED ORDER — METOPROLOL TARTRATE 5 MG/5ML IV SOLN
INTRAVENOUS | Status: AC
  Filled 2020-07-12: qty 5

## 2020-07-12 MED ORDER — SODIUM CHLORIDE 0.9 % IV BOLUS
500.0000 mL | Freq: Once | INTRAVENOUS | Status: AC
  Administered 2020-07-12: 500 mL via INTRAVENOUS

## 2020-07-12 NOTE — ED Triage Notes (Addendum)
Pt arrives POV w CC of syncopal episodes and feels like HR is slowing down and speeding up. Pt brought back to room after syncopal episode in triage. Hx of a fib, needs pacemaker put in has not yet scheduled appt.

## 2020-07-12 NOTE — ED Provider Notes (Signed)
[facility] Emergency Department Provider Note ____________________________________________   First MD Initiated Contact with Patient 07/12/20 2309     (approximate)  I have reviewed the triage vital signs and the nursing notes.   HISTORY  Chief Complaint Other (rapid heart rate)    HPI John Hanna is a 59 y.o. male with PMH as noted below including atrial fibrillation who presents with dizziness, near-syncope, and syncope, multiple episodes over the last several hours started around 6:30 PM.  The episodes of lightheadedness are associated with palpitations, but no chest pain or shortness of breath.  The patient states that this feels similar to when he has had exacerbations of his atrial fibrillation over the last few months.  He states he is scheduled to have a pacemaker placed next month.  He takes metoprolol, and states that he took his normal dose this morning.  Past Medical History:  Diagnosis Date  . Arthritis   . Atrial fibrillation ([place])   . Hypertension     Patient Active Problem List   Diagnosis Date Noted  . Paroxysmal atrial fibrillation ([place]) 02/26/2020  . OSA on CPAP 06/20/2019  . BPH associated with nocturia 02/21/2018  . Essential (primary) hypertension 07/26/2015  . History of colon polyps 07/26/2015  . H/O malignant neoplasm of skin 07/26/2015  . Plantar fasciitis 07/26/2015    Past Surgical History:  Procedure Laterality Date  . COLONOSCOPY  01/2011   repeat 3 yrs - @ [facility] internal medicine  . HERNIA REPAIR  1992    Prior to Admission medications   Medication Sig Start Date End Date Taking? Authorizing Provider  fluticasone (FLONASE) 50 MCG/ACT nasal spray Place 1 spray into both nostrils daily.    Yes [provider]  metoprolol succinate (TOPROL-XL) 50 MG 24 hr tablet Take 75 mg by mouth daily.  05/29/19  Yes [provider]  NON FORMULARY CPAP Nightly.   Yes [provider]    potassium chloride (KLOR-CON) 10 MEQ tablet Take 10 mEq by mouth daily. 06/22/20  Yes [provider]  tamsulosin (FLOMAX) 0.4 MG CAPS capsule Take 1 capsule (0.4 mg total) by mouth daily. Patient taking differently: Take 0.4 mg by mouth daily at 6 PM.  07/07/20  Yes [name], MD  valsartan-hydrochlorothiazide (DIOVAN-HCT) 320-25 MG tablet Take 1 tablet by mouth once daily Patient taking differently: Take 1 tablet by mouth daily.  08/24/19  Yes [name], MD  aspirin EC 81 MG tablet Take 81 mg by mouth daily.  Patient not taking: Reported on 07/12/2020    [provider]    Allergies Cyclobenzaprine  Family History  Problem Relation Age of Onset  . Colon cancer Mother        died age 78  . Hypertension Father   . Prostate cancer Father 78    Social History Social History   Tobacco Use  . Smoking status: Former Smoker    Packs/day: 0.50    Years: 5.00    Pack years: 2.50    Types: Cigarettes    Quit date: 10/17/1987    Years since quitting: 32.7  . Smokeless tobacco: Never Used  Vaping Use  . Vaping Use: Never used  Substance Use Topics  . Alcohol use: No    Alcohol/week: 0.0 standard drinks  . Drug use: Never    Review of Systems  Constitutional: No fever/chills. Eyes: No visual changes. ENT: No sore throat. Cardiovascular: Denies chest pain.  Positive for palpitations. Respiratory: Denies  shortness of breath. Gastrointestinal: No vomiting or diarrhea.  Genitourinary: Negative for dysuria.  Musculoskeletal: Negative for back pain. Skin: Negative for rash. Neurological: Negative for headaches, focal weakness or numbness.   ____________________________________________   PHYSICAL EXAM:  VITAL SIGNS: ED Triage Vitals  Enc Vitals Group     BP 07/12/20 2315 115/87     Pulse Rate 07/12/20 2309 (!) 46     Resp 07/12/20 2309 18     Temp --      Temp src --      SpO2 07/12/20 2309 100 %     Weight 07/12/20 2313 223 lb 12.8 oz  (101.5 kg)     Height 07/12/20 2313 6\' 2"  (1.88 m)     Head Circumference --      Peak Flow --      Pain Score 07/12/20 2313 0     Pain Loc --      Pain Edu? --      Excl. in [place]? --     Constitutional: Alert and oriented.  Slightly weak appearing but in no acute distress. Eyes: Conjunctivae are normal.  Head: Atraumatic. Nose: No congestion/rhinnorhea. Mouth/Throat: Mucous membranes are moist.   Neck: Normal range of motion.  Cardiovascular: Irregular rhythm, intermittently tachycardic. Grossly normal heart sounds.  Good peripheral circulation. Respiratory: Normal respiratory effort.  No retractions. Lungs CTAB. Gastrointestinal: No distention.  Musculoskeletal: No lower extremity edema.  Extremities warm and well perfused.  Neurologic:  Normal speech and language. No gross focal neurologic deficits are appreciated.  Skin:  Skin is warm and dry. No rash noted. Psychiatric: Mood and affect are normal. Speech and behavior are normal.  ____________________________________________   LABS (all labs ordered are listed, but only abnormal results are displayed)  Labs Reviewed  COMPREHENSIVE METABOLIC PANEL - Abnormal; Notable for the following components:      Result Value   Glucose, Bld 126 (*)    All other components within normal limits  CBC WITH DIFFERENTIAL/PLATELET - Abnormal; Notable for the following components:   WBC 18.8 (*)    Neutro Abs 14.1 (*)    Monocytes Absolute 1.9 (*)    All other components within normal limits  BRAIN NATRIURETIC PEPTIDE - Abnormal; Notable for the following components:   B Natriuretic Peptide 123.8 (*)    All other components within normal limits  RESPIRATORY PANEL BY RT PCR (FLU A&B, COVID)  URINALYSIS, COMPLETE (UACMP) WITH MICROSCOPIC  MAGNESIUM  TROPONIN I (HIGH SENSITIVITY)  TROPONIN I (HIGH SENSITIVITY)   ____________________________________________  EKG  ED ECG REPORT I, Arta Silence, the attending physician, personally  viewed and interpreted this ECG.  Date: 07/13/2020 EKG Time: 2311 Rate: 132 Rhythm: Atrial fibrillation with PVCs QRS Axis: normal Intervals: normal ST/T Wave abnormalities: Nonspecific abnormalities Narrative Interpretation: Atrial fibrillation with no evidence of acute ischemia   ED ECG REPORT I, Arta Silence, the attending physician, personally viewed and interpreted this ECG.  Date: 07/13/2020 EKG Time: 2312 Rate: 178 Rhythm: Atrial fibrillation with RVR QRS Axis: normal Intervals: normal ST/T Wave abnormalities: Nonspecific abnormalities Narrative Interpretation: Atrial fibrillation with RVR; no evidence of acute ischemia  ____________________________________________  RADIOLOGY  CXR interpreted by me shows no focal infiltrate or edema  ____________________________________________   PROCEDURES  Procedure(s) performed: No  Procedures  Critical Care performed: Yes  CRITICAL CARE Performed by: Arta Silence   Total critical care time: 35 minutes  Critical care time was exclusive of separately billable procedures and treating other patients.  Critical care was  necessary to treat or prevent imminent or life-threatening deterioration.  Critical care was time spent personally by me on the following activities: development of treatment plan with patient and/or surrogate as well as nursing, discussions with consultants, evaluation of patient's response to treatment, examination of patient, obtaining history from patient or surrogate, ordering and performing treatments and interventions, ordering and review of laboratory studies, ordering and review of radiographic studies, pulse oximetry and re-evaluation of patient's condition. ____________________________________________   INITIAL IMPRESSION / ASSESSMENT AND PLAN / ED COURSE  Pertinent labs & imaging results that were available during my care of the patient were reviewed by me and considered in my  medical decision making (see chart for details).  59 year old male with PMH as noted above presents with multiple episodes of lightheadedness and near syncope since 6:30 PM, with 2 episodes of syncope while in the car on the way here.  These are associated with palpitations.  I reviewed the past medical records in [place]. The patient was seen here on 9/4 with similar symptoms that resolved after several hours in the ED waiting room, so he left before being seen.  He follows with Dr. Ubaldo Glassing from cardiology, and was last seen on 9/7.  He has a history of paroxysmal atrial fibrillation with episodes of bradycardia as low as the 30s and 40s.  He is being treated with metoprolol, but is planned for a dual-chamber pacemaker.  He was also recently somewhat hypokalemic.  On exam, the patient is overall relatively well-appearing although becomes somewhat pale and uncomfortable appearing during the episodes of tachycardia.  His vital signs are normal except for his heart rate; the rhythm is regular, and the rate will go up to as high as 205 for 15 to 30 seconds and then go back down to the 90s.  Lungs are clear and he has no peripheral edema.  Exam is otherwise as described above.  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  Given that there is no sustained severe tachycardia, and the patient's blood pressure and mental status are stable, there is no indication for urgent cardioversion.  We will keep the patient on the monitor, obtain labs, chest x-ray, and I will consult cardiology to discuss further management.  ----------------------------------------- 12:04 AM on 07/13/2020 -----------------------------------------  I discussed the patient's case with Dr. Nehemiah Massed from cardiology.  He recommends giving IV metoprolol and relatively small doses to keep the patient's rate under control.  He agrees that there is no indication for  cardioversion at this time, and agrees with admission for further management.  ----------------------------------------- 1:58 AM on 07/13/2020 -----------------------------------------  The patient has been doing well after 5 metoprolol IV, with a heart rate in the 90s to 120s.  We will continue to monitor.  I discussed the case with Dr. Sidney Ace from the hospitalist service for admission.  ____________________________________________   FINAL CLINICAL IMPRESSION(S) / ED DIAGNOSES  Final diagnoses:  None      NEW MEDICATIONS STARTED DURING THIS VISIT:  New Prescriptions   No medications on file     Note:  This document was prepared using Dragon voice recognition software and may include unintentional dictation errors.   Arta Silence, MD 07/13/20 [phone]

## 2020-07-13 ENCOUNTER — Encounter: Payer: Self-pay | Admitting: Family Medicine

## 2020-07-13 DIAGNOSIS — I480 Paroxysmal atrial fibrillation: Principal | ICD-10-CM

## 2020-07-13 DIAGNOSIS — G4733 Obstructive sleep apnea (adult) (pediatric): Secondary | ICD-10-CM

## 2020-07-13 DIAGNOSIS — R351 Nocturia: Secondary | ICD-10-CM | POA: Diagnosis present

## 2020-07-13 DIAGNOSIS — Z87891 Personal history of nicotine dependence: Secondary | ICD-10-CM | POA: Diagnosis not present

## 2020-07-13 DIAGNOSIS — Z9989 Dependence on other enabling machines and devices: Secondary | ICD-10-CM

## 2020-07-13 DIAGNOSIS — I10 Essential (primary) hypertension: Secondary | ICD-10-CM

## 2020-07-13 DIAGNOSIS — I495 Sick sinus syndrome: Secondary | ICD-10-CM | POA: Diagnosis present

## 2020-07-13 DIAGNOSIS — N401 Enlarged prostate with lower urinary tract symptoms: Secondary | ICD-10-CM | POA: Diagnosis present

## 2020-07-13 DIAGNOSIS — I2510 Atherosclerotic heart disease of native coronary artery without angina pectoris: Secondary | ICD-10-CM | POA: Diagnosis present

## 2020-07-13 DIAGNOSIS — I471 Supraventricular tachycardia: Secondary | ICD-10-CM | POA: Diagnosis not present

## 2020-07-13 DIAGNOSIS — R338 Other retention of urine: Secondary | ICD-10-CM

## 2020-07-13 DIAGNOSIS — R002 Palpitations: Secondary | ICD-10-CM | POA: Diagnosis present

## 2020-07-13 DIAGNOSIS — Z888 Allergy status to other drugs, medicaments and biological substances status: Secondary | ICD-10-CM | POA: Diagnosis not present

## 2020-07-13 DIAGNOSIS — Z79899 Other long term (current) drug therapy: Secondary | ICD-10-CM | POA: Diagnosis not present

## 2020-07-13 DIAGNOSIS — M1990 Unspecified osteoarthritis, unspecified site: Secondary | ICD-10-CM | POA: Diagnosis present

## 2020-07-13 DIAGNOSIS — R040 Epistaxis: Secondary | ICD-10-CM | POA: Diagnosis not present

## 2020-07-13 DIAGNOSIS — Z20822 Contact with and (suspected) exposure to covid-19: Secondary | ICD-10-CM | POA: Diagnosis present

## 2020-07-13 DIAGNOSIS — D72829 Elevated white blood cell count, unspecified: Secondary | ICD-10-CM | POA: Diagnosis present

## 2020-07-13 DIAGNOSIS — Z8249 Family history of ischemic heart disease and other diseases of the circulatory system: Secondary | ICD-10-CM | POA: Diagnosis not present

## 2020-07-13 DIAGNOSIS — R300 Dysuria: Secondary | ICD-10-CM | POA: Diagnosis not present

## 2020-07-13 DIAGNOSIS — I472 Ventricular tachycardia: Secondary | ICD-10-CM | POA: Diagnosis present

## 2020-07-13 HISTORY — DX: Other retention of urine: R33.8

## 2020-07-13 LAB — BASIC METABOLIC PANEL
Anion gap: 9 (ref 5–15)
BUN: 11 mg/dL (ref 6–20)
CO2: 25 mmol/L (ref 22–32)
Calcium: 8.8 mg/dL — ABNORMAL LOW (ref 8.9–10.3)
Chloride: 101 mmol/L (ref 98–111)
Creatinine, Ser: 1.22 mg/dL (ref 0.61–1.24)
GFR calc Af Amer: >60 mL/min (ref >60)
GFR calc non Af Amer: >60 mL/min (ref >60)
Glucose, Bld: 145 mg/dL — ABNORMAL HIGH (ref 70–99)
Potassium: 3.6 mmol/L (ref 3.5–5.1)
Sodium: 135 mmol/L (ref 135–145)

## 2020-07-13 LAB — URINALYSIS, COMPLETE (UACMP) WITH MICROSCOPIC
Bacteria, UA: NONE SEEN
Bilirubin Urine: NEGATIVE
Glucose, UA: NEGATIVE mg/dL
Hgb urine dipstick: NEGATIVE
Ketones, ur: NEGATIVE mg/dL
Nitrite: POSITIVE — AB
Protein, ur: NEGATIVE mg/dL
Specific Gravity, Urine: 1.01 (ref 1.005–1.030)
Squamous Epithelial / HPF: NONE SEEN (ref 0–5)
pH: 7 (ref 5.0–8.0)

## 2020-07-13 LAB — TROPONIN I (HIGH SENSITIVITY): Troponin I (High Sensitivity): 8 ng/L (ref <18)

## 2020-07-13 LAB — CBC
HCT: 38.1 % — ABNORMAL LOW (ref 39.0–52.0)
Hemoglobin: 12.7 g/dL — ABNORMAL LOW (ref 13.0–17.0)
MCH: 30 pg (ref 26.0–34.0)
MCHC: 33.3 g/dL (ref 30.0–36.0)
MCV: 90.1 fL (ref 80.0–100.0)
Platelets: 236 10*3/uL (ref 150–400)
RBC: 4.23 MIL/uL (ref 4.22–5.81)
RDW: 12.5 % (ref 11.5–15.5)
WBC: 16.1 10*3/uL — ABNORMAL HIGH (ref 4.0–10.5)
nRBC: 0 % (ref 0.0–0.2)

## 2020-07-13 LAB — MAGNESIUM: Magnesium: 2 mg/dL (ref 1.7–2.4)

## 2020-07-13 LAB — LIPID PANEL
Cholesterol: 134 mg/dL (ref 0–200)
HDL: 48 mg/dL (ref >40)
LDL Cholesterol: 74 mg/dL (ref 0–99)
Total CHOL/HDL Ratio: 2.8 RATIO
Triglycerides: 61 mg/dL (ref <150)
VLDL: 12 mg/dL (ref 0–40)

## 2020-07-13 LAB — RESPIRATORY PANEL BY RT PCR (FLU A&B, COVID)
Influenza A by PCR: NEGATIVE
Influenza B by PCR: NEGATIVE
SARS Coronavirus 2 by RT PCR: NEGATIVE

## 2020-07-13 LAB — HIV ANTIBODY (ROUTINE TESTING W REFLEX): HIV Screen 4th Generation wRfx: NONREACTIVE

## 2020-07-13 LAB — TSH: TSH: 1.655 u[IU]/mL (ref 0.350–4.500)

## 2020-07-13 LAB — T4, FREE: Free T4: 1.14 ng/dL — ABNORMAL HIGH (ref 0.61–1.12)

## 2020-07-13 MED ORDER — POTASSIUM CHLORIDE 20 MEQ PO PACK
40.0000 meq | PACK | Freq: Once | ORAL | Status: AC
  Administered 2020-07-13: 40 meq via ORAL
  Filled 2020-07-13: qty 2

## 2020-07-13 MED ORDER — ONDANSETRON HCL 4 MG/2ML IJ SOLN
4.0000 mg | Freq: Four times a day (QID) | INTRAMUSCULAR | Status: DC | PRN
  Administered 2020-07-13: 4 mg via INTRAVENOUS
  Filled 2020-07-13: qty 2

## 2020-07-13 MED ORDER — AMIODARONE HCL IN DEXTROSE 360-4.14 MG/200ML-% IV SOLN
60.0000 mg/h | INTRAVENOUS | Status: AC
  Administered 2020-07-13: 60 mg/h via INTRAVENOUS
  Filled 2020-07-13: qty 200

## 2020-07-13 MED ORDER — ACETAMINOPHEN 325 MG PO TABS
650.0000 mg | ORAL_TABLET | ORAL | Status: DC | PRN
  Administered 2020-07-14 – 2020-07-16 (×3): 650 mg via ORAL
  Filled 2020-07-13 (×2): qty 2

## 2020-07-13 MED ORDER — FLUCONAZOLE IN SODIUM CHLORIDE 200-0.9 MG/100ML-% IV SOLN
200.0000 mg | INTRAVENOUS | Status: DC
  Filled 2020-07-13: qty 100

## 2020-07-13 MED ORDER — AMIODARONE HCL IN DEXTROSE 360-4.14 MG/200ML-% IV SOLN
30.0000 mg/h | INTRAVENOUS | Status: DC
  Administered 2020-07-13 – 2020-07-14 (×2): 30 mg/h via INTRAVENOUS
  Filled 2020-07-13 (×3): qty 200

## 2020-07-13 MED ORDER — ENOXAPARIN SODIUM 40 MG/0.4ML ~~LOC~~ SOLN
40.0000 mg | SUBCUTANEOUS | Status: DC
  Administered 2020-07-13 – 2020-07-16 (×4): 40 mg via SUBCUTANEOUS
  Filled 2020-07-13 (×4): qty 0.4

## 2020-07-13 MED ORDER — FLUTICASONE PROPIONATE 50 MCG/ACT NA SUSP
1.0000 | Freq: Every day | NASAL | Status: DC
  Filled 2020-07-13 (×2): qty 16

## 2020-07-13 MED ORDER — METOPROLOL SUCCINATE ER 50 MG PO TB24
75.0000 mg | ORAL_TABLET | Freq: Every day | ORAL | Status: DC
  Filled 2020-07-13: qty 2

## 2020-07-13 MED ORDER — IRBESARTAN 150 MG PO TABS
300.0000 mg | ORAL_TABLET | Freq: Every day | ORAL | Status: DC
  Administered 2020-07-15 – 2020-07-16 (×2): 300 mg via ORAL
  Filled 2020-07-13 (×5): qty 2

## 2020-07-13 MED ORDER — AMIODARONE IV BOLUS ONLY 150 MG/100ML
150.0000 mg | Freq: Once | INTRAVENOUS | Status: AC
  Administered 2020-07-13: 150 mg via INTRAVENOUS
  Filled 2020-07-13: qty 100

## 2020-07-13 MED ORDER — ALPRAZOLAM 0.25 MG PO TABS: 0.2500 mg | ORAL_TABLET | Freq: Two times a day (BID) | ORAL | Status: DC | PRN

## 2020-07-13 MED ORDER — POTASSIUM CHLORIDE CRYS ER 10 MEQ PO TBCR
10.0000 meq | EXTENDED_RELEASE_TABLET | Freq: Every day | ORAL | Status: DC
  Administered 2020-07-14 – 2020-07-16 (×3): 10 meq via ORAL
  Filled 2020-07-13 (×6): qty 1

## 2020-07-13 MED ORDER — HYDROCHLOROTHIAZIDE 25 MG PO TABS
25.0000 mg | ORAL_TABLET | Freq: Every day | ORAL | Status: DC
  Administered 2020-07-14: 25 mg via ORAL
  Filled 2020-07-13 (×4): qty 1

## 2020-07-13 MED ORDER — ZOLPIDEM TARTRATE 5 MG PO TABS: 5.0000 mg | ORAL_TABLET | Freq: Every evening | ORAL | Status: DC | PRN

## 2020-07-13 MED ORDER — SODIUM CHLORIDE 0.9 % IV SOLN
1.0000 g | Freq: Every day | INTRAVENOUS | Status: DC
  Administered 2020-07-13 – 2020-07-16 (×4): 1 g via INTRAVENOUS
  Filled 2020-07-13 (×2): qty 1
  Filled 2020-07-13 (×2): qty 10
  Filled 2020-07-13: qty 1

## 2020-07-13 MED ORDER — VALSARTAN-HYDROCHLOROTHIAZIDE 320-25 MG PO TABS: 1.0000 | ORAL_TABLET | Freq: Every day | ORAL | Status: DC

## 2020-07-13 MED ORDER — TAMSULOSIN HCL 0.4 MG PO CAPS
0.4000 mg | ORAL_CAPSULE | Freq: Every day | ORAL | Status: DC
  Administered 2020-07-13 – 2020-07-16 (×5): 0.4 mg via ORAL
  Filled 2020-07-13 (×4): qty 1

## 2020-07-13 MED ORDER — DIGOXIN 0.25 MG/ML IJ SOLN
0.2500 mg | Freq: Once | INTRAMUSCULAR | Status: AC
  Administered 2020-07-13: 0.25 mg via INTRAVENOUS
  Filled 2020-07-13: qty 2

## 2020-07-13 NOTE — ED Notes (Signed)
Pt assisted to the bathroom at this time.

## 2020-07-13 NOTE — ED Notes (Signed)
Pt placed in hospital bed at this time

## 2020-07-13 NOTE — Consult Note (Signed)
Urology Consult  I have been asked to see the patient by Dr. Arbutus Ped, for evaluation and management of acute urinary retention.  Chief Complaint: Syncope, presyncope, palpitations  History of Present Illness: John Hanna is a 59 y.o. year old male with PMH paroxysmal A fib, OSA on CPAP, and BPH with urinary retention who presented to the ED overnight with reports of syncopal episodes and palpitations. He subsequently reported abdominal pain with bladder scan 661mL; foley catheter placed.  Admission labs notable for UA with nitrites, 11-20 WBCs/hpf, and budding yeast; leukocytosis at 18.8; and creatinine 1.22.  Patient reports having missed his dose of Flomax yesterday due to his syncopal episodes, however he states he felt he was urinating well prior to this. He reports some mild dysuria since his recent voiding trial that acutely worsened this morning after presenting to the ED.   Foley catheter in place today draining clear, yellow urine.  Prior to today, he has a history of at least 3 other episodes of acute urinary retention associated with pharmacotherapy, most recently on 06/27/2020. He was seen in our clinic 6 days ago by Dr. Erlene Quan for ED follow-up of this most recent episode. He passed a voiding trial that day and was started on daily Flomax with plans for follow-up on 08/04/2020 for IPSS, PVR, DRE, PSA, and to discuss possible UroLift.  Past Medical History:  Diagnosis Date   Arthritis    Atrial fibrillation ([place])    Hypertension     Past Surgical History:  Procedure Laterality Date   COLONOSCOPY  01/2011   repeat 3 yrs - [facility]   HERNIA REPAIR  1992    Home Medications:  Current Meds  Medication Sig   fluticasone (FLONASE) 50 MCG/ACT nasal spray Place 1 spray into both nostrils daily.    metoprolol succinate (TOPROL-XL) 50 MG 24 hr tablet Take 75 mg by mouth daily.    NON FORMULARY CPAP Nightly.   potassium chloride  (KLOR-CON) 10 MEQ tablet Take 10 mEq by mouth daily.   tamsulosin (FLOMAX) 0.4 MG CAPS capsule Take 1 capsule (0.4 mg total) by mouth daily. (Patient taking differently: Take 0.4 mg by mouth daily at 6 PM. )   valsartan-hydrochlorothiazide (DIOVAN-HCT) 320-25 MG tablet Take 1 tablet by mouth once daily (Patient taking differently: Take 1 tablet by mouth daily. )    Allergies:  Allergies  Allergen Reactions   Cyclobenzaprine Other (See Comments)    Acute urinary retention    Family History  Problem Relation Age of Onset   Colon cancer Mother        died age 94   Hypertension Father    Prostate cancer Father 34    Social History:  reports that he quit smoking about 32 years ago. His smoking use included cigarettes. He has a 2.50 pack-year smoking history. He has never used smokeless tobacco. He reports that he does not drink alcohol and does not use drugs.  ROS: A complete review of systems was performed.  All systems are negative except for pertinent findings as noted.  Physical Exam:  Vital signs in last 24 hours: Temp:  [97.8 F (36.6 C)] 97.8 F (36.6 C) (09/28 1630) Pulse Rate:  [30-124] 65 (09/28 1630) Resp:  [11-29] 15 (09/28 1630) BP: (94-133)/(56-87) 115/62 (09/28 1630) SpO2:  [90 %-100 %] 98 % (09/28 1630) Weight:  [101.5 kg] 101.5 kg (09/27 2313) Constitutional:  Alert and oriented, no acute distress HEENT: [place] AT, moist mucus  membranes.  Trachea midline, no masses Cardiovascular: Regular rate and rhythm, no clubbing, cyanosis, or edema. Respiratory: Normal respiratory effort, lungs clear bilaterally Skin: No rashes, bruises or suspicious lesions Neurologic: Grossly intact, no focal deficits, moving all 4 extremities Psychiatric: Normal mood and affect  Laboratory Data:  Recent Labs    07/12/20 2316 07/13/20 0341  WBC 18.8* 16.1*  HGB 14.0 12.7*  HCT 40.9 38.1*   Recent Labs    07/12/20 2316 07/13/20 0341  NA 136 135  K 3.5 3.6  CL 98 101  CO2  26 25  GLUCOSE 126* 145*  BUN 12 11  CREATININE 1.22 1.22  CALCIUM 9.1 8.8*   Urinalysis    Component Value Date/Time   COLORURINE YELLOW (A) 07/12/2020 2316   APPEARANCEUR HAZY (A) 07/12/2020 2316   LABSPEC 1.010 07/12/2020 2316   PHURINE 7.0 07/12/2020 2316   GLUCOSEU NEGATIVE 07/12/2020 2316   HGBUR NEGATIVE 07/12/2020 2316   BILIRUBINUR NEGATIVE 07/12/2020 2316   BILIRUBINUR neg 02/26/2020 1231   [place] 07/12/2020 2316   PROTEINUR NEGATIVE 07/12/2020 2316   UROBILINOGEN 0.2 02/26/2020 1231   NITRITE POSITIVE (A) 07/12/2020 2316   LEUKOCYTESUR SMALL (A) 07/12/2020 2316   Results for orders placed or performed during the hospital encounter of 07/12/20  Respiratory Panel by RT PCR (Flu A&B, Covid) - Nasopharyngeal Swab     Status: None   Collection Time: 07/13/20  2:26 AM   Specimen: Nasopharyngeal Swab  Result Value Ref Range Status   SARS Coronavirus 2 by RT PCR NEGATIVE NEGATIVE Final    Comment: (NOTE) SARS-CoV-2 target nucleic acids are NOT DETECTED.  The SARS-CoV-2 RNA is generally detectable in upper respiratoy specimens during the acute phase of infection. The lowest concentration of SARS-CoV-2 viral copies this assay can detect is 131 copies/mL. A negative result does not preclude SARS-Cov-2 infection and should not be used as the sole basis for treatment or other patient management decisions. A negative result may occur with  improper specimen collection/handling, submission of specimen other than nasopharyngeal swab, presence of viral mutation(s) within the areas targeted by this assay, and inadequate number of viral copies (<131 copies/mL). A negative result must be combined with clinical observations, patient history, and epidemiological information. The expected result is Negative.  Fact Sheet for Patients:  PinkCheek.be  Fact Sheet for Healthcare Providers:  GravelBags.it  This test  is no t yet approved or cleared by the [place] FDA and  has been authorized for detection and/or diagnosis of SARS-CoV-2 by FDA under an Emergency Use Authorization (EUA). This EUA will remain  in effect (meaning this test can be used) for the duration of the COVID-19 declaration under Section 564(b)(1) of the Act, 21 U.S.C. section 360bbb-3(b)(1), unless the authorization is terminated or revoked sooner.     Influenza A by PCR NEGATIVE NEGATIVE Final   Influenza B by PCR NEGATIVE NEGATIVE Final    Comment: (NOTE) The Xpert Xpress SARS-CoV-2/FLU/RSV assay is intended as an aid in  the diagnosis of influenza from Nasopharyngeal swab specimens and  should not be used as a sole basis for treatment. Nasal washings and  aspirates are unacceptable for Xpert Xpress SARS-CoV-2/FLU/RSV  testing.  Fact Sheet for Patients: PinkCheek.be  Fact Sheet for Healthcare Providers: GravelBags.it  This test is not yet approved or cleared by the [place] FDA and  has been authorized for detection and/or diagnosis of SARS-CoV-2 by  FDA under an Emergency Use Authorization (EUA). This EUA will remain  in  effect (meaning this test can be used) for the duration of the  Covid-19 declaration under Section 564(b)(1) of the Act, 21  U.S.C. section 360bbb-3(b)(1), unless the authorization is  terminated or revoked. Performed at [facility], [address] [place], [place] [address]    Assessment & Plan:  59 year old male with PMH paroxysmal A. fib BPH with urinary retention who presented to the ED with reports of multiple presyncopal and syncopal episodes and palpitations and who subsequently went into urinary retention after presentation in the setting of a missed dose of Flomax.  Admission UA infected and notable for both nitrites and budding yeast.  Recommend urine culture and empiric antibiotics and Diflucan for management of  UTI.  Continue Foley catheter and daily Flomax.  Recommend outpatient voiding trial in 7 to 10 days with plans to keep already-scheduled outpatient follow-up with Dr. Erlene Quan in late October.  Recommendations: -Add on urine culture -Start empiric antibiotics and Diflucan -Continue Flomax -Continue Foley with plans for outpatient voiding trial in 7 to 10 days  Thank you for involving me in this patient's care, please page with any further questions or concerns.  Debroah Loop, PA-C 07/13/2020 5:13 PM

## 2020-07-13 NOTE — ED Notes (Signed)
Pt assisted to the bathroom.

## 2020-07-13 NOTE — Consult Note (Signed)
[facility] Consultation Note  Patient ID: Annie Roseboom, MRN: [id number], DOB/AGE: 57-Sep-1962 57 y.o. Admit date: 07/12/2020   Date of Consult: 07/13/2020 Primary Physician: [name], MD Primary Cardiologist: Ubaldo Glassing   Chief Complaint:  Chief Complaint  Patient presents with  . Other    rapid heart rate   Reason for Consult: HPI    Other     Additional comments: rapid heart rate       Last edited by Georgette Shell, RN on 07/12/2020 11:08 PM. (History)       HPI: 59 y.o. male with known paroxysmal nonvalvular atrial fibrillation with apparent sick sinus syndrome and recurrent episodes of syncope. The patient has been on appropriate medication management for atrial fibrillation and has had concerns of bradycardia with syncope. The syncopal episodes also appear to have been occurring with very rapid atrial fibrillation. When seen with Holter monitor and further evaluation as an outpatient it appears that the patient would be needing pacemaker placement for pacemaker backup with medication management for atrial fibrillation with rapid ventricular rate. He is currently scheduled for that but has had more episodes of paroxysmal atrial fibrillation with a heart rate of 200 bpm. As advised the patient was given amiodarone for better control of atrial fibrillation and currently stable with no evidence of sick sinus syndrome but continues to be in normal sinus rhythm. The patient is hemodynamically stable with no evidence of current concerns of syncope. There is been no evidence of also concerns of heart failure or angina. Currently he is hemodynamically stable  Past Medical History:  Diagnosis Date  . Arthritis   . Atrial fibrillation ([place])   . Hypertension       Surgical History:  Past Surgical History:  Procedure Laterality Date  . COLONOSCOPY  01/2011   repeat 3 yrs - @ [facility] internal medicine  . HERNIA REPAIR  1992     Home Meds: Prior to Admission  medications   Medication Sig Start Date End Date Taking? Authorizing Provider  fluticasone (FLONASE) 50 MCG/ACT nasal spray Place 1 spray into both nostrils daily.    Yes [provider]  metoprolol succinate (TOPROL-XL) 50 MG 24 hr tablet Take 75 mg by mouth daily.  05/29/19  Yes [provider]  NON FORMULARY CPAP Nightly.   Yes [provider]  potassium chloride (KLOR-CON) 10 MEQ tablet Take 10 mEq by mouth daily. 06/22/20  Yes [provider]  tamsulosin (FLOMAX) 0.4 MG CAPS capsule Take 1 capsule (0.4 mg total) by mouth daily. Patient taking differently: Take 0.4 mg by mouth daily at 6 PM.  07/07/20  Yes [name], MD  valsartan-hydrochlorothiazide (DIOVAN-HCT) 320-25 MG tablet Take 1 tablet by mouth once daily Patient taking differently: Take 1 tablet by mouth daily.  08/24/19  Yes [name], MD  aspirin EC 81 MG tablet Take 81 mg by mouth daily.  Patient not taking: Reported on 07/12/2020    [provider]    Inpatient Medications:  . enoxaparin (LOVENOX) injection  40 mg Subcutaneous Q24H  . fluticasone  1 spray Each Nare Daily  . irbesartan  300 mg Oral Daily   Or  . hydrochlorothiazide  25 mg Oral Daily  . metoprolol succinate  75 mg Oral Daily  . potassium chloride  10 mEq Oral Daily  . tamsulosin  0.4 mg Oral q1800   . amiodarone 30 mg/hr (07/13/20 1016)    Allergies:  Allergies  Allergen Reactions  .  Cyclobenzaprine Other (See Comments)    Acute urinary retention    Social History   Socioeconomic History  . Marital status: Married    Spouse name: Not on file  . Number of children: Not on file  . Years of education: Not on file  . Highest education level: Not on file  Occupational History  . Occupation: retired  Tobacco Use  . Smoking status: Former Smoker    Packs/day: 0.50    Years: 5.00    Pack years: 2.50    Types: Cigarettes    Quit date: 10/17/1987    Years since quitting: 32.7  . Smokeless  tobacco: Never Used  Vaping Use  . Vaping Use: Never used  Substance and Sexual Activity  . Alcohol use: No    Alcohol/week: 0.0 standard drinks  . Drug use: Never  . Sexual activity: Not Currently    Birth control/protection: None  Other Topics Concern  . Not on file  Social History Narrative  . Not on file   Social Determinants of Health   Financial Resource Strain:   . Difficulty of Paying Living Expenses: Not on file  Food Insecurity:   . Worried About Charity fundraiser in the Last Year: Not on file  . Ran Out of Food in the Last Year: Not on file  Transportation Needs:   . Lack of Transportation (Medical): Not on file  . Lack of Transportation (Non-Medical): Not on file  Physical Activity:   . Days of Exercise per Week: Not on file  . Minutes of Exercise per Session: Not on file  Stress:   . Feeling of Stress : Not on file  Social Connections:   . Frequency of Communication with Friends and Family: Not on file  . Frequency of Social Gatherings with Friends and Family: Not on file  . Attends Religious Services: Not on file  . Active Member of Clubs or Organizations: Not on file  . Attends Archivist Meetings: Not on file  . Marital Status: Not on file  Intimate Partner Violence:   . Fear of Current or Ex-Partner: Not on file  . Emotionally Abused: Not on file  . Physically Abused: Not on file  . Sexually Abused: Not on file     Family History  Problem Relation Age of Onset  . Colon cancer Mother        died age 17  . Hypertension Father   . Prostate cancer Father 87     Review of Systems Positive for syncope Negative for: General:  chills, fever, night sweats or weight changes.  Cardiovascular: PND orthopnea positive for syncope dizziness  Dermatological skin lesions rashes Respiratory: Cough congestion Urologic: Frequent urination urination at night and hematuria Abdominal: negative for nausea, vomiting, diarrhea, bright red blood per  rectum, melena, or hematemesis Neurologic: negative for visual changes, and/or hearing changes  All other systems reviewed and are otherwise negative except as noted above.  Labs: No results for input(s): CKTOTAL, CKMB, TROPONINI in the last 72 hours. Lab Results  Component Value Date   WBC 16.1 (H) 07/13/2020   HGB 12.7 (L) 07/13/2020   HCT 38.1 (L) 07/13/2020   MCV 90.1 07/13/2020   PLT 236 07/13/2020    Recent Labs  Lab 07/12/20 2316 07/12/20 2316 07/13/20 0341  NA 136   < > 135  K 3.5   < > 3.6  CL 98   < > 101  CO2 26   < > 25  BUN 12   < > 11  CREATININE 1.22   < > 1.22  CALCIUM 9.1   < > 8.8*  PROT 7.6  --   --   BILITOT 0.9  --   --   ALKPHOS 47  --   --   ALT 20  --   --   AST 15  --   --   GLUCOSE 126*   < > 145*   < > = values in this interval not displayed.   Lab Results  Component Value Date   CHOL 134 07/13/2020   HDL 48 07/13/2020   LDLCALC 74 07/13/2020   TRIG 61 07/13/2020   No results found for: DDIMER  Radiology/Studies:  DG Chest 2 View  Result Date: 06/19/2020 CLINICAL DATA:  Chest pain.  History of atrial fibrillation. EXAM: CHEST - 2 VIEW COMPARISON:  09/28/2019 FINDINGS: Midline trachea. Normal heart size. No pleural effusion or pneumothorax. Subsegmental atelectasis or interval scarring in the left lung base laterally. No lobar consolidation. Mild biapical pleuroparenchymal scarring. IMPRESSION: No acute cardiopulmonary disease. Electronically Signed   By: Abigail Miyamoto M.D.   On: 06/19/2020 16:15   DG Chest Portable 1 View  Result Date: 07/12/2020 CLINICAL DATA:  Atrial fibrillation EXAM: PORTABLE CHEST 1 VIEW COMPARISON:  06/19/2020 FINDINGS: The heart size and mediastinal contours are within normal limits. Both lungs are clear. The visualized skeletal structures are unremarkable. IMPRESSION: No active disease. Electronically Signed   By: Donavan Foil M.D.   On: 07/12/2020 23:31    EKG: Normal sinus rhythm otherwise normal  EKG  Weights: Filed Weights   07/12/20 2313  Weight: 101.5 kg     Physical Exam: Blood pressure 115/65, pulse 66, resp. rate 15, height 6\' 2"  (1.88 m), weight 101.5 kg, SpO2 95 %. Body mass index is 28.73 kg/m. General: Well developed, well nourished, in no acute distress. Head eyes ears nose throat: Normocephalic, atraumatic, sclera non-icteric, no xanthomas, nares are without discharge. No apparent thyromegaly and/or mass  Lungs: Normal respiratory effort.  no wheezes, no rales, no rhonchi.  Heart: RRR with normal S1 S2. no murmur gallop, no rub, PMI is normal size and placement, carotid upstroke normal without bruit, jugular venous pressure is normal Abdomen: Soft, non-tender, non-distended with normoactive bowel sounds. No hepatomegaly. No rebound/guarding. No obvious abdominal masses. Abdominal aorta is normal size without bruit Extremities: No edema. no cyanosis, no clubbing, no ulcers  Peripheral : 2+ bilateral upper extremity pulses, 2+ bilateral femoral pulses, 2+ bilateral dorsal pedal pulse Neuro: Alert and oriented. No facial asymmetry. No focal deficit. Moves all extremities spontaneously. Musculoskeletal: Normal muscle tone without kyphosis Psych:  Responds to questions appropriately with a normal affect.    Assessment: 59 year old male with paroxysmal nonvalvular atrial fibrillation with recurrent syncope most significant due to sick sinus syndrome with atrial fibrillation with rapid ventricular rate 200 bpm and bradycardia otherwise at 40 bpm. Currently improved with better atrial fibrillation control with amiodarone drip. Patient understands the risk and benefits of treatment of amiodarone beta-blocker and use of pacemaker. He understands that he will need pacemaker placement for sick sinus syndrome and further intervention thereafter  Plan: 1. Continue amiodarone drip for another 18 hours for maintenance of normal sinus rhythm to reduce the possibility of rapid rate of  atrial fibrillation 2. Okay for discontinuation of metoprolol to reduce the possibility of beta-blockade in severe bradycardia with syncope 3. No other cardiac diagnostics necessary at this time 4. Will discuss with cardiology team  for the possibility of pacemaker placement tomorrow if able  Signed, Corey Skains M.D. [facility] 07/13/2020, 1:49 PM

## 2020-07-13 NOTE — ED Notes (Addendum)
Pt provided with meal tray. Family at bedside. Family provided with recliner for comfort. Hospital bed requested at this time

## 2020-07-13 NOTE — ED Notes (Signed)
Wife and pt aware of room assignnet and that she cannot go upstairs with pt.

## 2020-07-13 NOTE — ED Notes (Signed)
Pt c/o increased pressure again in his bladder. Pt bladder scanned. Pt retaining 666mL. MD made aware.

## 2020-07-13 NOTE — H&P (Signed)
[place]   PATIENT NAME: John Hanna    MR#:  [id number]  DATE OF BIRTH:  10-16-1961  DATE OF ADMISSION:  07/12/2020  PRIMARY CARE PHYSICIAN: [name], MD   REQUESTING/REFERRING PHYSICIAN: Arta Silence, MD CHIEF COMPLAINT:   Chief Complaint  Patient presents with  . Other    rapid heart rate  Near syncope and syncope HISTORY OF PRESENT ILLNESS:  John Hanna  is a 59 y.o. Caucasian male with a known history of osteoarthritis, atrial fibrillation and hypertension, who presented to the emergency room with acute onset of palpitations with chest pain felt this fluttering, near syncope with lightheadedness and syncope while he was riding with his wife in the car.  He admitted to associated dyspnea without cough or wheezing.  He was noted to be in paroxysmal atrial fibrillation with rapid ventricular response.  He was expected to have a pacemaker placement next month.  He has been taking his Lopressor regularly.  No fever or chills.  No nausea or vomiting or abdominal pain or diarrhea or melena or bright red bleeding per rectum.  No dysuria, oliguria or hematuria or flank pain.  No bleeding diathesis.  Upon presentation to the emergency room, heart rate of 46 and later on 100 then 124, respiratory rate was normal and later was 22 then 17.  Labs revealed borderline potassium of 3.5 and otherwise unremarkable CMP BNP was 123.8 and high-sensitivity troponin I was 9.  CBC showed symptoms of 18.8 with neutrophilia respiratory panel is currently pending.  Portable chest ray showed no acute cardiopulmonary disease. Initial EKG showed atrial fibrillation with rapid ventricle response of 132 with teasing peers repeat EKG showed rate of 117 and a repeat EKG showed SVT with a rate of 197.   He was given 5 mg of IV Lopressor. Marland KitchenHe continued to be in atrial fibrillation his rate was up to 150.  I started him on IV amiodarone with bolus and drip.  He will be admitted to a medical  monitored bed for further evaluation and management. PAST MEDICAL HISTORY:   Past Medical History:  Diagnosis Date  . Arthritis   . Atrial fibrillation ([place])   . Hypertension     PAST SURGICAL HISTORY:   Past Surgical History:  Procedure Laterality Date  . COLONOSCOPY  01/2011   repeat 3 yrs - @ [facility] internal medicine  . HERNIA REPAIR  1992    SOCIAL HISTORY:   Social History   Tobacco Use  . Smoking status: Former Smoker    Packs/day: 0.50    Years: 5.00    Pack years: 2.50    Types: Cigarettes    Quit date: 10/17/1987    Years since quitting: 32.7  . Smokeless tobacco: Never Used  Substance Use Topics  . Alcohol use: No    Alcohol/week: 0.0 standard drinks    FAMILY HISTORY:   Family History  Problem Relation Age of Onset  . Colon cancer Mother        died age 34  . Hypertension Father   . Prostate cancer Father 55    DRUG ALLERGIES:   Allergies  Allergen Reactions  . Cyclobenzaprine Other (See Comments)    Acute urinary retention    REVIEW OF SYSTEMS:   ROS As per history of present illness. All pertinent systems were reviewed above. Constitutional, HEENT, cardiovascular, respiratory, GI, GU, musculoskeletal, neuro, psychiatric, endocrine, integumentary and hematologic systems were reviewed and are otherwise negative/unremarkable except for positive  findings mentioned above in the HPI.   MEDICATIONS AT HOME:   Prior to Admission medications   Medication Sig Start Date End Date Taking? Authorizing Provider  fluticasone (FLONASE) 50 MCG/ACT nasal spray Place 1 spray into both nostrils daily.    Yes [provider]  metoprolol succinate (TOPROL-XL) 50 MG 24 hr tablet Take 75 mg by mouth daily.  05/29/19  Yes [provider]  NON FORMULARY CPAP Nightly.   Yes [provider]  potassium chloride (KLOR-CON) 10 MEQ tablet Take 10 mEq by mouth daily. 06/22/20  Yes [provider]  tamsulosin (FLOMAX) 0.4 MG CAPS  capsule Take 1 capsule (0.4 mg total) by mouth daily. Patient taking differently: Take 0.4 mg by mouth daily at 6 PM.  07/07/20  Yes [name], MD  valsartan-hydrochlorothiazide (DIOVAN-HCT) 320-25 MG tablet Take 1 tablet by mouth once daily Patient taking differently: Take 1 tablet by mouth daily.  08/24/19  Yes [name], MD  aspirin EC 81 MG tablet Take 81 mg by mouth daily.  Patient not taking: Reported on 07/12/2020    [provider]      VITAL SIGNS:  Blood pressure 109/81, pulse (!) 106, resp. rate 11, height 6\' 2"  (1.88 m), weight 101.5 kg, SpO2 93 %.  PHYSICAL EXAMINATION:  Physical Exam  GENERAL:  59 y.o.-year-old Caucasian male patient lying in the bed with no acute distress.  EYES: Pupils equal, round, reactive to light and accommodation. No scleral icterus. Extraocular muscles intact.  HEENT: Head atraumatic, normocephalic. Oropharynx and nasopharynx clear.  NECK:  Supple, no jugular venous distention. No thyroid enlargement, no tenderness.  LUNGS: Normal breath sounds bilaterally, no wheezing, rales,rhonchi or crepitation. No use of accessory muscles of respiration.  CARDIOVASCULAR: Regular rate rhythm with normal S1-S2 and no murmurs gallops or rubs.  He just converted to normal sinus rhythm during my interview. ABDOMEN: Soft, nondistended, nontender. Bowel sounds present. No organomegaly or mass.  EXTREMITIES: No pedal edema, cyanosis, or clubbing.  NEUROLOGIC: Cranial nerves II through XII are intact. Muscle strength 5/5 in all extremities. Sensation intact. Gait not checked.  PSYCHIATRIC: The patient is alert and oriented x 3.  Normal affect and good eye contact. SKIN: No obvious rash, lesion, or ulcer.   LABORATORY PANEL:   CBC Recent Labs  Lab 07/12/20 2316  WBC 18.8*  HGB 14.0  HCT 40.9  PLT 270   ------------------------------------------------------------------------------------------------------------------  Chemistries  Recent  Labs  Lab 07/12/20 2316  NA 136  K 3.5  CL 98  CO2 26  GLUCOSE 126*  BUN 12  CREATININE 1.22  CALCIUM 9.1  AST 15  ALT 20  ALKPHOS 47  BILITOT 0.9   ------------------------------------------------------------------------------------------------------------------  Cardiac Enzymes No results for input(s): TROPONINI in the last 168 hours. ------------------------------------------------------------------------------------------------------------------  RADIOLOGY:  DG Chest Portable 1 View  Result Date: 07/12/2020 CLINICAL DATA:  Atrial fibrillation EXAM: PORTABLE CHEST 1 VIEW COMPARISON:  06/19/2020 FINDINGS: The heart size and mediastinal contours are within normal limits. Both lungs are clear. The visualized skeletal structures are unremarkable. IMPRESSION: No active disease. Electronically Signed   By: Donavan Foil M.D.   On: 07/12/2020 23:31      IMPRESSION AND PLAN:   1.  Paroxysmal atrial fibrillation with a ventricular response as well as intermittent supraventricular tachycardia. -The patient will be admitted to a progressive unit bed. -We will optimize potassium that is currently borderline check magnesium level. -For borderline blood pressure will utilized IV digoxin and given hypotensive with persistently  rapid ventricular response he was started on IV amiodarone with IV bolus and drip and converted to normal sinus rhythm. -His CHA2DS2-VASc score is 1. -We will continue IV amiodarone for now. -We will follow troponin I's. -Cardiology consultation will be obtained. -Dr. Nehemiah Massed is aware about the patient.  2.  Hypertension. -We will continue has Diovan HCT and Toprol-XL.  3.  BPH. -We will continue Flomax.  4.  Coronary artery disease. -We will continue blocker therapy.  5.  Obstructive sleep apnea on home CPAP. -Continue CPAP.  6.  DVT prophylaxis. -Subcutaneous Lovenox.   All the records are reviewed and case discussed with ED provider. The plan  of care was discussed in details with the patient (and family). I answered all questions. The patient agreed to proceed with the above mentioned plan. Further management will depend upon hospital course.   CODE STATUS: Full code  Status is: Inpatient  Remains inpatient appropriate because:Hemodynamically unstable, Ongoing diagnostic testing needed not appropriate for outpatient work up, Unsafe d/c plan, IV treatments appropriate due to intensity of illness or inability to take PO and Inpatient level of care appropriate due to severity of illness   Dispo: The patient is from: Home              Anticipated d/c is to: Home              Anticipated d/c date is: 2 days              Patient currently is not medically stable to d/c.   TOTAL TIME TAKING CARE OF THIS PATIENT: 55 minutes.    Christel Mormon M.D on 07/13/2020 at 2:57 AM  [facility]   From 7 PM-7 AM, contact night-coverage [email or web address]  CC: Primary care physician; [name], MD

## 2020-07-13 NOTE — ED Notes (Signed)
Pt states that his abdomen is hurting at this time, this RN bladder scanned patient and notified Dr. Sidney Ace. Order placed for in & out.

## 2020-07-13 NOTE — Progress Notes (Addendum)
  PROGRESS NOTE    John Hanna  VOJ:[id number] DOB: Feb 24, 1961 DOA: 07/12/2020  PCP: [name], MD    LOS - 0    Patient admitted earlier this AM with paroxysmal A-fib RVR causing syncopal episodes.  Also had SVT while in the ED and bradycardia.  Patient has sick sinus syndrome.  Interval subjective: seen in the ED holding for a bed with wife at bedside.  They are both tire, up all night.  Pt reports feeling okay this AM.  No chest pain, dizziness, lightheadedness.  HR controlled on amio gtt at this time. They ask about getting pacemaker placed this admission (is scheduled to get it next week).  Exam: lungs clear, heart RRR, no edema, abdomen nontender   Principal Problem:   Paroxysmal atrial fibrillation with RVR (HCC) Active Problems:   Acute urinary retention   Essential (primary) hypertension   BPH associated with nocturia   OSA on CPAP    I have reviewed the full H&P by Dr. Sidney Ace in detail, and I agree with the assessment and plan as outlined therein. In addition: --Acute urinary retention - had to place Foley. Urology consulted --replace potassium --CPAP qhs for OSA --replace K and Mg for goal K>4.0, Mg>2.0 given arrhythmias   No Charge    Ezekiel Slocumb, DO [facility]   If 7PM-7AM, please contact night-coverage [email or web address] 07/13/2020, 3:12 PM

## 2020-07-14 ENCOUNTER — Ambulatory Visit: Payer: BC Managed Care – PPO | Admitting: Internal Medicine

## 2020-07-14 LAB — BASIC METABOLIC PANEL
Anion gap: 9 (ref 5–15)
BUN: 10 mg/dL (ref 6–20)
CO2: 28 mmol/L (ref 22–32)
Calcium: 8.7 mg/dL — ABNORMAL LOW (ref 8.9–10.3)
Chloride: 102 mmol/L (ref 98–111)
Creatinine, Ser: 1.2 mg/dL (ref 0.61–1.24)
GFR calc Af Amer: >60 mL/min (ref >60)
GFR calc non Af Amer: >60 mL/min (ref >60)
Glucose, Bld: 121 mg/dL — ABNORMAL HIGH (ref 70–99)
Potassium: 3.5 mmol/L (ref 3.5–5.1)
Sodium: 139 mmol/L (ref 135–145)

## 2020-07-14 LAB — MRSA PCR SCREENING: MRSA by PCR: NEGATIVE

## 2020-07-14 LAB — MAGNESIUM: Magnesium: 2.1 mg/dL (ref 1.7–2.4)

## 2020-07-14 LAB — CBC
HCT: 36.3 % — ABNORMAL LOW (ref 39.0–52.0)
Hemoglobin: 12 g/dL — ABNORMAL LOW (ref 13.0–17.0)
MCH: 30.2 pg (ref 26.0–34.0)
MCHC: 33.1 g/dL (ref 30.0–36.0)
MCV: 91.2 fL (ref 80.0–100.0)
Platelets: 224 10*3/uL (ref 150–400)
RBC: 3.98 MIL/uL — ABNORMAL LOW (ref 4.22–5.81)
RDW: 12.8 % (ref 11.5–15.5)
WBC: 13 10*3/uL — ABNORMAL HIGH (ref 4.0–10.5)
nRBC: 0 % (ref 0.0–0.2)

## 2020-07-14 MED ORDER — AMIODARONE IV BOLUS ONLY 150 MG/100ML
150.0000 mg | Freq: Once | INTRAVENOUS | Status: AC
  Administered 2020-07-14: 150 mg via INTRAVENOUS
  Filled 2020-07-14: qty 100

## 2020-07-14 MED ORDER — CHLORHEXIDINE GLUCONATE CLOTH 2 % EX PADS
6.0000 | MEDICATED_PAD | Freq: Every day | CUTANEOUS | Status: DC
  Administered 2020-07-14 – 2020-07-16 (×2): 6 via TOPICAL

## 2020-07-14 MED ORDER — AMIODARONE HCL 200 MG PO TABS
200.0000 mg | ORAL_TABLET | Freq: Two times a day (BID) | ORAL | Status: DC
  Administered 2020-07-14: 200 mg via ORAL
  Filled 2020-07-14 (×2): qty 1

## 2020-07-14 MED ORDER — AMIODARONE HCL IN DEXTROSE 360-4.14 MG/200ML-% IV SOLN
30.0000 mg/h | INTRAVENOUS | Status: DC
  Administered 2020-07-14 – 2020-07-16 (×7): 30 mg/h via INTRAVENOUS
  Filled 2020-07-14 (×7): qty 200

## 2020-07-14 MED ORDER — POTASSIUM CHLORIDE CRYS ER 20 MEQ PO TBCR
40.0000 meq | EXTENDED_RELEASE_TABLET | Freq: Once | ORAL | Status: AC
  Administered 2020-07-14: 40 meq via ORAL
  Filled 2020-07-14: qty 2

## 2020-07-14 MED ORDER — SODIUM CHLORIDE 0.9% FLUSH
3.0000 mL | Freq: Two times a day (BID) | INTRAVENOUS | Status: DC
  Administered 2020-07-14 – 2020-07-16 (×5): 3 mL via INTRAVENOUS

## 2020-07-14 MED ORDER — ONDANSETRON HCL 4 MG/2ML IJ SOLN: 4.0000 mg | Freq: Four times a day (QID) | INTRAMUSCULAR | Status: DC | PRN

## 2020-07-14 MED ORDER — METOPROLOL TARTRATE 25 MG PO TABS
25.0000 mg | ORAL_TABLET | Freq: Two times a day (BID) | ORAL | Status: DC
  Administered 2020-07-14 – 2020-07-16 (×5): 25 mg via ORAL
  Filled 2020-07-14 (×5): qty 1

## 2020-07-14 NOTE — Progress Notes (Signed)
   07/14/20 1600  Vitals  Temp 98.8 F (37.1 C)  Temp Source Oral  BP 118/70  MAP (mmHg) 85  BP Location Right Arm  BP Method Automatic  ECG Heart Rate (!) 180  Resp (!) 24  Level of Consciousness  Level of Consciousness Alert  MEWS COLOR  MEWS Score Color Red  Oxygen Therapy  SpO2 98 %  O2 Device Room Air  Pain Assessment  Pain Scale 0-10  Pain Type Acute pain  Pain Location Head  Pain Orientation Anterior  Pain Descriptors / Indicators Headache  Pain Frequency Intermittent  Pain Onset Gradual  Patients Stated Pain Goal 0  Pain Intervention(s) Medication (See eMAR);RN made aware  MEWS Score  MEWS Temp 0  MEWS Systolic 0  MEWS Pulse 3  MEWS RR 1  MEWS LOC 0  MEWS Score 4  Provider Notification  Provider Name/Title Dr. Manuella Ghazi and Nehemiah Massed  Date Provider Notified 07/14/20  Time Provider Notified 1601  Notification Type Page (secure chat )  Notification Reason Change in status  Response See new orders (transfer to ICU )  Date of Provider Response 07/14/20  Time of Provider Response [phone]

## 2020-07-14 NOTE — Progress Notes (Signed)
[facility] Encounter Note  Patient: John Hanna / Admit Date: 07/12/2020 / Date of Encounter: 07/14/2020, 5:04 PM   Subjective: Patient patient admitted for apparent sick sinus syndrome with syncope and atrial fibrillation needing pacemaker placement and other medical management.  Amiodarone drip was given with control of heart rate and maintenance of normal sinus rhythm with no issues.  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  This may be from right ventricular origin and or could possibly be accessory pathway although baseline EKG shows no evidence of short PR interval or delta wave.  There has been no evidence of atrial fibrillation.  With these rhythm disturbances the patient has not had any congestive heart failure and/or anginal symptoms or EKG changes consistent with ischemia.  Previous evaluation with cardiac MRI shows some mild changes in the inferior wall possibly ischemic and or infarct of unknown etiology but the patient does not have any significant risk factors for coronary artery disease.  After review of EKGs and rhythm disturbances the patient will need further evaluation including the possibility of electrophysiologic study.  Prior to this the patient will need further evaluation of coronary artery disease and abnormal MRI with cardiac catheterization.  Patient understands all of the risk and benefits of treatments and otherwise will continue treatment with amiodarone and beta-blocker which have been stabilized the patient at this time.  Review of Systems: Positive for: Presyncope weakness palpitations Negative for: Vision change, hearing change, syncope, dizziness, nausea, vomiting,diarrhea, bloody stool, stomach pain,  cough, congestion, diaphoresis, urinary frequency, urinary pain,skin lesions, skin rashes Others previously listed  Objective: Telemetry: Supraventricular tachycardia and ventricular tachycardia Physical Exam: Blood pressure 118/70, pulse 89, temperature 98.8 F (37.1 C), resp. rate (!) 24, height 6\' 2"  (1.88 m), weight 101.5 kg, SpO2 98 %. Body mass index is 28.73 kg/m. General: Well developed, well nourished, in no acute distress. Head: Normocephalic, atraumatic, sclera non-icteric, no xanthomas, nares are without discharge. Neck: No apparent masses Lungs: Normal respirations with no wheezes, no rhonchi, no rales , no crackles   Heart: Regular rate and rhythm, normal S1 S2, no murmur, no rub, no gallop, PMI is normal size and placement, carotid upstroke normal without bruit, jugular venous pressure normal Abdomen: Soft, non-tender, non-distended with normoactive bowel sounds. No hepatosplenomegaly. Abdominal aorta is normal size without bruit Extremities: No edema, no clubbing, no cyanosis, no ulcers,  Peripheral: 2+ radial, 2+ femoral, 2+ dorsal pedal pulses Neuro: Alert and oriented. Moves all extremities spontaneously. Psych:  Responds to questions appropriately with a normal affect.   Intake/Output Summary (Last 24 hours) at 07/14/2020 1704 Last data filed at 07/14/2020 1608 Gross per 24 hour  Intake 2120 ml  Output 1300 ml  Net 820 ml    Inpatient Medications:   Chlorhexidine Gluconate Cloth  6 each Topical Daily   enoxaparin (LOVENOX) injection  40 mg Subcutaneous Q24H   fluticasone  1 spray Each Nare Daily   irbesartan  300 mg Oral Daily   Or   hydrochlorothiazide  25 mg Oral Daily   metoprolol tartrate  25 mg Oral BID   potassium chloride  10 mEq Oral Daily   tamsulosin  0.4 mg Oral q1800   Infusions:   amiodarone 30 mg/hr (07/14/20 1412)   cefTRIAXone (ROCEPHIN)  IV Stopped (07/13/20 2048)  Labs: Recent Labs    07/12/20 2316 07/13/20 0226  07/13/20 0341 07/14/20 0440  NA   < >  --  135 139  K   < >  --  3.6 3.5  CL   < >  --  101 102  CO2   < >  --  25 28  GLUCOSE   < >  --  145* 121*  BUN   < >  --  11 10  CREATININE   < >  --  1.22 1.20  CALCIUM   < >  --  8.8* 8.7*  MG  --  2.0  --  2.1   < > = values in this interval not displayed.   Recent Labs    07/12/20 2316  AST 15  ALT 20  ALKPHOS 47  BILITOT 0.9  PROT 7.6  ALBUMIN 4.0   Recent Labs    07/12/20 2316 07/12/20 2316 07/13/20 0341 07/14/20 0440  WBC 18.8*   < > 16.1* 13.0*  NEUTROABS 14.1*  --   --   --   HGB 14.0   < > 12.7* 12.0*  HCT 40.9   < > 38.1* 36.3*  MCV 88.5   < > 90.1 91.2  PLT 270   < > 236 224   < > = values in this interval not displayed.   No results for input(s): CKTOTAL, CKMB, TROPONINI in the last 72 hours. Invalid input(s): POCBNP No results for input(s): HGBA1C in the last 72 hours.   Weights: Filed Weights   07/12/20 2313  Weight: 101.5 kg     Radiology/Studies:  DG Chest 2 View  Result Date: 06/19/2020 CLINICAL DATA:  Chest pain.  History of atrial fibrillation. EXAM: CHEST - 2 VIEW COMPARISON:  09/28/2019 FINDINGS: Midline trachea. Normal heart size. No pleural effusion or pneumothorax. Subsegmental atelectasis or interval scarring in the left lung base laterally. No lobar consolidation. Mild biapical pleuroparenchymal scarring. IMPRESSION: No acute cardiopulmonary disease. Electronically Signed   By: Abigail Miyamoto M.D.   On: 06/19/2020 16:15   DG Chest Portable 1 View  Result Date: 07/12/2020 CLINICAL DATA:  Atrial fibrillation EXAM: PORTABLE CHEST 1 VIEW COMPARISON:  06/19/2020 FINDINGS: The heart size and mediastinal contours are within normal limits. Both lungs are clear. The visualized skeletal structures are unremarkable. IMPRESSION: No active disease. Electronically Signed   By: Donavan Foil M.D.   On: 07/12/2020 23:31     Assessment and Recommendation  59 y.o. male with known apparent paroxysmal  nonvalvular atrial fibrillation with episodes of syncope presyncope now with other rhythm disturbances revealed today likely the primary cause of syncope presyncope including supraventricular tachycardia possibly consistent with AV node reentrant tachycardia and or of ventricular tachycardia with a left bundle branch pattern needing further evaluation and treatment options 1.  We will continue amiodarone and beta-blocker which have been ordered for this current issue and stabilized with amiodarone drip maintenance and oral beta-blocker 25 mg twice per day 2.  Cardiac catheterization to assess coronary anatomy and concerns of minor changes by cardiac MRI in the past with definition prior to further investigation and treatment of rhythm disturbances. 3.  Further evaluation and treatment options of rhythm disturbance with electrophysiologic study.  Have discussed with at tertiary care for transfer and will occur when able although all EMTALA and arrangements already made 4.  Further treatment options after above  Signed, Serafina Royals M.D. FACC

## 2020-07-14 NOTE — Progress Notes (Signed)
[facility] Encounter Note  Patient: John Hanna / Admit Date: 07/12/2020 / Date of Encounter: 07/14/2020, 8:48 AM   Subjective: Patient feeling much better today with no further evidence of atrial fibrillation sick sinus syndrome and or syncope.  With amiodarone infusion the patient has maintained normal sinus rhythm with an excellent heart rate control at 70 bpm.  Due to this issue the patient will need a pacemaker at some point due to previous concerns of atrial fibrillation with rapid ventricular rate as well as bradycardia due to beta-blocker.  With amiodarone use the patient will likely not have as much bradycardia and will maintain normal sinus rhythm and reduce the possibility of tachycardia.  We have discussed the length that the patient may need a pacemaker during hospitalization if this is continuing to be sick sinus syndrome but currently well stable and may be able to be discharged to home from cardiac standpoint on current medical regimen without the use of metoprolol as well as following up with his scheduled pacemaker placement early next week.  Patient also has options of transferring for pacemaker placement as well  Review of Systems: Positive for: None Negative for: Vision change, hearing change, syncope, dizziness, nausea, vomiting,diarrhea, bloody stool, stomach pain, cough, congestion, diaphoresis, urinary frequency, urinary pain,skin lesions, skin rashes Others previously listed  Objective: Telemetry: Normal sinus rhythm Physical Exam: Blood pressure 128/84, pulse 73, temperature 98 F (36.7 C), temperature source Oral, resp. rate 16, height 6\' 2"  (1.88 m), weight 101.5 kg, SpO2 98 %. Body mass index is 28.73 kg/m. General: Well developed, well nourished, in no acute distress. Head: Normocephalic, atraumatic, sclera non-icteric, no xanthomas, nares are without discharge. Neck: No apparent masses Lungs: Normal respirations with no wheezes, no  rhonchi, no rales , no crackles   Heart: Regular rate and rhythm, normal S1 S2, no murmur, no rub, no gallop, PMI is normal size and placement, carotid upstroke normal without bruit, jugular venous pressure normal Abdomen: Soft, non-tender, non-distended with normoactive bowel sounds. No hepatosplenomegaly. Abdominal aorta is normal size without bruit Extremities: No edema, no clubbing, no cyanosis, no ulcers,  Peripheral: 2+ radial, 2+ femoral, 2+ dorsal pedal pulses Neuro: Alert and oriented. Moves all extremities spontaneously. Psych:  Responds to questions appropriately with a normal affect.   Intake/Output Summary (Last 24 hours) at 07/14/2020 0848 Last data filed at 07/14/2020 0500 Gross per 24 hour  Intake 613.14 ml  Output 400 ml  Net 213.14 ml    Inpatient Medications:  . amiodarone  200 mg Oral BID  . Chlorhexidine Gluconate Cloth  6 each Topical Daily  . enoxaparin (LOVENOX) injection  40 mg Subcutaneous Q24H  . fluticasone  1 spray Each Nare Daily  . irbesartan  300 mg Oral Daily   Or  . hydrochlorothiazide  25 mg Oral Daily  . potassium chloride  10 mEq Oral Daily  . tamsulosin  0.4 mg Oral q1800   Infusions:  . cefTRIAXone (ROCEPHIN)  IV Stopped (07/13/20 2048)    Labs: Recent Labs    07/12/20 2316 07/13/20 0226 07/13/20 0341 07/14/20 0440  NA   < >  --  135 139  K   < >  --  3.6 3.5  CL   < >  --  101 102  CO2   < >  --  25 28  GLUCOSE   < >  --  145* 121*  BUN   < >  --  11 10  CREATININE   < >  --  1.22 1.20  CALCIUM   < >  --  8.8* 8.7*  MG  --  2.0  --  2.1   < > = values in this interval not displayed.   Recent Labs    07/12/20 2316  AST 15  ALT 20  ALKPHOS 47  BILITOT 0.9  PROT 7.6  ALBUMIN 4.0   Recent Labs    07/12/20 2316 07/12/20 2316 07/13/20 0341 07/14/20 0440  WBC 18.8*   < > 16.1* 13.0*  NEUTROABS 14.1*  --   --   --   HGB 14.0   < > 12.7* 12.0*  HCT 40.9   < > 38.1* 36.3*  MCV 88.5   < > 90.1 91.2  PLT 270   < > 236  224   < > = values in this interval not displayed.   No results for input(s): CKTOTAL, CKMB, TROPONINI in the last 72 hours. Invalid input(s): POCBNP No results for input(s): HGBA1C in the last 72 hours.   Weights: Filed Weights   07/12/20 2313  Weight: 101.5 kg     Radiology/Studies:  DG Chest 2 View  Result Date: 06/19/2020 CLINICAL DATA:  Chest pain.  History of atrial fibrillation. EXAM: CHEST - 2 VIEW COMPARISON:  09/28/2019 FINDINGS: Midline trachea. Normal heart size. No pleural effusion or pneumothorax. Subsegmental atelectasis or interval scarring in the left lung base laterally. No lobar consolidation. Mild biapical pleuroparenchymal scarring. IMPRESSION: No acute cardiopulmonary disease. Electronically Signed   By: Abigail Miyamoto M.D.   On: 06/19/2020 16:15   DG Chest Portable 1 View  Result Date: 07/12/2020 CLINICAL DATA:  Atrial fibrillation EXAM: PORTABLE CHEST 1 VIEW COMPARISON:  06/19/2020 FINDINGS: The heart size and mediastinal contours are within normal limits. Both lungs are clear. The visualized skeletal structures are unremarkable. IMPRESSION: No active disease. Electronically Signed   By: Donavan Foil M.D.   On: 07/12/2020 23:31     Assessment and Recommendation  59 y.o. male with known paroxysmal nonvalvular atrial fibrillation with sick sinus syndrome now maintaining normal sinus rhythm without evidence of sick sinus syndrome on amiodarone after infusion and doing well with no evidence of heart failure or myocardial infarction 1.  Continue amiodarone but discontinue intravenously and change to 200 mg orally twice per day 2.  No use of beta-blocker due to concerns of bradycardia and the possibility of other sick sinus syndrome 3.  Begin ambulation and follow for improvements of symptoms and if patient is completely asymptomatic okay for discharged home from cardiac standpoint with follow-up with his currently scheduled pacemaker placement early next week. 4.  If  patient has symptoms with ambulation other options can be entertained including transfer for pacemaker placement if necessary 5.  No further treatment with anticoagulation at this time due to need for pacemaker placement and invasive procedure as well as low chads score Signed, Serafina Royals M.D. FACC

## 2020-07-14 NOTE — Progress Notes (Addendum)
Patient's HR jumped to 180 again.   Symptomatic during episode- c/o tingling starting at toes and working it way up body.   Patient transferred to ICU

## 2020-07-14 NOTE — Progress Notes (Signed)
Pt continues to have runs of vtach, pt symptomatic, BP 114/71. ICU charge at bedside to asses pt. Per Dr. Nehemiah Massed give a second bolus of Amio. Will give and continue to monitor.

## 2020-07-14 NOTE — Progress Notes (Signed)
Ambulated pt around nurses x2 and tolerated well.VSS.

## 2020-07-14 NOTE — Progress Notes (Signed)
1       PROGRESS NOTE    Ryne Mctigue  OZH:[id number] DOB: 1961/08/03 DOA: 07/12/2020 PCP: [name], MD   Brief Narrative: Per dictated history and physical.  Makenzie Vittorio  is a 59 y.o. Caucasian male with a known history of osteoarthritis, atrial fibrillation and hypertension, who presented to the emergency room with acute onset of palpitations with chest pain and fluttering, near syncope with lightheadedness and syncope while he was riding with his wife in the car.  He admitted to associated dyspnea without cough or wheezing.  He was noted to be in paroxysmal atrial fibrillation with rapid ventricular response.  He was expected to have a pacemaker placement next month.  He has been taking his Lopressor regularly.   Upon presentation to the emergency room, heart rate of 46 and later on 100 then 124, respiratory rate was normal and later was 22 then 17.  Labs revealed borderline potassium of 3.5 and otherwise unremarkable CMP BNP was 123.8 and high-sensitivity troponin I was 9.  CBC showed symptoms of 18.8 with neutrophilia respiratory panel is currently pending.  Portable chest ray showed no acute cardiopulmonary disease. Initial EKG showed atrial fibrillation with rapid ventricle response of 132 with teasing peers repeat EKG showed rate of 117 and a repeat EKG showed SVT with a rate of 197.   He was given 5 mg of IV Lopressor. Marland KitchenHe continued to be in atrial fibrillation his rate was up to 150.  He was started on on IV amiodarone with bolus and drip.   Assessment & Plan:   Principal Problem:   Paroxysmal atrial fibrillation with RVR (HCC) Active Problems:   Essential (primary) hypertension   BPH associated with nocturia   OSA on CPAP   Acute urinary retention  Paroxysmal A. fib with RVR and intermittent SVT/NSVT Started on amiodarone drip with bolus per cardiology Consideration for pacemaker placement while here versus outpatient per cardiology  Acute urinary  retention Continue Foley catheter and daily Flomax Appreciate urology input.  Will need outpatient voiding trial in 7 to 10 days with Dr. Cherrie Gauze office Await urine culture Continue empiric Rocephin and Diflucan  Essential hypertension Continue Avapro and HCTZ  Coronary artery disease Stable at this time   DVT prophylaxis:  enoxaparin (LOVENOX) injection 40 mg Start: 07/13/20 1000     Code Status: Full Code Family Communication: Wife at bedside  disposition Plan: In next 1 to 2 days depending on cardiology plan.  He may need pacemaker placement while here versus outpatient which will decide his disposition Status is: Inpatient  Remains inpatient appropriate because:Inpatient level of care appropriate due to severity of illness   Dispo: The patient is from: Home              Anticipated d/c is to: Home              Anticipated d/c date is: 2 days              Patient currently is not medically stable to d/c.   Consultants:   Cardiology  Procedures: Foley remains indwelling -May need permanent pacemaker     Antimicrobials: IV Rocephin started 9/28   Subjective: Reports some epistaxis and wife not feeling comfortable taking him home with heart rate fluctuation.  Wife requesting ENT referral with Dr. Pryor Ochoa at discharge  Objective: Vitals:   07/14/20 1235 07/14/20 1245 07/14/20 1326 07/14/20 1341  BP: 111/82 139/90 121/75 130/71  Pulse:   89   Resp:  Temp:   98.7 F (37.1 C)   TempSrc:   Oral   SpO2:      Weight:      Height:        Intake/Output Summary (Last 24 hours) at 07/14/2020 1424 Last data filed at 07/14/2020 0730 Gross per 24 hour  Intake 613.14 ml  Output 400 ml  Net 213.14 ml   Filed Weights   07/12/20 2313  Weight: 101.5 kg    Examination:  General exam: Appears calm and comfortable  Respiratory system: Clear to auscultation. Respiratory effort normal. Cardiovascular system: S1 & S2 heard, RRR. No JVD, murmurs, rubs, gallops or  clicks. No pedal edema. Gastrointestinal system: Abdomen is nondistended, soft and nontender. No organomegaly or masses felt. Normal bowel sounds heard. Central nervous system: Alert and oriented. No focal neurological deficits. Extremities: Symmetric 5 x 5 power. Skin: No rashes, lesions or ulcers Psychiatry: Judgement and insight appear normal. Mood & affect appropriate.     Data Reviewed: I have personally reviewed following labs and imaging studies  CBC: Recent Labs  Lab 07/12/20 2316 07/13/20 0341 07/14/20 0440  WBC 18.8* 16.1* 13.0*  NEUTROABS 14.1*  --   --   HGB 14.0 12.7* 12.0*  HCT 40.9 38.1* 36.3*  MCV 88.5 90.1 91.2  PLT 270 236 300   Basic Metabolic Panel: Recent Labs  Lab 07/12/20 2316 07/13/20 0226 07/13/20 0341 07/14/20 0440  NA 136  --  135 139  K 3.5  --  3.6 3.5  CL 98  --  101 102  CO2 26  --  25 28  GLUCOSE 126*  --  145* 121*  BUN 12  --  11 10  CREATININE 1.22  --  1.22 1.20  CALCIUM 9.1  --  8.8* 8.7*  MG  --  2.0  --  2.1   GFR: Estimated Creatinine Clearance: 87.4 mL/min (by C-G formula based on SCr of 1.2 mg/dL). Liver Function Tests: Recent Labs  Lab 07/12/20 2316  AST 15  ALT 20  ALKPHOS 47  BILITOT 0.9  PROT 7.6  ALBUMIN 4.0   No results for input(s): LIPASE, AMYLASE in the last 168 hours. No results for input(s): AMMONIA in the last 168 hours. Coagulation Profile: No results for input(s): INR, PROTIME in the last 168 hours. Cardiac Enzymes: No results for input(s): CKTOTAL, CKMB, CKMBINDEX, TROPONINI in the last 168 hours. BNP (last 3 results) No results for input(s): PROBNP in the last 8760 hours. HbA1C: No results for input(s): HGBA1C in the last 72 hours. CBG: No results for input(s): GLUCAP in the last 168 hours. Lipid Profile: Recent Labs    07/13/20 0341  CHOL 134  HDL 48  LDLCALC 74  TRIG 61  CHOLHDL 2.8   Thyroid Function Tests: Recent Labs    07/13/20 0226  TSH 1.655  FREET4 1.14*   Anemia  Panel: No results for input(s): VITAMINB12, FOLATE, FERRITIN, TIBC, IRON, RETICCTPCT in the last 72 hours. Sepsis Labs: No results for input(s): PROCALCITON, LATICACIDVEN in the last 168 hours.  Recent Results (from the past 240 hour(s))  Respiratory Panel by RT PCR (Flu A&B, Covid) - Nasopharyngeal Swab     Status: None   Collection Time: 07/13/20  2:26 AM   Specimen: Nasopharyngeal Swab  Result Value Ref Range Status   SARS Coronavirus 2 by RT PCR NEGATIVE NEGATIVE Final    Comment: (NOTE) SARS-CoV-2 target nucleic acids are NOT DETECTED.  The SARS-CoV-2 RNA is generally detectable in upper respiratoy  specimens during the acute phase of infection. The lowest concentration of SARS-CoV-2 viral copies this assay can detect is 131 copies/mL. A negative result does not preclude SARS-Cov-2 infection and should not be used as the sole basis for treatment or other patient management decisions. A negative result may occur with  improper specimen collection/handling, submission of specimen other than nasopharyngeal swab, presence of viral mutation(s) within the areas targeted by this assay, and inadequate number of viral copies (<131 copies/mL). A negative result must be combined with clinical observations, patient history, and epidemiological information. The expected result is Negative.  Fact Sheet for Patients:  PinkCheek.be  Fact Sheet for Healthcare Providers:  GravelBags.it  This test is no t yet approved or cleared by the [place] FDA and  has been authorized for detection and/or diagnosis of SARS-CoV-2 by FDA under an Emergency Use Authorization (EUA). This EUA will remain  in effect (meaning this test can be used) for the duration of the COVID-19 declaration under Section 564(b)(1) of the Act, 21 U.S.C. section 360bbb-3(b)(1), unless the authorization is terminated or revoked sooner.     Influenza A by PCR  NEGATIVE NEGATIVE Final   Influenza B by PCR NEGATIVE NEGATIVE Final    Comment: (NOTE) The Xpert Xpress SARS-CoV-2/FLU/RSV assay is intended as an aid in  the diagnosis of influenza from Nasopharyngeal swab specimens and  should not be used as a sole basis for treatment. Nasal washings and  aspirates are unacceptable for Xpert Xpress SARS-CoV-2/FLU/RSV  testing.  Fact Sheet for Patients: PinkCheek.be  Fact Sheet for Healthcare Providers: GravelBags.it  This test is not yet approved or cleared by the [place] FDA and  has been authorized for detection and/or diagnosis of SARS-CoV-2 by  FDA under an Emergency Use Authorization (EUA). This EUA will remain  in effect (meaning this test can be used) for the duration of the  Covid-19 declaration under Section 564(b)(1) of the Act, 21  U.S.C. section 360bbb-3(b)(1), unless the authorization is  terminated or revoked. Performed at [facility], [address] [place], [place] [address]          Radiology Studies: DG Chest Portable 1 View  Result Date: 07/12/2020 CLINICAL DATA:  Atrial fibrillation EXAM: PORTABLE CHEST 1 VIEW COMPARISON:  06/19/2020 FINDINGS: The heart size and mediastinal contours are within normal limits. Both lungs are clear. The visualized skeletal structures are unremarkable. IMPRESSION: No active disease. Electronically Signed   By: Donavan Foil M.D.   On: 07/12/2020 23:31        Scheduled Meds: . Chlorhexidine Gluconate Cloth  6 each Topical Daily  . enoxaparin (LOVENOX) injection  40 mg Subcutaneous Q24H  . fluticasone  1 spray Each Nare Daily  . irbesartan  300 mg Oral Daily   Or  . hydrochlorothiazide  25 mg Oral Daily  . potassium chloride  10 mEq Oral Daily  . tamsulosin  0.4 mg Oral q1800   Continuous Infusions: . amiodarone 30 mg/hr (07/14/20 1412)  . cefTRIAXone (ROCEPHIN)  IV Stopped (07/13/20 2048)     LOS: 1 day     Time spent: 35 minutes    Mahari Strahm Manuella Ghazi, MD [facility] Pager [phone]  If 7PM-7AM, please contact night-coverage [email or web address] Password [facility] 07/14/2020, 2:24 PM

## 2020-07-14 NOTE — Progress Notes (Addendum)
Pt HR up in the 180's-200s, SVT followed by runs of V-TACH.Pt symptomatic. Pt states he feels very dizzy, lightheaded, and nauseous. Dr. Nehemiah Massed and Dr. Manuella Ghazi notified. Wife at bedside.  1245:Dr. Nehemiah Massed on the floor to assess pt. Per Dr. Nehemiah Massed restart Amio gtt and give 150 mg bolus. Will administer and continue to monitor closely.

## 2020-07-15 ENCOUNTER — Encounter
Admission: EM | Disposition: A | Discharge status: Other Acute Inpatient Hospital | Payer: Self-pay | Source: Home / Self Care | Attending: Internal Medicine

## 2020-07-15 ENCOUNTER — Encounter: Payer: Self-pay | Admitting: Internal Medicine

## 2020-07-15 ENCOUNTER — Other Ambulatory Visit: Payer: Self-pay

## 2020-07-15 DIAGNOSIS — I2510 Atherosclerotic heart disease of native coronary artery without angina pectoris: Secondary | ICD-10-CM

## 2020-07-15 HISTORY — PX: LEFT HEART CATH AND CORONARY ANGIOGRAPHY: CATH118249

## 2020-07-15 HISTORY — DX: Atherosclerotic heart disease of native coronary artery without angina pectoris: I25.10

## 2020-07-15 LAB — BASIC METABOLIC PANEL
Anion gap: 12 (ref 5–15)
BUN: 11 mg/dL (ref 6–20)
CO2: 27 mmol/L (ref 22–32)
Calcium: 8.6 mg/dL — ABNORMAL LOW (ref 8.9–10.3)
Chloride: 100 mmol/L (ref 98–111)
Creatinine, Ser: 1.15 mg/dL (ref 0.61–1.24)
GFR calc Af Amer: >60 mL/min (ref >60)
GFR calc non Af Amer: >60 mL/min (ref >60)
Glucose, Bld: 114 mg/dL — ABNORMAL HIGH (ref 70–99)
Potassium: 3.7 mmol/L (ref 3.5–5.1)
Sodium: 139 mmol/L (ref 135–145)

## 2020-07-15 SURGERY — LEFT HEART CATH AND CORONARY ANGIOGRAPHY
Anesthesia: Moderate Sedation

## 2020-07-15 MED ORDER — HYDRALAZINE HCL 20 MG/ML IJ SOLN: 10.0000 mg | INTRAMUSCULAR | Status: AC | PRN

## 2020-07-15 MED ORDER — SODIUM CHLORIDE 0.9 % WEIGHT BASED INFUSION
1.0000 mL/kg/h | INTRAVENOUS | Status: AC
  Administered 2020-07-15: 1 mL/kg/h via INTRAVENOUS

## 2020-07-15 MED ORDER — MIDAZOLAM HCL 2 MG/2ML IJ SOLN
INTRAMUSCULAR | Status: DC | PRN
  Administered 2020-07-15: 1 mg via INTRAVENOUS

## 2020-07-15 MED ORDER — HEPARIN (PORCINE) IN NACL 1000-0.9 UT/500ML-% IV SOLN
INTRAVENOUS | Status: AC
  Filled 2020-07-15: qty 1000

## 2020-07-15 MED ORDER — ASPIRIN 81 MG PO CHEW: 81.0000 mg | CHEWABLE_TABLET | ORAL | Status: DC

## 2020-07-15 MED ORDER — FENTANYL CITRATE (PF) 100 MCG/2ML IJ SOLN
INTRAMUSCULAR | Status: AC
  Filled 2020-07-15: qty 2

## 2020-07-15 MED ORDER — FENTANYL CITRATE (PF) 100 MCG/2ML IJ SOLN
INTRAMUSCULAR | Status: DC | PRN
Start: 2020-07-15 — End: 2020-07-15
  Administered 2020-07-15: 50 ug via INTRAVENOUS

## 2020-07-15 MED ORDER — IOHEXOL 300 MG/ML  SOLN
INTRAMUSCULAR | Status: DC | PRN
  Administered 2020-07-15: 90 mL

## 2020-07-15 MED ORDER — SODIUM CHLORIDE 0.9% FLUSH: 3.0000 mL | INTRAVENOUS | Status: DC | PRN

## 2020-07-15 MED ORDER — HEPARIN (PORCINE) IN NACL 1000-0.9 UT/500ML-% IV SOLN
INTRAVENOUS | Status: DC | PRN
  Administered 2020-07-15: 500 mL

## 2020-07-15 MED ORDER — SODIUM CHLORIDE 0.9 % WEIGHT BASED INFUSION: 1.0000 mL/kg/h | INTRAVENOUS | Status: DC

## 2020-07-15 MED ORDER — SODIUM CHLORIDE 0.9 % IV SOLN: 250.0000 mL | INTRAVENOUS | Status: DC | PRN

## 2020-07-15 MED ORDER — ACETAMINOPHEN 325 MG PO TABS
650.0000 mg | ORAL_TABLET | ORAL | Status: DC | PRN
  Filled 2020-07-15: qty 2

## 2020-07-15 MED ORDER — ONDANSETRON HCL 4 MG/2ML IJ SOLN: 4.0000 mg | Freq: Four times a day (QID) | INTRAMUSCULAR | Status: DC | PRN

## 2020-07-15 MED ORDER — LABETALOL HCL 5 MG/ML IV SOLN: 10.0000 mg | INTRAVENOUS | Status: AC | PRN

## 2020-07-15 MED ORDER — SODIUM CHLORIDE 0.9 % WEIGHT BASED INFUSION: 3.0000 mL/kg/h | INTRAVENOUS | Status: DC

## 2020-07-15 MED ORDER — MIDAZOLAM HCL 2 MG/2ML IJ SOLN
INTRAMUSCULAR | Status: AC
  Filled 2020-07-15: qty 2

## 2020-07-15 SURGICAL SUPPLY — 10 items
CATH INFINITI 5FR ANG PIGTAIL (CATHETERS) ×2 IMPLANT
CATH INFINITI 5FR JL4 (CATHETERS) ×2 IMPLANT
CATH INFINITI JR4 5F (CATHETERS) ×2 IMPLANT
DEVICE CLOSURE MYNXGRIP 5F (Vascular Products) ×2 IMPLANT
KIT MANI 3VAL PERCEP (MISCELLANEOUS) ×3 IMPLANT
NDL PERC 18GX7CM (NEEDLE) IMPLANT
NEEDLE PERC 18GX7CM (NEEDLE) ×3 IMPLANT
PACK CARDIAC CATH (CUSTOM PROCEDURE TRAY) ×3 IMPLANT
SHEATH AVANTI 5FR X 11CM (SHEATH) ×2 IMPLANT
WIRE GUIDERIGHT .035X150 (WIRE) ×2 IMPLANT

## 2020-07-15 NOTE — Progress Notes (Signed)
[facility] Encounter Note  Patient: John Hanna / Admit Date: 07/12/2020 / Date of Encounter: 07/15/2020, 1:48 PM   Subjective: Patient patient admitted for apparent sick sinus syndrome with syncope and atrial fibrillation needing pacemaker placement and other medical management.  Amiodarone drip was given with control of heart rate and maintenance of normal sinus rhythm with no issues.  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  This may be from right ventricular origin and or could possibly be accessory pathway although baseline EKG shows no evidence of short PR interval or delta wave.  There has been no evidence of atrial fibrillation.  With these rhythm disturbances the patient has not had any congestive heart failure and/or anginal symptoms or EKG changes consistent with ischemia.  Previous evaluation with cardiac MRI shows some mild changes in the inferior wall possibly ischemic and or infarct of unknown etiology but the patient does not have any significant risk factors for coronary artery disease.    Cardiac catheterization showing normal LV systolic function with ejection fraction of 60% with no evidence of wall motion abnormalities or valvular heart disease changes.  There is minimal left anterior descending artery atherosclerosis but no other significant coronary disease seen Cardiac MRI has suggested there may be some inferior wall scarring of unknown etiology which may be the origin of rhythm disturbances which need further evaluation and treatment options by electrophysiologic study  Review of Systems: Positive for: None Negative for: Vision change, hearing change, syncope, dizziness, nausea, vomiting,diarrhea, bloody  stool, stomach pain, cough, congestion, diaphoresis, urinary frequency, urinary pain,skin lesions, skin rashes Others previously listed  Objective: Telemetry: Supraventricular tachycardia and ventricular tachycardia Physical Exam: Blood pressure 132/83, pulse (!) 57, temperature 98.2 F (36.8 C), temperature source Oral, resp. rate 19, height 6\' 2"  (1.88 m), weight 101.5 kg, SpO2 100 %. Body mass index is 28.73 kg/m. General: Well developed, well nourished, in no acute distress. Head: Normocephalic, atraumatic, sclera non-icteric, no xanthomas, nares are without discharge. Neck: No apparent masses Lungs: Normal respirations with no wheezes, no rhonchi, no rales , no crackles   Heart: Regular rate and rhythm, normal S1 S2, no murmur, no rub, no gallop, PMI is normal size and placement, carotid upstroke normal without bruit, jugular venous pressure normal Abdomen: Soft, non-tender, non-distended with normoactive bowel sounds. No hepatosplenomegaly. Abdominal aorta is normal size without bruit Extremities: No edema, no clubbing, no cyanosis, no ulcers,  Peripheral: 2+ radial, 2+ femoral, 2+ dorsal pedal pulses Neuro: Alert and oriented. Moves all extremities spontaneously. Psych:  Responds to questions appropriately with a normal affect.   Intake/Output Summary (Last 24 hours) at 07/15/2020 1348 Last data filed at 07/15/2020 1100 Gross per 24 hour  Intake 2976.86 ml  Output 2710 ml  Net 266.86 ml    Inpatient Medications:  . [START ON 07/16/2020] aspirin  81 mg Oral Pre-Cath  . [MAR Hold] Chlorhexidine Gluconate Cloth  6 each Topical Daily  . [MAR Hold] enoxaparin (LOVENOX) injection  40 mg Subcutaneous Q24H  . [MAR Hold] fluticasone  1 spray Each Nare Daily  . [MAR Hold] irbesartan  300 mg Oral Daily   Or  . [MAR Hold] hydrochlorothiazide  25 mg Oral Daily  . [MAR Hold] metoprolol tartrate  25 mg Oral BID  . [MAR Hold] potassium chloride  10  mEq Oral Daily  . [MAR Hold] sodium  chloride flush  3 mL Intravenous Q12H  . [MAR Hold] tamsulosin  0.4 mg Oral q1800   Infusions:  . sodium chloride    . [START ON 07/16/2020] sodium chloride     Followed by  . [START ON 07/16/2020] sodium chloride    . amiodarone 30 mg/hr (07/15/20 1100)  . [MAR Hold] cefTRIAXone (ROCEPHIN)  IV Stopped (07/14/20 2110)    Labs: Recent Labs    07/12/20 2316 07/13/20 0226 07/13/20 0341 07/14/20 0440  NA   < >  --  135 139  K   < >  --  3.6 3.5  CL   < >  --  101 102  CO2   < >  --  25 28  GLUCOSE   < >  --  145* 121*  BUN   < >  --  11 10  CREATININE   < >  --  1.22 1.20  CALCIUM   < >  --  8.8* 8.7*  MG  --  2.0  --  2.1   < > = values in this interval not displayed.   Recent Labs    07/12/20 2316  AST 15  ALT 20  ALKPHOS 47  BILITOT 0.9  PROT 7.6  ALBUMIN 4.0   Recent Labs    07/12/20 2316 07/12/20 2316 07/13/20 0341 07/14/20 0440  WBC 18.8*   < > 16.1* 13.0*  NEUTROABS 14.1*  --   --   --   HGB 14.0   < > 12.7* 12.0*  HCT 40.9   < > 38.1* 36.3*  MCV 88.5   < > 90.1 91.2  PLT 270   < > 236 224   < > = values in this interval not displayed.   No results for input(s): CKTOTAL, CKMB, TROPONINI in the last 72 hours. Invalid input(s): POCBNP No results for input(s): HGBA1C in the last 72 hours.   Weights: Filed Weights   07/12/20 2313  Weight: 101.5 kg     Radiology/Studies:  DG Chest 2 View  Result Date: 06/19/2020 CLINICAL DATA:  Chest pain.  History of atrial fibrillation. EXAM: CHEST - 2 VIEW COMPARISON:  09/28/2019 FINDINGS: Midline trachea. Normal heart size. No pleural effusion or pneumothorax. Subsegmental atelectasis or interval scarring in the left lung base laterally. No lobar consolidation. Mild biapical pleuroparenchymal scarring. IMPRESSION: No acute cardiopulmonary disease. Electronically Signed   By: Abigail Miyamoto M.D.   On: 06/19/2020 16:15   DG Chest Portable 1 View  Result Date: 07/12/2020 CLINICAL DATA:  Atrial fibrillation EXAM:  PORTABLE CHEST 1 VIEW COMPARISON:  06/19/2020 FINDINGS: The heart size and mediastinal contours are within normal limits. Both lungs are clear. The visualized skeletal structures are unremarkable. IMPRESSION: No active disease. Electronically Signed   By: Donavan Foil M.D.   On: 07/12/2020 23:31     Assessment and Recommendation  59 y.o. male with known apparent paroxysmal nonvalvular atrial fibrillation with episodes of syncope presyncope now with other rhythm disturbances revealed yesterday likely the primary cause of syncope presyncope including supraventricular tachycardia possibly consistent with AV node reentrant tachycardia and or of ventricular tachycardia with a left bundle branch pattern now currently controlled with medication management including beta-blocker and amiodarone  With cardiac catheterization showing normal LV systolic function no evidence of valvular heart disease and no significant coronary artery atherosclerosis causing above issues MRI with possible scarring in the inferior wall may be some of the  origin of issues are requiring further intervention including electrophysiologic study.   1.  We will continue amiodarone and beta-blocker which have been ordered for this current issue and stabilized with amiodarone drip maintenance and oral beta-blocker 25 mg twice per day 2.  No further coronary intervention at this time due to no evidence of significant coronary atherosclerosis causing above 3.  Transfer to tertiary care for further evaluation of supraventricular tachycardia and ventricular tachycardia with left bundle branch pattern and electrophysiology study  Signed, Serafina Royals M.D. FACC

## 2020-07-15 NOTE — Progress Notes (Signed)
1       PROGRESS NOTE    John Hanna  WER:[id number] DOB: 1961-04-26 DOA: 07/12/2020 PCP: [name], MD   Brief Narrative: Per dictated history and physical.  John Hanna  is a 59 y.o. Caucasian male with a known history of osteoarthritis, atrial fibrillation and hypertension, who presented to the emergency room with acute onset of palpitations with chest pain and fluttering, near syncope with lightheadedness and syncope while he was riding with his wife in the car.  He admitted to associated dyspnea without cough or wheezing.  He was noted to be in paroxysmal atrial fibrillation with rapid ventricular response.  He was expected to have a pacemaker placement next month.  He has been taking his Lopressor regularly.   Upon presentation to the emergency room, heart rate of 46 and later on 100 then 124, respiratory rate was normal and later was 22 then 17.  Labs revealed borderline potassium of 3.5 and otherwise unremarkable CMP BNP was 123.8 and high-sensitivity troponin I was 9.  CBC showed symptoms of 18.8 with neutrophilia respiratory panel is currently pending.  Portable chest ray showed no acute cardiopulmonary disease. Initial EKG showed atrial fibrillation with rapid ventricle response of 132 with teasing peers repeat EKG showed rate of 117 and a repeat EKG showed SVT with a rate of 197.   He was given 5 mg of IV Lopressor. Marland KitchenHe continued to be in atrial fibrillation his rate was up to 150.  He was started on on IV amiodarone with bolus and drip.   Assessment & Plan:   Principal Problem:   Paroxysmal atrial fibrillation with RVR (HCC) Active Problems:   Essential (primary) hypertension   BPH associated with nocturia   OSA on CPAP   Acute urinary retention  Paroxysmal A. fib with RVR and intermittent SVT/NSVT -Controlled by medications including beta-blocker and amiodarone -Waiting for transfer to [facility] for electrophysiology study and pacemaker placement  Acute urinary  retention Continue Foley catheter and daily Flomax Appreciate urology input.  Will need outpatient voiding trial in 7 to 10 days with Dr. Cherrie Gauze office Await urine culture Continue empiric Rocephin and Diflucan  Essential hypertension Continue Avapro and HCTZ  Coronary artery disease Status post cardiac cath today on 9/30 was showing normal LV systolic function with no significant coronary artery atherosclerosis Cardiac MRI showing scarring in the inferior wall   DVT prophylaxis:  enoxaparin (LOVENOX) injection 40 mg Start: 07/13/20 1000     Code Status: Full Code Family Communication: Wife at bedside  disposition Plan: Await transfer to [place].  Remains inpatient appropriate because:Inpatient level of care appropriate due to severity of illness   Dispo: The patient is from: Home              Anticipated d/c is to: Home              Anticipated d/c date is: 2 days              Patient currently is not medically stable to d/c.   Consultants:   Cardiology  Procedures: Foley remains indwelling -s/p cath on 9/30     Antimicrobials: IV Rocephin started 9/28   Subjective: Feeling much better while in the ICU. Denies any symptoms at this time. Wife at bedside.  Objective: Vitals:   07/15/20 1700 07/15/20 1800 07/15/20 1900 07/15/20 2000  BP: 129/77 (!) 102/51 124/75 125/73  Pulse:    64  Resp: (!) 23 (!) 22 (!) 21 19  Temp:  98 F (36.7 C)  TempSrc:    Oral  SpO2:    95%  Weight:      Height:        Intake/Output Summary (Last 24 hours) at 07/15/2020 2142 Last data filed at 07/15/2020 1900 Gross per 24 hour  Intake 1055.92 ml  Output 1850 ml  Net -794.08 ml   Filed Weights   07/12/20 2313  Weight: 101.5 kg    Examination:  General exam: Appears calm and comfortable  Respiratory system: Clear to auscultation. Respiratory effort normal. Cardiovascular system: S1 & S2 heard, RRR. No JVD, murmurs, rubs, gallops or clicks. No pedal  edema. Gastrointestinal system: Abdomen is nondistended, soft and nontender. No organomegaly or masses felt. Normal bowel sounds heard. Central nervous system: Alert and oriented. No focal neurological deficits. Extremities: Symmetric 5 x 5 power. Skin: No rashes, lesions or ulcers Psychiatry: Judgement and insight appear normal. Mood & affect appropriate.     Data Reviewed: I have personally reviewed following labs and imaging studies  CBC: Recent Labs  Lab 07/12/20 2316 07/13/20 0341 07/14/20 0440  WBC 18.8* 16.1* 13.0*  NEUTROABS 14.1*  --   --   HGB 14.0 12.7* 12.0*  HCT 40.9 38.1* 36.3*  MCV 88.5 90.1 91.2  PLT 270 236 947   Basic Metabolic Panel: Recent Labs  Lab 07/12/20 2316 07/13/20 0226 07/13/20 0341 07/14/20 0440  NA 136  --  135 139  K 3.5  --  3.6 3.5  CL 98  --  101 102  CO2 26  --  25 28  GLUCOSE 126*  --  145* 121*  BUN 12  --  11 10  CREATININE 1.22  --  1.22 1.20  CALCIUM 9.1  --  8.8* 8.7*  MG  --  2.0  --  2.1   GFR: Estimated Creatinine Clearance: 87.4 mL/min (by C-G formula based on SCr of 1.2 mg/dL). Liver Function Tests: Recent Labs  Lab 07/12/20 2316  AST 15  ALT 20  ALKPHOS 47  BILITOT 0.9  PROT 7.6  ALBUMIN 4.0   No results for input(s): LIPASE, AMYLASE in the last 168 hours. No results for input(s): AMMONIA in the last 168 hours. Coagulation Profile: No results for input(s): INR, PROTIME in the last 168 hours. Cardiac Enzymes: No results for input(s): CKTOTAL, CKMB, CKMBINDEX, TROPONINI in the last 168 hours. BNP (last 3 results) No results for input(s): PROBNP in the last 8760 hours. HbA1C: No results for input(s): HGBA1C in the last 72 hours. CBG: No results for input(s): GLUCAP in the last 168 hours. Lipid Profile: Recent Labs    07/13/20 0341  CHOL 134  HDL 48  LDLCALC 74  TRIG 61  CHOLHDL 2.8   Thyroid Function Tests: Recent Labs    07/13/20 0226  TSH 1.655  FREET4 1.14*   Anemia Panel: No results for  input(s): VITAMINB12, FOLATE, FERRITIN, TIBC, IRON, RETICCTPCT in the last 72 hours. Sepsis Labs: No results for input(s): PROCALCITON, LATICACIDVEN in the last 168 hours.  Recent Results (from the past 240 hour(s))  Respiratory Panel by RT PCR (Flu A&B, Covid) - Nasopharyngeal Swab     Status: None   Collection Time: 07/13/20  2:26 AM   Specimen: Nasopharyngeal Swab  Result Value Ref Range Status   SARS Coronavirus 2 by RT PCR NEGATIVE NEGATIVE Final    Comment: (NOTE) SARS-CoV-2 target nucleic acids are NOT DETECTED.  The SARS-CoV-2 RNA is generally detectable in upper respiratoy specimens during the acute  phase of infection. The lowest concentration of SARS-CoV-2 viral copies this assay can detect is 131 copies/mL. A negative result does not preclude SARS-Cov-2 infection and should not be used as the sole basis for treatment or other patient management decisions. A negative result may occur with  improper specimen collection/handling, submission of specimen other than nasopharyngeal swab, presence of viral mutation(s) within the areas targeted by this assay, and inadequate number of viral copies (<131 copies/mL). A negative result must be combined with clinical observations, patient history, and epidemiological information. The expected result is Negative.  Fact Sheet for Patients:  PinkCheek.be  Fact Sheet for Healthcare Providers:  GravelBags.it  This test is no t yet approved or cleared by the [place] FDA and  has been authorized for detection and/or diagnosis of SARS-CoV-2 by FDA under an Emergency Use Authorization (EUA). This EUA will remain  in effect (meaning this test can be used) for the duration of the COVID-19 declaration under Section 564(b)(1) of the Act, 21 U.S.C. section 360bbb-3(b)(1), unless the authorization is terminated or revoked sooner.     Influenza A by PCR NEGATIVE NEGATIVE Final    Influenza B by PCR NEGATIVE NEGATIVE Final    Comment: (NOTE) The Xpert Xpress SARS-CoV-2/FLU/RSV assay is intended as an aid in  the diagnosis of influenza from Nasopharyngeal swab specimens and  should not be used as a sole basis for treatment. Nasal washings and  aspirates are unacceptable for Xpert Xpress SARS-CoV-2/FLU/RSV  testing.  Fact Sheet for Patients: PinkCheek.be  Fact Sheet for Healthcare Providers: GravelBags.it  This test is not yet approved or cleared by the [place] FDA and  has been authorized for detection and/or diagnosis of SARS-CoV-2 by  FDA under an Emergency Use Authorization (EUA). This EUA will remain  in effect (meaning this test can be used) for the duration of the  Covid-19 declaration under Section 564(b)(1) of the Act, 21  U.S.C. section 360bbb-3(b)(1), unless the authorization is  terminated or revoked. Performed at [facility], [address] [place], [place] [address]   MRSA PCR Screening     Status: None   Collection Time: 07/14/20  4:48 PM   Specimen: Nasopharyngeal  Result Value Ref Range Status   MRSA by PCR NEGATIVE NEGATIVE Final    Comment:        The GeneXpert MRSA Assay (FDA approved for NASAL specimens only), is one component of a comprehensive MRSA colonization surveillance program. It is not intended to diagnose MRSA infection nor to guide or monitor treatment for MRSA infections. Performed at [facility], [address], [place], [place] [address]          Radiology Studies: CARDIAC CATHETERIZATION  Result Date: 07/15/2020  Ost LAD to Prox LAD lesion is 20% stenosed.  Prox LAD lesion is 15% stenosed.  Mid LM lesion is 20% stenosed.  59 year old male with minimal risk factors for cardiovascular disease having paroxysmal nonvalvular atrial fibrillation and episode of syncope for which the patient was scheduled for pacemaker placement  due to sick sinus syndrome.  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  He did not have myocardial infarction or congestive heart failure.  Previous cardiac MRI suggested some possible lower right or inferior wall changes and/or scarring Left ventricle normal LV systolic function ejection fraction of 60% Minimal atherosclerosis of  the distal left main LAD without evidence of significant stenoses Assessment Supraventricular tachycardia atrial fibrillation and left bundle branch block pattern ventricular tachycardia with syncope of unknown etiology without evidence of congestive heart failure or myocardial infarction or significant coronary atherosclerosis Plan Continue amiodarone drip with supplemental beta-blocker until further evaluation by electrophysiologic study Low-dose antiplatelet medication management after above for minimal coronary atherosclerosis We will consider high intensity cholesterol therapy as outpatient        Scheduled Meds: . Chlorhexidine Gluconate Cloth  6 each Topical Daily  . enoxaparin (LOVENOX) injection  40 mg Subcutaneous Q24H  . fluticasone  1 spray Each Nare Daily  . irbesartan  300 mg Oral Daily   Or  . hydrochlorothiazide  25 mg Oral Daily  . metoprolol tartrate  25 mg Oral BID  . potassium chloride  10 mEq Oral Daily  . sodium chloride flush  3 mL Intravenous Q12H  . tamsulosin  0.4 mg Oral q1800   Continuous Infusions: . sodium chloride 1 mL/kg/hr (07/15/20 1638)  . amiodarone 30 mg/hr (07/15/20 1800)  . cefTRIAXone (ROCEPHIN)  IV 200 mL/hr at 07/15/20 1900     LOS: 2 days    Time spent: 35 minutes    Max Sane, MD [facility] Pager [phone]  If 7PM-7AM, please contact night-coverage [email or web address] Password  [facility] 07/15/2020, 9:42 PM

## 2020-07-15 NOTE — Progress Notes (Signed)
Pt transported to OR

## 2020-07-15 NOTE — Plan of Care (Signed)
Pt is progressing toward goal, clean cath today, wife updated, VSS.

## 2020-07-16 ENCOUNTER — Other Ambulatory Visit: Admission: RE | Admit: 2020-07-16 | Payer: BC Managed Care – PPO | Source: Ambulatory Visit

## 2020-07-16 LAB — URINALYSIS, COMPLETE (UACMP) WITH MICROSCOPIC
Bacteria, UA: NONE SEEN
Bilirubin Urine: NEGATIVE
Glucose, UA: NEGATIVE mg/dL
Ketones, ur: NEGATIVE mg/dL
Leukocytes,Ua: NEGATIVE
Nitrite: NEGATIVE
Protein, ur: NEGATIVE mg/dL
Specific Gravity, Urine: 1.012 (ref 1.005–1.030)
Squamous Epithelial / HPF: NONE SEEN (ref 0–5)
pH: 6 (ref 5.0–8.0)

## 2020-07-16 LAB — RESPIRATORY PANEL BY RT PCR (FLU A&B, COVID)
Influenza A by PCR: NEGATIVE
Influenza B by PCR: NEGATIVE
SARS Coronavirus 2 by RT PCR: NEGATIVE

## 2020-07-16 MED ORDER — POTASSIUM CHLORIDE CRYS ER 20 MEQ PO TBCR
20.0000 meq | EXTENDED_RELEASE_TABLET | Freq: Once | ORAL | Status: AC
  Administered 2020-07-16: 20 meq via ORAL
  Filled 2020-07-16: qty 1

## 2020-07-16 MED ORDER — SENNOSIDES-DOCUSATE SODIUM 8.6-50 MG PO TABS
2.0000 | ORAL_TABLET | Freq: Two times a day (BID) | ORAL | Status: DC
  Administered 2020-07-16 (×2): 2 via ORAL
  Filled 2020-07-16 (×3): qty 2

## 2020-07-16 NOTE — Discharge Summary (Signed)
[facility] [place] NAME: John Hanna    MR#:  [id number]  DATE OF BIRTH:  [birth year]  DATE OF ADMISSION:  07/12/2020   ADMITTING PHYSICIAN: John Mormon, MD  DATE OF DISCHARGE: 07/16/2020  PRIMARY CARE PHYSICIAN: John Hess, MD   ADMISSION DIAGNOSIS:  Paroxysmal atrial fibrillation with RVR ([place]) [I48.0] DISCHARGE DIAGNOSIS:  Principal Problem:   Paroxysmal atrial fibrillation with RVR ([place]) Active Problems:   Essential (primary) hypertension   BPH associated with nocturia   OSA on CPAP   Acute urinary retention  SECONDARY DIAGNOSIS:   Past Medical History:  Diagnosis Date  . Arthritis   . Atrial fibrillation ([place])   . Hypertension    HOSPITAL COURSE:  John Hanna a59 y.o.Caucasian malewith a known history of osteoarthritis, atrial fibrillation and hypertension admitted with acute onset of palpitationswith chest pain and fluttering,near syncope with lightheadednessand syncopewhile he was riding with his wife in the car. Hewas noted to be in paroxysmal atrial fibrillation with rapid ventricular response. He was expected to have a pacemaker placement next month. He has been taking his Lopressor regularly.   Upon presentation to the emergency room, heart rate of 46 and later on 100 then 124,respiratory rate was normal and later was 22 then 17. Labs revealed borderline potassium of 3.5 and otherwise unremarkable CMP BNP was 123.8 and high-sensitivity troponin I was 9.CBC showed symptoms of 18.8 with neutrophilia respiratory panel is currently pending. Portable chest ray showed no acute cardiopulmonary disease.Initial EKG showed atrial fibrillation with rapid ventricle response of 132 with teasing peers repeat EKG showed rate of 117 and a repeat EKG showed SVT with a rate of 197.  He was given 5 mg of IV Lopressor.Marland KitchenHe continued to be in atrial fibrillation his rate was up to 150.  He was started on on IV  amiodarone with bolus and drip.   Paroxysmal A. fib with RVR and intermittent SVT/NSVT -Controlled by medications including beta-blocker and amiodarone -Waiting for transfer to [facility] for electrophysiology study and potentially pacemaker placement  Acute urinary retention Continue Foley catheter and daily Flomax Appreciate urology input.  Will need outpatient voiding trial in 7 to 10 days with Dr. Cherrie Gauze office Continue empiric Rocephin.  Completed Diflucan  Essential hypertension Continue Avapro and HCTZ  Coronary artery disease Status post cardiac cath today on 9/30 was showing normal LV systolic function with no significant coronary artery atherosclerosis Cardiac MRI showing scarring in the inferior wall    DISCHARGE CONDITIONS:  fair CONSULTS OBTAINED:  Treatment Team:  Billey Co, MD DRUG ALLERGIES:   Allergies  Allergen Reactions  . Cyclobenzaprine Other (See Comments)    Acute urinary retention   DISCHARGE MEDICATIONS:   Allergies as of 07/16/2020      Reactions   Cyclobenzaprine Other (See Comments)   Acute urinary retention      Medication List    TAKE these medications   aspirin EC 81 MG tablet Take 81 mg by mouth daily.   fluticasone 50 MCG/ACT nasal spray Commonly known as: FLONASE Place 1 spray into both nostrils daily.   metoprolol succinate 50 MG 24 hr tablet Commonly known as: TOPROL-XL Take 75 mg by mouth daily.   NON FORMULARY CPAP Nightly.   potassium chloride 10 MEQ tablet Commonly known as: KLOR-CON Take 10 mEq by mouth daily.   tamsulosin 0.4 MG Caps capsule Commonly known as: FLOMAX Take 1 capsule (0.4 mg total) by mouth  daily. What changed: when to take this   valsartan-hydrochlorothiazide 320-25 MG tablet Commonly known as: DIOVAN-HCT Take 1 tablet by mouth once daily      DISCHARGE INSTRUCTIONS:   DIET:  Cardiac diet DISCHARGE CONDITION:  Stable ACTIVITY:  Activity as tolerated OXYGEN:  Home Oxygen:  No.  Oxygen Delivery: room air DISCHARGE LOCATION:  [facility]  If you experience worsening of your admission symptoms, develop shortness of breath, life threatening emergency, suicidal or homicidal thoughts you must seek medical attention immediately by calling 911 or calling your MD immediately  if symptoms less severe.  You Must read complete instructions/literature along with all the possible adverse reactions/side effects for all the Medicines you take and that have been prescribed to you. Take any new Medicines after you have completely understood and accpet all the possible adverse reactions/side effects.   Please note  You were cared for by a hospitalist during your hospital stay. If you have any questions about your discharge medications or the care you received while you were in the hospital after you are discharged, you can call the unit and asked to speak with the hospitalist on call if the hospitalist that took care of you is not available. Once you are discharged, your primary care physician will handle any further medical issues. Please note that NO REFILLS for any discharge medications will be authorized once you are discharged, as it is imperative that you return to your primary care physician (or establish a relationship with a primary care physician if you do not have one) for your aftercare needs so that they can reassess your need for medications and monitor your lab values.    On the day of Discharge:  VITAL SIGNS:  Blood pressure 130/78, pulse 62, temperature 99.8 F (37.7 C), temperature source Oral, resp. rate 17, height 6\' 2"  (1.88 m), weight 101.5 kg, SpO2 98 %. PHYSICAL EXAMINATION:  GENERAL:  59 y.o.-year-old patient lying in the bed with no acute distress.  EYES: Pupils equal, round, reactive to light and accommodation. No scleral icterus. Extraocular muscles intact.  HEENT: Head atraumatic, normocephalic. Oropharynx and nasopharynx clear.    NECK:  Supple, no jugular venous distention. No thyroid enlargement, no tenderness.  LUNGS: Normal breath sounds bilaterally, no wheezing, rales,rhonchi or crepitation. No use of accessory muscles of respiration.  CARDIOVASCULAR: S1, S2 normal. No murmurs, rubs, or gallops.  ABDOMEN: Soft, non-tender, non-distended. Bowel sounds present. No organomegaly or mass.  EXTREMITIES: No pedal edema, cyanosis, or clubbing.  NEUROLOGIC: Cranial nerves II through XII are intact. Muscle strength 5/5 in all extremities. Sensation intact. Gait not checked.  PSYCHIATRIC: The patient is alert and oriented x 3.  SKIN: No obvious rash, lesion, or ulcer.  DATA REVIEW:   CBC Recent Labs  Lab 07/14/20 0440  WBC 13.0*  HGB 12.0*  HCT 36.3*  PLT 224    Chemistries  Recent Labs  Lab 07/12/20 2316 07/13/20 0226 07/14/20 0440 07/14/20 0440 07/15/20 2137  NA 136   < > 139   < > 139  K 3.5   < > 3.5   < > 3.7  CL 98   < > 102   < > 100  CO2 26   < > 28   < > 27  GLUCOSE 126*   < > 121*   < > 114*  BUN 12   < > 10   < > 11  CREATININE 1.22   < > 1.20   < >  1.15  CALCIUM 9.1   < > 8.7*   < > 8.6*  MG  --    < > 2.1  --   --   AST 15  --   --   --   --   ALT 20  --   --   --   --   ALKPHOS 47  --   --   --   --   BILITOT 0.9  --   --   --   --    < > = values in this interval not displayed.     Outpatient follow-up    Management plans discussed with the patient, family and they are in agreement.  CODE STATUS: Full Code   TOTAL TIME TAKING CARE OF THIS PATIENT: 45 minutes.    Max Sane M.D on 07/16/2020 at 6:06 PM  [facility]   CC: Primary care physician; John Hess, MD   Note: This dictation was prepared with Dragon dictation along with smaller phrase technology. Any transcriptional errors that result from this process are unintentional.

## 2020-07-16 NOTE — Progress Notes (Signed)
1       PROGRESS NOTE    John Hanna  GLO:[id number] DOB: 03-10-1961 DOA: 07/12/2020 PCP: [name], MD   Brief Narrative: Per dictated history and physical.  John Hanna  is a 59 y.o. Caucasian male with a known history of osteoarthritis, atrial fibrillation and hypertension, who presented to the emergency room with acute onset of palpitations with chest pain and fluttering, near syncope with lightheadedness and syncope while he was riding with his wife in the car.  He admitted to associated dyspnea without cough or wheezing.  He was noted to be in paroxysmal atrial fibrillation with rapid ventricular response.  He was expected to have a pacemaker placement next month.  He has been taking his Lopressor regularly.   Upon presentation to the emergency room, heart rate of 46 and later on 100 then 124, respiratory rate was normal and later was 22 then 17.  Labs revealed borderline potassium of 3.5 and otherwise unremarkable CMP BNP was 123.8 and high-sensitivity troponin I was 9.  CBC showed symptoms of 18.8 with neutrophilia respiratory panel is currently pending.  Portable chest ray showed no acute cardiopulmonary disease. Initial EKG showed atrial fibrillation with rapid ventricle response of 132 with teasing peers repeat EKG showed rate of 117 and a repeat EKG showed SVT with a rate of 197.   He was given 5 mg of IV Lopressor. Marland KitchenHe continued to be in atrial fibrillation his rate was up to 150.  He was started on on IV amiodarone with bolus and drip.   Assessment & Plan:   Principal Problem:   Paroxysmal atrial fibrillation with RVR (HCC) Active Problems:   Essential (primary) hypertension   BPH associated with nocturia   OSA on CPAP   Acute urinary retention  Paroxysmal A. fib with RVR and intermittent SVT/NSVT -Controlled by medications including beta-blocker and amiodarone -Waiting for transfer to [facility] for electrophysiology study and potentially pacemaker  placement  Acute urinary retention Continue Foley catheter and daily Flomax Appreciate urology input.  Will need outpatient voiding trial in 7 to 10 days with Dr. Cherrie Gauze office Await urine culture Continue empiric Rocephin.  Completed Diflucan  Essential hypertension Continue Avapro and HCTZ  Coronary artery disease Status post cardiac cath today on 9/30 was showing normal LV systolic function with no significant coronary artery atherosclerosis Cardiac MRI showing scarring in the inferior wall   DVT prophylaxis:  enoxaparin (LOVENOX) injection 40 mg Start: 07/13/20 1000     Code Status: Full Code Family Communication: Wife at bedside  disposition Plan: Await transfer to [place].  Remains inpatient appropriate because:Inpatient level of care appropriate due to severity of illness   Dispo: The patient is from: Home              Anticipated d/c is to: Home              Anticipated d/c date is: 2 days              Patient currently is not medically stable to d/c.   Consultants:   Cardiology  Procedures: Foley remains indwelling -s/p cath on 9/30     Antimicrobials: IV Rocephin started 9/28   Subjective: Could not sleep very well last night, feels tired.  Waiting for transfer to [facility].  Wife at bedside  Objective: Vitals:   07/16/20 0800 07/16/20 0900 07/16/20 1115 07/16/20 1200  BP: 139/82 127/70 138/79 (!) 142/80  Pulse: (!) 59 65 72 71  Resp: 17 12  18  Temp:    98.1 F (36.7 C)  TempSrc:    Oral  SpO2: 97% 95%  94%  Weight:      Height:        Intake/Output Summary (Last 24 hours) at 07/16/2020 1440 Last data filed at 07/16/2020 1426 Gross per 24 hour  Intake 972.91 ml  Output 2050 ml  Net -1077.09 ml   Filed Weights   07/12/20 2313  Weight: 101.5 kg    Examination:  General exam: Appears calm and comfortable  Respiratory system: Clear to auscultation. Respiratory effort normal. Cardiovascular system: S1 & S2 heard, RRR. No JVD, murmurs, rubs,  gallops or clicks. No pedal edema. Gastrointestinal system: Abdomen is nondistended, soft and nontender. No organomegaly or masses felt. Normal bowel sounds heard. Central nervous system: Alert and oriented. No focal neurological deficits. Extremities: Symmetric 5 x 5 power. Skin: No rashes, lesions or ulcers Psychiatry: Judgement and insight appear normal. Mood & affect appropriate.     Data Reviewed: I have personally reviewed following labs and imaging studies  CBC: Recent Labs  Lab 07/12/20 2316 07/13/20 0341 07/14/20 0440  WBC 18.8* 16.1* 13.0*  NEUTROABS 14.1*  --   --   HGB 14.0 12.7* 12.0*  HCT 40.9 38.1* 36.3*  MCV 88.5 90.1 91.2  PLT 270 236 379   Basic Metabolic Panel: Recent Labs  Lab 07/12/20 2316 07/13/20 0226 07/13/20 0341 07/14/20 0440 07/15/20 2137  NA 136  --  135 139 139  K 3.5  --  3.6 3.5 3.7  CL 98  --  101 102 100  CO2 26  --  25 28 27   GLUCOSE 126*  --  145* 121* 114*  BUN 12  --  11 10 11   CREATININE 1.22  --  1.22 1.20 1.15  CALCIUM 9.1  --  8.8* 8.7* 8.6*  MG  --  2.0  --  2.1  --    GFR: Estimated Creatinine Clearance: 91.2 mL/min (by C-G formula based on SCr of 1.15 mg/dL). Liver Function Tests: Recent Labs  Lab 07/12/20 2316  AST 15  ALT 20  ALKPHOS 47  BILITOT 0.9  PROT 7.6  ALBUMIN 4.0   No results for input(s): LIPASE, AMYLASE in the last 168 hours. No results for input(s): AMMONIA in the last 168 hours. Coagulation Profile: No results for input(s): INR, PROTIME in the last 168 hours. Cardiac Enzymes: No results for input(s): CKTOTAL, CKMB, CKMBINDEX, TROPONINI in the last 168 hours. BNP (last 3 results) No results for input(s): PROBNP in the last 8760 hours. HbA1C: No results for input(s): HGBA1C in the last 72 hours. CBG: No results for input(s): GLUCAP in the last 168 hours. Lipid Profile: No results for input(s): CHOL, HDL, LDLCALC, TRIG, CHOLHDL, LDLDIRECT in the last 72 hours. Thyroid Function Tests: No  results for input(s): TSH, T4TOTAL, FREET4, T3FREE, THYROIDAB in the last 72 hours. Anemia Panel: No results for input(s): VITAMINB12, FOLATE, FERRITIN, TIBC, IRON, RETICCTPCT in the last 72 hours. Sepsis Labs: No results for input(s): PROCALCITON, LATICACIDVEN in the last 168 hours.  Recent Results (from the past 240 hour(s))  Respiratory Panel by RT PCR (Flu A&B, Covid) - Nasopharyngeal Swab     Status: None   Collection Time: 07/13/20  2:26 AM   Specimen: Nasopharyngeal Swab  Result Value Ref Range Status   SARS Coronavirus 2 by RT PCR NEGATIVE NEGATIVE Final    Comment: (NOTE) SARS-CoV-2 target nucleic acids are NOT DETECTED.  The SARS-CoV-2 RNA is  generally detectable in upper respiratoy specimens during the acute phase of infection. The lowest concentration of SARS-CoV-2 viral copies this assay can detect is 131 copies/mL. A negative result does not preclude SARS-Cov-2 infection and should not be used as the sole basis for treatment or other patient management decisions. A negative result may occur with  improper specimen collection/handling, submission of specimen other than nasopharyngeal swab, presence of viral mutation(s) within the areas targeted by this assay, and inadequate number of viral copies (<131 copies/mL). A negative result must be combined with clinical observations, patient history, and epidemiological information. The expected result is Negative.  Fact Sheet for Patients:  PinkCheek.be  Fact Sheet for Healthcare Providers:  GravelBags.it  This test is no t yet approved or cleared by the [place] FDA and  has been authorized for detection and/or diagnosis of SARS-CoV-2 by FDA under an Emergency Use Authorization (EUA). This EUA will remain  in effect (meaning this test can be used) for the duration of the COVID-19 declaration under Section 564(b)(1) of the Act, 21 U.S.C. section 360bbb-3(b)(1),  unless the authorization is terminated or revoked sooner.     Influenza A by PCR NEGATIVE NEGATIVE Final   Influenza B by PCR NEGATIVE NEGATIVE Final    Comment: (NOTE) The Xpert Xpress SARS-CoV-2/FLU/RSV assay is intended as an aid in  the diagnosis of influenza from Nasopharyngeal swab specimens and  should not be used as a sole basis for treatment. Nasal washings and  aspirates are unacceptable for Xpert Xpress SARS-CoV-2/FLU/RSV  testing.  Fact Sheet for Patients: PinkCheek.be  Fact Sheet for Healthcare Providers: GravelBags.it  This test is not yet approved or cleared by the [place] FDA and  has been authorized for detection and/or diagnosis of SARS-CoV-2 by  FDA under an Emergency Use Authorization (EUA). This EUA will remain  in effect (meaning this test can be used) for the duration of the  Covid-19 declaration under Section 564(b)(1) of the Act, 21  U.S.C. section 360bbb-3(b)(1), unless the authorization is  terminated or revoked. Performed at [facility], [address] [place], [place] [address]   MRSA PCR Screening     Status: None   Collection Time: 07/14/20  4:48 PM   Specimen: Nasopharyngeal  Result Value Ref Range Status   MRSA by PCR NEGATIVE NEGATIVE Final    Comment:        The GeneXpert MRSA Assay (FDA approved for NASAL specimens only), is one component of a comprehensive MRSA colonization surveillance program. It is not intended to diagnose MRSA infection nor to guide or monitor treatment for MRSA infections. Performed at [facility], [address] [place], [place] [address]          Radiology Studies: CARDIAC CATHETERIZATION  Result Date: 07/15/2020  Ost LAD to Prox LAD lesion is 20% stenosed.  Prox LAD lesion is 15% stenosed.  Mid LM lesion is 20% stenosed.  59 year old male with minimal risk factors for cardiovascular disease having paroxysmal  nonvalvular atrial fibrillation and episode of syncope for which the patient was scheduled for pacemaker placement due to sick sinus syndrome.  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  He did not have myocardial infarction or congestive heart failure.  Previous cardiac MRI suggested some possible lower right or inferior wall changes and/or scarring Left ventricle normal LV  systolic function ejection fraction of 60% Minimal atherosclerosis of the distal left main LAD without evidence of significant stenoses Assessment Supraventricular tachycardia atrial fibrillation and left bundle branch block pattern ventricular tachycardia with syncope of unknown etiology without evidence of congestive heart failure or myocardial infarction or significant coronary atherosclerosis Plan Continue amiodarone drip with supplemental beta-blocker until further evaluation by electrophysiologic study Low-dose antiplatelet medication management after above for minimal coronary atherosclerosis We will consider high intensity cholesterol therapy as outpatient        Scheduled Meds: . Chlorhexidine Gluconate Cloth  6 each Topical Daily  . enoxaparin (LOVENOX) injection  40 mg Subcutaneous Q24H  . fluticasone  1 spray Each Nare Daily  . irbesartan  300 mg Oral Daily   Or  . hydrochlorothiazide  25 mg Oral Daily  . metoprolol tartrate  25 mg Oral BID  . potassium chloride  10 mEq Oral Daily  . senna-docusate  2 tablet Oral BID  . sodium chloride flush  3 mL Intravenous Q12H  . tamsulosin  0.4 mg Oral q1800   Continuous Infusions: . amiodarone 30 mg/hr (07/16/20 1126)  . cefTRIAXone (ROCEPHIN)  IV Stopped (07/15/20 1905)     LOS: 3 days    Time spent: 35 minutes    Que Meneely Manuella Ghazi, MD [facility] Pager [phone]  If 7PM-7AM, please contact night-coverage [email or web address] Password [email or web address] 07/16/2020, 2:40 PM

## 2020-07-16 NOTE — Progress Notes (Signed)
[facility] Encounter Note  Patient: John Hanna / Admit Date: 07/12/2020 / Date of Encounter: 07/16/2020, 8:49 AM   Subjective: Patient patient admitted for apparent sick sinus syndrome with syncope and atrial fibrillation needing pacemaker placement and other medical management.  Amiodarone drip was given with control of heart rate and maintenance of normal sinus rhythm with no issues.  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  This may be from right ventricular origin and or could possibly be accessory pathway although baseline EKG shows no evidence of short PR interval or delta wave.  There has been no evidence of atrial fibrillation.  With these rhythm disturbances the patient has not had any congestive heart failure and/or anginal symptoms or EKG changes consistent with ischemia.  Previous evaluation with cardiac MRI shows some mild changes in the inferior wall possibly ischemic and or infarct of unknown etiology but the patient does not have any significant risk factors for coronary artery disease.    Cardiac catheterization showing normal LV systolic function with ejection fraction of 60% with no evidence of wall motion abnormalities or valvular heart disease changes.  There is minimal left anterior descending artery atherosclerosis but no other significant coronary disease seen Cardiac MRI has suggested there may be some inferior wall scarring of unknown etiology which may be the origin of rhythm disturbances which need further evaluation and treatment options by electrophysiologic study Still without tachycardia for more than 24hours now awaiting for transfer  Review of Systems: Positive for: None Negative for: Vision change,  hearing change, syncope, dizziness, nausea, vomiting,diarrhea, bloody stool, stomach pain, cough, congestion, diaphoresis, urinary frequency, urinary pain,skin lesions, skin rashes Others previously listed  Objective: Telemetry: Supraventricular tachycardia and ventricular tachycardia Physical Exam: Blood pressure 124/83, pulse (!) 58, temperature 98.2 F (36.8 C), temperature source Oral, resp. rate 14, height 6\' 2"  (1.88 m), weight 101.5 kg, SpO2 94 %. Body mass index is 28.73 kg/m. General: Well developed, well nourished, in no acute distress. Head: Normocephalic, atraumatic, sclera non-icteric, no xanthomas, nares are without discharge. Neck: No apparent masses Lungs: Normal respirations with no wheezes, no rhonchi, no rales , no crackles   Heart: Regular rate and rhythm, normal S1 S2, no murmur, no rub, no gallop, PMI is normal size and placement, carotid upstroke normal without bruit, jugular venous pressure normal Abdomen: Soft, non-tender, non-distended with normoactive bowel sounds. No hepatosplenomegaly. Abdominal aorta is normal size without bruit Extremities: No edema, no clubbing, no cyanosis, no ulcers,  Peripheral: 2+ radial, 2+ femoral, 2+ dorsal pedal pulses Neuro: Alert and oriented. Moves all extremities spontaneously. Psych:  Responds to questions appropriately with a normal affect.   Intake/Output Summary (Last 24 hours) at 07/16/2020 0849 Last data filed at 07/16/2020 0752 Gross per 24 hour  Intake 1072.91 ml  Output 1800 ml  Net -727.09 ml    Inpatient Medications:  . Chlorhexidine Gluconate Cloth  6 each Topical Daily  . enoxaparin (LOVENOX) injection  40 mg Subcutaneous Q24H  . fluticasone  1 spray Each Nare Daily  . irbesartan  300 mg Oral Daily   Or  . hydrochlorothiazide  25 mg Oral Daily  . metoprolol tartrate  25 mg Oral BID  . potassium chloride  10 mEq Oral Daily  . sodium chloride flush  3 mL Intravenous Q12H  .  tamsulosin  0.4 mg Oral q1800    Infusions:  . amiodarone 30 mg/hr (07/16/20 0752)  . cefTRIAXone (ROCEPHIN)  IV Stopped (07/15/20 1905)    Labs: Recent Labs    07/14/20 0440 07/15/20 2137  NA 139 139  K 3.5 3.7  CL 102 100  CO2 28 27  GLUCOSE 121* 114*  BUN 10 11  CREATININE 1.20 1.15  CALCIUM 8.7* 8.6*  MG 2.1  --    No results for input(s): AST, ALT, ALKPHOS, BILITOT, PROT, ALBUMIN in the last 72 hours. Recent Labs    07/14/20 0440  WBC 13.0*  HGB 12.0*  HCT 36.3*  MCV 91.2  PLT 224   No results for input(s): CKTOTAL, CKMB, TROPONINI in the last 72 hours. Invalid input(s): POCBNP No results for input(s): HGBA1C in the last 72 hours.   Weights: Filed Weights   07/12/20 2313  Weight: 101.5 kg     Radiology/Studies:  DG Chest 2 View  Result Date: 06/19/2020 CLINICAL DATA:  Chest pain.  History of atrial fibrillation. EXAM: CHEST - 2 VIEW COMPARISON:  09/28/2019 FINDINGS: Midline trachea. Normal heart size. No pleural effusion or pneumothorax. Subsegmental atelectasis or interval scarring in the left lung base laterally. No lobar consolidation. Mild biapical pleuroparenchymal scarring. IMPRESSION: No acute cardiopulmonary disease. Electronically Signed   By: Abigail Miyamoto M.D.   On: 06/19/2020 16:15   CARDIAC CATHETERIZATION  Result Date: 07/15/2020  Ost LAD to Prox LAD lesion is 20% stenosed.  Prox LAD lesion is 15% stenosed.  Mid LM lesion is 20% stenosed.  59 year old male with minimal risk factors for cardiovascular disease having paroxysmal nonvalvular atrial fibrillation and episode of syncope for which the patient was scheduled for pacemaker placement due to sick sinus syndrome.  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  He did not have myocardial infarction or  congestive heart failure.  Previous cardiac MRI suggested some possible lower right or inferior wall changes and/or scarring Left ventricle normal LV systolic function ejection fraction of 60% Minimal atherosclerosis of the distal left main LAD without evidence of significant stenoses Assessment Supraventricular tachycardia atrial fibrillation and left bundle branch block pattern ventricular tachycardia with syncope of unknown etiology without evidence of congestive heart failure or myocardial infarction or significant coronary atherosclerosis Plan Continue amiodarone drip with supplemental beta-blocker until further evaluation by electrophysiologic study Low-dose antiplatelet medication management after above for minimal coronary atherosclerosis We will consider high intensity cholesterol therapy as outpatient   DG Chest Portable 1 View  Result Date: 07/12/2020 CLINICAL DATA:  Atrial fibrillation EXAM: PORTABLE CHEST 1 VIEW COMPARISON:  06/19/2020 FINDINGS: The heart size and mediastinal contours are within normal limits. Both lungs are clear. The visualized skeletal structures are unremarkable. IMPRESSION: No active disease. Electronically Signed   By: Donavan Foil M.D.   On: 07/12/2020 23:31     Assessment and Recommendation  59 y.o. male with known apparent paroxysmal nonvalvular atrial fibrillation with episodes of syncope presyncope now with other rhythm disturbances revealed yesterday likely the primary cause of syncope presyncope including supraventricular tachycardia possibly consistent with AV node reentrant tachycardia and or of ventricular tachycardia with a left bundle branch pattern now currently controlled with medication management including beta-blocker and amiodarone  With cardiac catheterization showing normal LV systolic function no evidence of valvular heart disease and no significant coronary  artery atherosclerosis causing above issues MRI with possible scarring in the inferior wall  may be some of the origin of issues are requiring further intervention including electrophysiologic study.   1.  We will continue amiodarone and beta-blocker which have been ordered for this current issue and stabilized with amiodarone drip maintenance and oral beta-blocker 25 mg twice per day without rhythm changes for more than 24hours 2.  No further coronary intervention at this time due to no evidence of significant coronary atherosclerosis causing above 3.  Transfer to tertiary care for further evaluation of supraventricular tachycardia and ventricular tachycardia with left bundle branch pattern and electrophysiology study  Signed, Serafina Royals M.D. FACC

## 2020-07-16 NOTE — Progress Notes (Signed)
Pt was planned pacer placement  at [facility] before coming to the ED with Papillations, CP, near Syncope admitted to step down unit for Observation were pt developed  Symptomatic V.tach and was placed on Amio gtt, and transferred to CCU Cath lab on 9/30 was clean per cardiology note.  Report was called to Turner Daniels RN at [facility] at [phone] and pt will be going to room 7131. Pt was complaining of burning with urination, stated it get worse when urinating, UA was sent and resulted neg to UTI or any other organism. Pt wanted his foley to be changed, during report, Amy stated that will not be necessary since when he get to [facility] she will change out the foley anyway. Plan is Leave the foley in and they will changed it out foley upon arrival to [facility].  [facility] central information called spoke to  [employer] at [phone] to arrange transportion and he stated he will with ETA when Truck is available.   Report handoff to [facility].

## 2020-07-20 ENCOUNTER — Encounter: Admission: RE | Payer: Self-pay | Source: Home / Self Care

## 2020-07-20 ENCOUNTER — Ambulatory Visit: Admission: RE | Admit: 2020-07-20 | Payer: BC Managed Care – PPO | Source: Home / Self Care | Admitting: Cardiology

## 2020-07-20 HISTORY — PX: CARDIAC ELECTROPHYSIOLOGY STUDY AND ABLATION: SHX1294

## 2020-07-20 SURGERY — INSERTION, CARDIAC PACEMAKER
Anesthesia: Choice | Laterality: Left

## 2020-07-27 ENCOUNTER — Other Ambulatory Visit: Payer: Self-pay

## 2020-07-27 ENCOUNTER — Ambulatory Visit: Payer: BC Managed Care – PPO | Admitting: Physician Assistant

## 2020-07-27 ENCOUNTER — Ambulatory Visit (INDEPENDENT_AMBULATORY_CARE_PROVIDER_SITE_OTHER): Payer: BC Managed Care – PPO | Admitting: Physician Assistant

## 2020-07-27 DIAGNOSIS — R339 Retention of urine, unspecified: Secondary | ICD-10-CM | POA: Diagnosis not present

## 2020-07-27 LAB — BLADDER SCAN AMB NON-IMAGING: SCA Result: 43

## 2020-07-27 NOTE — Progress Notes (Signed)
07/27/2020 3:34 PM   John Hanna 01-10-1961 [id number]  CC: Chief Complaint  Patient presents with  . Urinary Retention   HPI: John Hanna is a 59 y.o. male with PMH paroxysmal A. fib, OSA on CPAP, and BPH with urinary retention who presents today for ED follow-up and voiding trial after a recent episode of urinary retention in the setting of a missed Flomax dose that occurred secondary to multiple syncopal and presyncopal episodes associated with his A. fib. In the interim, patient was admitted and then transferred to [facility], ultimately undergoing cardiac ablation.  Notably, admission UA suspicious for UTI.  No urine culture was sent despite recommendations to do so.  He received both Diflucan and ceftriaxone while admitted [facility] and ceftriaxone was continued while at [facility].  Today, he reports feeling well.  No acute concerns.  In-office UA this afternoon positive for trace-intact blood; urine microscopy pan negative. PVR 10mL.  PMH: Past Medical History:  Diagnosis Date  . Arthritis   . Atrial fibrillation ([place])   . Hypertension     Surgical History: Past Surgical History:  Procedure Laterality Date  . COLONOSCOPY  01/2011   repeat 3 yrs - @ [facility] internal medicine  . HERNIA REPAIR  1992  . LEFT HEART CATH AND CORONARY ANGIOGRAPHY N/A 07/15/2020   Procedure: LEFT HEART CATH AND CORONARY ANGIOGRAPHY;  Surgeon: Corey Skains, MD;  Location: [place] [facility];  Service: Cardiovascular;  Laterality: N/A;    Home Medications:  Allergies as of 07/27/2020      Reactions   Cyclobenzaprine Other (See Comments)   Acute urinary retention      Medication List       Accurate as of July 27, 2020  3:34 PM. If you have any questions, ask your nurse or doctor.        aspirin EC 81 MG tablet Take 81 mg by mouth daily.   fluticasone 50 MCG/ACT nasal spray Commonly known as: FLONASE Place 1 spray into both nostrils daily.   metoprolol succinate 50 MG 24  hr tablet Commonly known as: TOPROL-XL Take 75 mg by mouth daily.   NON FORMULARY CPAP Nightly.   potassium chloride 10 MEQ tablet Commonly known as: KLOR-CON Take 10 mEq by mouth daily.   tamsulosin 0.4 MG Caps capsule Commonly known as: FLOMAX Take 1 capsule (0.4 mg total) by mouth daily. What changed: when to take this   valsartan-hydrochlorothiazide 320-25 MG tablet Commonly known as: DIOVAN-HCT Take 1 tablet by mouth once daily       Allergies:  Allergies  Allergen Reactions  . Cyclobenzaprine Other (See Comments)    Acute urinary retention    Family History: Family History  Problem Relation Age of Onset  . Colon cancer Mother        died age 57  . Hypertension Father   . Prostate cancer Father 51    Social History:   reports that he quit smoking about 32 years ago. His smoking use included cigarettes. He has a 2.50 pack-year smoking history. He has never used smokeless tobacco. He reports that he does not drink alcohol and does not use drugs.  Physical Exam: There were no vitals taken for this visit.  Constitutional:  Alert and oriented, no acute distress, nontoxic appearing HEENT: [place], AT Cardiovascular: No clubbing, cyanosis, or edema Respiratory: Normal respiratory effort, no increased work of breathing Skin: No rashes, bruises or suspicious lesions Neurologic: Grossly intact, no focal deficits, moving all 4 extremities Psychiatric:  Normal mood and affect  Laboratory Data: Results for orders placed or performed in visit on 07/27/20  Microscopic Examination   Urine  Result Value Ref Range   WBC, UA 0-5 0 - 5 /hpf   RBC 0-2 0 - 2 /hpf   Epithelial Cells (non renal) None seen 0 - 10 /hpf   Bacteria, UA None seen None seen/Few  Urinalysis, Complete  Result Value Ref Range   Specific Gravity, UA 1.010 1.005 - 1.030   pH, UA 6.0 5.0 - 7.5   Color, UA Yellow Yellow   Appearance Ur Clear Clear   Leukocytes,UA Negative Negative   Protein,UA Negative  Negative/Trace   Glucose, UA Negative Negative   Ketones, UA Negative Negative   RBC, UA Trace (A) Negative   Bilirubin, UA Negative Negative   Urobilinogen, Ur 0.2 0.2 - 1.0 mg/dL   Nitrite, UA Negative Negative   Microscopic Examination See below:   Bladder Scan (Post Void Residual) in office  Result Value Ref Range   SCA Result 43    Assessment & Plan:   1. Urinary retention Foley removed in the morning, see separate procedure note for details. Patient returned to clinic this afternoon for repeat PVR. He reports drinking approximately 40oz of fluid. He has been able to urinate. PVR 35mL.  Voiding trial passed; UA benign. Counseled patient continue Flomax daily and that no further antibiotics are indicated.  He was originally scheduled for 1 month follow-up with Dr. Erlene Quan in approximately 8 days, will defer this an additional month due to concerns for falsely elevated PSA in the setting of recent catheterization.  - Urinalysis, Complete - Bladder Scan (Post Void Residual) in office   Return in about 4 weeks (around 08/24/2020) for IPSS, PVR, PSA, DRE with Dr. Erlene Quan.  Debroah Loop, PA-C  [facility] [address] [place], [place] [address] [phone]

## 2020-07-27 NOTE — Progress Notes (Signed)
Catheter Removal  Patient is present today for a catheter removal.  42ml of water was drained from the balloon. A 14FR coude foley cath was removed from the bladder no complications were noted . Patient tolerated well.  Performed by: Debroah Loop, PA-C   Follow up/ Additional notes: Push fluids and RTC this afternoon.

## 2020-07-28 LAB — URINALYSIS, COMPLETE
Bilirubin, UA: NEGATIVE
Glucose, UA: NEGATIVE
Ketones, UA: NEGATIVE
Leukocytes,UA: NEGATIVE
Nitrite, UA: NEGATIVE
Protein,UA: NEGATIVE
Specific Gravity, UA: 1.01 (ref 1.005–1.030)
Urobilinogen, Ur: 0.2 mg/dL (ref 0.2–1.0)
pH, UA: 6 (ref 5.0–7.5)

## 2020-07-28 LAB — MICROSCOPIC EXAMINATION
Bacteria, UA: NONE SEEN
Epithelial Cells (non renal): NONE SEEN /hpf (ref 0–10)

## 2020-08-04 ENCOUNTER — Ambulatory Visit: Payer: Self-pay | Admitting: Urology

## 2020-08-27 ENCOUNTER — Other Ambulatory Visit: Payer: Self-pay | Admitting: Internal Medicine

## 2020-08-27 DIAGNOSIS — I10 Essential (primary) hypertension: Secondary | ICD-10-CM

## 2020-08-28 NOTE — Telephone Encounter (Signed)
Requested Prescriptions  Pending Prescriptions Disp Refills   valsartan-hydrochlorothiazide (DIOVAN-HCT) 320-25 MG tablet [Pharmacy Med Name: Valsartan-hydroCHLOROthiazide 320-25 MG Oral Tablet] 90 tablet 0    Sig: Take 1 tablet by mouth once daily     Cardiovascular: ARB + Diuretic Combos Failed - 08/27/2020  8:00 PM      Failed - Ca in normal range and within 180 days    Calcium  Date Value Ref Range Status  07/15/2020 8.6 (L) 8.9 - 10.3 mg/dL Final         Failed - Valid encounter within last 6 months    Recent Outpatient Visits          6 months ago Annual physical exam   [facility] [name], MD   1 year ago Essential (primary) hypertension   [facility] [name], MD   1 year ago Episode of dizziness   [facility] [name], MD   1 year ago Annual physical exam   [facility] [name], MD   2 years ago Urinary hesitancy   [facility] [name], MD      Future Appointments            In 3 days [name], MD [place]   In 6 months [name], MD [facility] [place] in normal range and within 180 days    Potassium  Date Value Ref Range Status  07/15/2020 3.7 3.5 - 5.1 mmol/L Final         Passed - Na in normal range and within 180 days    Sodium  Date Value Ref Range Status  07/15/2020 139 135 - 145 mmol/L Final  02/26/2020 141 134 - 144 mmol/L Final         Passed - Cr in normal range and within 180 days    Creatinine, Ser  Date Value Ref Range Status  07/15/2020 1.15 0.61 - 1.24 mg/dL Final         Passed - Patient is not pregnant      Passed - Last BP in normal range    BP Readings from Last 1 Encounters:  07/16/20 132/81

## 2020-08-31 ENCOUNTER — Other Ambulatory Visit: Payer: Self-pay

## 2020-08-31 ENCOUNTER — Other Ambulatory Visit: Payer: BC Managed Care – PPO

## 2020-08-31 ENCOUNTER — Ambulatory Visit: Payer: Self-pay | Admitting: Urology

## 2020-08-31 DIAGNOSIS — N401 Enlarged prostate with lower urinary tract symptoms: Secondary | ICD-10-CM

## 2020-08-31 DIAGNOSIS — R351 Nocturia: Secondary | ICD-10-CM

## 2020-09-01 LAB — PSA: Prostate Specific Ag, Serum: 3.6 ng/mL (ref 0.0–4.0)

## 2020-09-08 ENCOUNTER — Other Ambulatory Visit: Payer: Self-pay

## 2020-09-08 ENCOUNTER — Ambulatory Visit (INDEPENDENT_AMBULATORY_CARE_PROVIDER_SITE_OTHER): Payer: BC Managed Care – PPO | Admitting: Urology

## 2020-09-08 VITALS — BP 155/83 | HR 71 | Ht 73.0 in | Wt 225.0 lb

## 2020-09-08 DIAGNOSIS — N401 Enlarged prostate with lower urinary tract symptoms: Secondary | ICD-10-CM

## 2020-09-08 DIAGNOSIS — R351 Nocturia: Secondary | ICD-10-CM | POA: Diagnosis not present

## 2020-09-08 LAB — BLADDER SCAN AMB NON-IMAGING: Scan Result: 2

## 2020-09-08 NOTE — Patient Instructions (Addendum)
Transrectal Ultrasound Transrectal ultrasound is a procedure that uses sound waves to create images of your prostate gland and nearby tissues. The sound waves are sent through the wall of your rectum into your prostate gland, which is located in front of your rectum. The images show the size and shape of your prostate gland and nearby structures. You may have this test if you have:  Trouble urinating.  Infertility.  An abnormal prostate screening exam. Tell a health care provider about:  Any allergies you have.  All medicines you are taking, including vitamins, herbs, eye drops, creams, and over-the-counter medicines.  Any blood disorders you have.  Any medical conditions you have.  Any surgeries you have had. What are the risks? Generally, this is a safe procedure. However, problems may occur, including:  Discomfort during the procedure. This is rare.  Blood in your urine or sperm after the procedure. This is rare. What happens before the procedure?  Your health care provider may instruct you to use an enema 1-4 hours before the procedure. Follow instructions from your health care provider about how to do the enema.  Ask your health care provider about changing or stopping your regular medicines. This is especially important if you are taking diabetes medicines or blood thinners. What happens during the procedure?  You will be asked to lie down on your left side on an examination table.  You will bend your knees toward your chest.  A lubricated probe will be gently inserted into your rectum. This may cause a feeling of fullness.  The probe will send signals to a computer that will create images.  The technician will slightly rotate the probe throughout the procedure. While rotating the probe, he or she will view and capture images of the prostate gland and the surrounding structures from different angles.  The probe will be removed. The procedure may vary among health care  providers and hospitals. What happens after the procedure?  It is up to you to get the results of your procedure. Ask your health care provider, or the department that is doing the procedure, when your results will be ready. Summary  Transrectal ultrasound is a procedure that uses sound waves to create images of your prostate gland and nearby tissues.  The images show the size and shape of your prostate gland and nearby structures.  Before the procedure, ask your health care provider about changing or stopping your regular medicines. This is especially important if you are taking diabetes medicines or blood thinners. This information is not intended to replace advice given to you by your health care provider. Make sure you discuss any questions you have with your health care provider. Document Revised: 09/14/2017 Document Reviewed: 08/25/2016 Elsevier Patient Education  2020 [employer].   Cystoscopy Cystoscopy is a procedure that is used to help diagnose and sometimes treat conditions that affect the lower urinary tract. The lower urinary tract includes the bladder and the urethra. The urethra is the tube that drains urine from the bladder. Cystoscopy is done using a thin, tube-shaped instrument with a light and camera at the end (cystoscope). The cystoscope may be hard or flexible, depending on the goal of the procedure. The cystoscope is inserted through the urethra, into the bladder. Cystoscopy may be recommended if you have:  Urinary tract infections that keep coming back.  Blood in the urine (hematuria).  An inability to control when you urinate (urinary incontinence) or an overactive bladder.  Unusual cells found in a  urine sample.  A blockage in the urethra, such as a urinary stone.  Painful urination.  An abnormality in the bladder found during an intravenous pyelogram (IVP) or CT scan. Cystoscopy may also be done to remove a sample of tissue to be examined under a  microscope (biopsy). What are the risks? Generally, this is a safe procedure. However, problems may occur, including:  Infection.  Bleeding.  What happens during the procedure?  1. You will be given one or more of the following: ? A medicine to numb the area (local anesthetic). 2. The area around the opening of your urethra will be cleaned. 3. The cystoscope will be passed through your urethra into your bladder. 4. Germ-free (sterile) fluid will flow through the cystoscope to fill your bladder. The fluid will stretch your bladder so that your health care provider can clearly examine your bladder walls. 5. Your doctor will look at the urethra and bladder. 6. The cystoscope will be removed The procedure may vary among health care providers  What can I expect after the procedure? After the procedure, it is common to have: 1. Some soreness or pain in your abdomen and urethra. 2. Urinary symptoms. These include: ? Mild pain or burning when you urinate. Pain should stop within a few minutes after you urinate. This may last for up to 1 week. ? A small amount of blood in your urine for several days. ? Feeling like you need to urinate but producing only a small amount of urine. Follow these instructions at home: General instructions  Return to your normal activities as told by your health care provider.   Do not drive for 24 hours if you were given a sedative during your procedure.  Watch for any blood in your urine. If the amount of blood in your urine increases, call your health care provider.  If a tissue sample was removed for testing (biopsy) during your procedure, it is up to you to get your test results. Ask your health care provider, or the department that is doing the test, when your results will be ready.  Drink enough fluid to keep your urine pale yellow.  Keep all follow-up visits as told by your health care provider. This is important. Contact a health care provider if  you:  Have pain that gets worse or does not get better with medicine, especially pain when you urinate.  Have trouble urinating.  Have more blood in your urine. Get help right away if you:  Have blood clots in your urine.  Have abdominal pain.  Have a fever or chills.  Are unable to urinate. Summary  Cystoscopy is a procedure that is used to help diagnose and sometimes treat conditions that affect the lower urinary tract.  Cystoscopy is done using a thin, tube-shaped instrument with a light and camera at the end.  After the procedure, it is common to have some soreness or pain in your abdomen and urethra.  Watch for any blood in your urine. If the amount of blood in your urine increases, call your health care provider.  If you were prescribed an antibiotic medicine, take it as told by your health care provider. Do not stop taking the antibiotic even if you start to feel better. This information is not intended to replace advice given to you by your health care provider. Make sure you discuss any questions you have with your health care provider. Document Revised: 09/24/2018 Document Reviewed: 09/24/2018 Elsevier Patient Education  2020 [employer]   Transurethral Resection of the Prostate Transurethral resection of the prostate (TURP) is the removal (resection) of part of the gland that produces semen (prostate gland). This procedure is done to treat benign prostatic hyperplasia (BPH). BPH is an abnormal, noncancerous (benign) increase in the number of cells that make up the prostate tissue. BPH causes the prostate to get bigger. The enlarged prostate can push against or block the tube that drains urine from the bladder out of the body (urethra). BPH can affect normal urine flow by causing bladder infections, difficulty controlling bladder function, and difficulty emptying the bladder.  The goal of TURP is to remove enough prostate tissue to allow for a normal flow of urine. The  procedure will allow you to empty your bladder more completely when you urinate so that you can urinate less often. In a transurethral resection, a thin telescope with a light, a tiny camera, and an electric cutting edge (resectoscope) is passed through the urethra and into the prostate. The opening of the urethra is at the end of the penis. Tell a health care provider about:  Any allergies you have.  All medicines you are taking, including vitamins, herbs, eye drops, creams, and over-the-counter medicines.  Any problems you or family members have had with anesthetic medicines.  Any blood disorders you have.  Any surgeries you have had.  Any medical conditions you have.  Any prostate infections you have had. What are the risks? Generally, this is a safe procedure. However, problems may occur, including: 7. Infection. 8. Bleeding. 9. Allergic reactions to medicines. 10. Damage to other structures or organs, such as: ? The urethra. ? The bladder. ? Muscles that surround the prostate. 11. Difficulty getting an erection. 12. Inability to control when you urinate (incontinence). 13. Scarring, which may cause problems with urine flow. What happens before the procedure? Medicines Ask your health care provider about:  Changing or stopping your regular medicines. This is especially important if you are taking diabetes medicines or blood thinners.  Taking medicines such as aspirin and ibuprofen. These medicines can thin your blood. Do not take these medicines unless your health care provider tells you to take them.  Taking over-the-counter medicines, vitamins, herbs, and supplements.  General instructions 3. You may have a physical exam. 4. You may have a blood or urine sample taken. 5. Ask your health care provider what steps will be taken to help prevent infection. These may include: ? Washing skin with a germ-killing soap. ? Taking antibiotic medicine. 6. Plan to have someone  take you home from the hospital or clinic. You may not be able to drive for up to 10 days after your procedure. 7. Plan to have a responsible adult care for you for at least 24 hours after you leave the hospital or clinic. This is important. What happens during the procedure?  1. An IV will be inserted into one of your veins. 2. You will be given one or more of the following: ? A medicine to help you relax (sedative). ? A medicine to make you fall asleep (general anesthetic). ? A medicine that is injected into your spine to numb the area below and slightly above the injection site (spinal anesthetic). 3. Your legs will be placed in foot rests (stirrups) so that your legs are apart and your knees are bent. 4. The resectoscope will be passed through your urethra to your prostate. 5. Parts of your prostate will be resected using the cutting edge of  the resectoscope. 6. The resectoscope will be removed. 7. A small, thin tube (catheter) will be passed through your urethra and into your bladder. The catheter will drain urine into a bag outside of your body. ? Fluid may be passed through the catheter to keep the catheter open. The procedure may vary among health care providers and hospitals. What happens after the procedure? 1. Your blood pressure, heart rate, breathing rate, and blood oxygen level will be monitored until you leave the hospital or clinic. 2. You may continue to receive fluids and medicines through an IV. 3. You may have some pain. Pain medicine will be available to help you. 4. You will have a catheter draining your urine. ? You may have blood in your urine. Your catheter may be kept in until your urine is clear. ? Your urinary drainage will be monitored. If necessary, your bladder may be rinsed out (irrigated) through your catheter. 5. You will be encouraged to walk around as soon as possible. 6. You may have to wear compression stockings. These stockings help prevent blood clots  and reduce swelling in your legs. 7. Do not drive for 24 hours if you were given a sedative during your procedure. Summary  Transurethral resection of the prostate (TURP) is the removal (resection) of part of the gland that produces semen (prostate gland).  The goal of this procedure is to remove enough prostate tissue to allow for a normal flow of urine.  Follow instructions from your health care provider about taking medicines and about eating and drinking before the procedure. This information is not intended to replace advice given to you by your health care provider. Make sure you discuss any questions you have with your health care provider. Document Revised: 01/22/2019 Document Reviewed: 07/03/2018 Elsevier Patient Education  [place] Laser Enucleation of the Prostate (HoLEP)  HoLEP is a treatment for men with benign prostatic hyperplasia (BPH). The laser surgery removed blockages of urine flow, and is done without any incisions on the body.     What is HoLEP?  HoLEP is a type of laser surgery used to treat obstruction (blockage) of urine flow as a result of benign prostatic hyperplasia (BPH). In men with BPH, the prostate gland is not cancerous, but has become enlarged. An enlarged prostate can result in a number of urinary tract symptoms such as weak urinary stream, difficulty in starting urination, inability to urinate, frequent urination, or getting up at night to urinate.  HoLEP was developed in the 1990's as a more effective and less expensive surgical option for BPH, compared to other surgical options such as laser vaporization(PVP/greenlight laser), transurethral resection of the prostate(TURP), and open simple prostatectomy.   What happens during a HoLEP?  HoLEP requires general anesthesia ("asleep" throughout the procedure).   An antibiotic is given to reduce the risk of infection  A surgical instrument called a resectoscope is inserted through the  urethra (the tube that carries urine from the bladder). The resectoscope has a camera that allows the surgeon to view the internal structure of the prostate gland, and to see where the incisions are being made during surgery.  The laser is inserted into the resectoscope and is used to enucleate (free up) the enlarged prostate tissue from the capsule (outer shell) and then to seal up any blood vessels. The tissue that has been removed is pushed back into the bladder.  A morcellator is placed through the resectoscope, and is used to suction out  the prostate tissue that has been pushed into the bladder.  When the prostate tissue has been removed, the resectoscope is removed, and a foley catheter is placed to allow healing and drain the urine from the bladder.     What happens after a HoLEP?  More than 90% of patients go home the same day a few hours after surgery. Less than 10% will be admitted to the hospital overnight for observation to monitor the urine, or if they have other medical problems.  Fluid is flushed through the catheter for about 1 hour after surgery to clear any blood from the urine. It is normal to have some blood in the urine after surgery. The need for blood transfusion is extremely rare.  Eating and drinking are permitted after the procedure once the patient has fully awakened from anesthesia.  The catheter is usually removed 2-3 days after surgery- the patient will come to clinic to have the catheter removed and make sure they can urinate on their own.  It is very important to drink lots of fluids after surgery for one week to keep the bladder flushed.  At first, there may be some burning with urination, but this typically improved within a few hours to days. Most patients do not have a significant amount of pain, and narcotic pain medications are rarely needed.  Symptoms of urinary frequency, urgency, and even leakage are NORMAL for the first few weeks after surgery as the  bladder adjusts after having to work hard against blockage from the prostate for many years. This will improve, but can sometimes take several months.  The use of pelvic floor exercises (Kegel exercises) can help improve problems with urinary incontinence.   After catheter removal, patients will be seen at 6 weeks and 6 months for symptom check  No heavy lifting for at least 2-3 weeks after surgery, however patients can walk and do light activities the first day after surgery. Return to work time depends on occupation.    What are the advantages of HoLEP?  HoLEP has been studied in many different parts of the world and has been shown to be a safe and effective procedure. Although there are many types of BPH surgeries available, HoLEP offers a unique advantage in being able to remove a large amount of tissue without any incisions on the body, even in very large prostates, while decreasing the risk of bleeding and providing tissue for pathology (to look for cancer). This decreases the need for blood transfusions during surgery, minimizes hospital stay, and reduces the risk of needing repeat treatment.  What are the side effects of HoLEP?  Temporary burning and bleeding during urination. Some blood may be seen in the urine for weeks after surgery and is part of the healing process.  Urinary incontinence (inability to control urine flow) is expected in all patients immediately after surgery and they should wear pads for the first few days/weeks. This typically improves over the course of several weeks. Performing Kegel exercises can help decrease leakage from stress maneuvers such as coughing, sneezing, or lifting. The rate of long term leakage is very low. Patients may also have leakage with urgency and this may be treated with medication. The risk of urge incontinence can be dependent on several factors including age, prostate size, symptoms, and other medical problems.  Retrograde ejaculation or  "backwards ejaculation." In 75% of cases, the patient will not see any fluid during ejaculation after surgery.  Erectile function is generally not significantly affected.  What are the risks of HoLEP?  Injury to the urethra or development of scar tissue at a later date  Injury to the capsule of the prostate (typically treated with longer catheterization).  Injury to the bladder or ureteral orifices (where the urine from the kidney drains out)  Infection of the bladder, testes, or kidneys  Return of urinary obstruction at a later date requiring another operation (<2%)  Need for blood transfusion or re-operation due to bleeding  Failure to relieve all symptoms and/or need for prolonged catheterization after surgery  5-15% of patients are found to have previously undiagnosed prostate cancer in their specimen. Prostate cancer can be treated after HoLEP.  Standard risks of anesthesia including blood clots, heart attacks, etc  When should I call my doctor?  Fever over 101.3 degrees  Inability to urinate, or large blood clots in the urine

## 2020-09-08 NOTE — Progress Notes (Signed)
09/08/2020 12:52 PM   John Hanna 24-May-1961 [id number]  Referring provider: [name], MD [address] [place],  [place] [address]  Chief Complaint  Patient presents with  . Benign Prostatic Hypertrophy    HPI: 59 year old male with a personal stream acute urinary retention x multiple episodes who returns today for follow-up.  He has had several episodes of urinary retention in the past.  Several have been provoked by cold medicine.  More recently, he underwent a voiding trial with me ultimately failed and ended up being admitted with palpitations another issues along with recurrent retention.   He was ultimately discharged from the hospital on 07/16/2020 with a Foley catheter in place started on Flomax.  He ultimately passed a voiding trial with our PA.  At baseline, his urinary symptoms reasonable.  Prior to these most recent episodes of retention, he is not on any BPH medications.  He since been started on Flomax.  Most recent PSA 3.6 on 08/31/2020.    Today, he reports he is doing very well.  He has minimal to no urinary symptoms.  PVR is minimal.  Component     Latest Ref Rng & Units 02/21/2016 02/20/2017 02/21/2018 02/25/2019  Prostate Specific Ag, Serum     0.0 - 4.0 ng/mL 2.6 2.1 2.0 2.3   Component     Latest Ref Rng & Units 02/26/2020 08/31/2020  Prostate Specific Ag, Serum     0.0 - 4.0 ng/mL 2.6 3.6      IPSS    Row Name 09/08/20 1100         International Prostate Symptom Score   How often have you had the sensation of not emptying your bladder? Less than half the time     How often have you had to urinate less than every two hours? Not at All     How often have you found you stopped and started again several times when you urinated? Not at All     How often have you found it difficult to postpone urination? Not at All     How often have you had a weak urinary stream? Less than half the time     How often have you had to strain to start  urination? Not at All     How many times did you typically get up at night to urinate? 2 Times     Total IPSS Score 6       Quality of Life due to urinary symptoms   If you were to spend the rest of your life with your urinary condition just the way it is now how would you feel about that? Mostly Satisfied            Score:  1-7 Mild 8-19 Moderate 20-35 Severe   PMH: Past Medical History:  Diagnosis Date  . Arthritis   . Atrial fibrillation ([place])   . Hypertension     Surgical History: Past Surgical History:  Procedure Laterality Date  . COLONOSCOPY  01/2011   repeat 3 yrs - @ [facility] internal medicine  . HERNIA REPAIR  1992  . LEFT HEART CATH AND CORONARY ANGIOGRAPHY N/A 07/15/2020   Procedure: LEFT HEART CATH AND CORONARY ANGIOGRAPHY;  Surgeon: Corey Skains, MD;  Location: [facility];  Service: Cardiovascular;  Laterality: N/A;    Home Medications:  Allergies as of 09/08/2020      Reactions   Cyclobenzaprine Other (See Comments)   Acute  urinary retention      Medication List       Accurate as of September 08, 2020 12:52 PM. If you have any questions, ask your nurse or doctor.        apixaban 5 MG Tabs tablet Commonly known as: ELIQUIS Take by mouth.   aspirin EC 81 MG tablet Take 81 mg by mouth daily.   atorvastatin 40 MG tablet Commonly known as: LIPITOR Take by mouth.   diltiazem 30 MG tablet Commonly known as: CARDIZEM Take 1 tablet (30mg ) as needed for elevated heart rate.   fluticasone 50 MCG/ACT nasal spray Commonly known as: FLONASE Place 1 spray into both nostrils daily.   metoprolol succinate 50 MG 24 hr tablet Commonly known as: TOPROL-XL Take 75 mg by mouth daily.   NON FORMULARY CPAP Nightly.   omeprazole 20 MG capsule Commonly known as: PRILOSEC Take 20 mg by mouth 2 (two) times daily.   potassium chloride 10 MEQ tablet Commonly known as: KLOR-CON Take 10 mEq by mouth daily.   tamsulosin 0.4 MG Caps  capsule Commonly known as: FLOMAX Take 1 capsule (0.4 mg total) by mouth daily. What changed: when to take this   valsartan-hydrochlorothiazide 320-25 MG tablet Commonly known as: DIOVAN-HCT Take 1 tablet by mouth once daily       Allergies:  Allergies  Allergen Reactions  . Cyclobenzaprine Other (See Comments)    Acute urinary retention    Family History: Family History  Problem Relation Age of Onset  . Colon cancer Mother        died age 27  . Hypertension Father   . Prostate cancer Father 73    Social History:  reports that he quit smoking about 32 years ago. His smoking use included cigarettes. He has a 2.50 pack-year smoking history. He has never used smokeless tobacco. He reports that he does not drink alcohol and does not use drugs.   Physical Exam: BP (!) 155/83   Pulse 71   Ht 6\' 1"  (1.854 m)   Wt 225 lb (102.1 kg)   BMI 29.69 kg/m   Constitutional:  Alert and oriented, No acute distress. HEENT: [place] AT, moist mucus membranes.  Trachea midline, no masses. Cardiovascular: No clubbing, cyanosis, or edema. Respiratory: Normal respiratory effort, no increased work of breathing. GI: Abdomen is soft, nontender, nondistended, no abdominal masses Rectal: Normal sphincter tone.  50 cc prostate, nontender, no nodules.  Prominent lateral ridges bilaterally. Skin: No rashes, bruises or suspicious lesions. Neurologic: Grossly intact, no focal deficits, moving all 4 extremities. Psychiatric: Normal mood and affect.  Laboratory Data: Lab Results  Component Value Date   WBC 13.0 (H) 07/14/2020   HGB 12.0 (L) 07/14/2020   HCT 36.3 (L) 07/14/2020   MCV 91.2 07/14/2020   PLT 224 07/14/2020    Lab Results  Component Value Date   CREATININE 1.15 07/15/2020    Pertinent Imaging: Results for orders placed or performed in visit on 09/08/20  BLADDER SCAN AMB NON-IMAGING  Result Value Ref Range   Scan Result 2 ml      Assessment & Plan:    1. BPH with urinary  retention PSA screening updated today, suspect recent bump in PSA related to recent episodes of retention and Foley catheterization-very low concern for prostate cancer we will continue to follow this annually  We had a lengthy discussion today.  He said multiple episodes of urinary retention this point in time often exacerbated by anticholinergics.  I suspect he has  severe/significant underlying BPH contributing to recurrent episodes.  He is doing fine currently on Flomax but was taking this when he failed a previous voiding trial.  He would likely benefit from an outlet procedure to avoid such episodes in the future.  We discussed various treatment options for this.  He was given information on holep as this, TURP and UroLift today.  He is agreeable to return for cystoscopy/TRUS for prostate sizing for surgical planning.  In the interim, continue Flomax.   [name], MD  [facility] [address] [place], [place] [address] [phone]

## 2020-10-28 ENCOUNTER — Encounter: Payer: Self-pay | Admitting: Urology

## 2020-11-03 ENCOUNTER — Ambulatory Visit (INDEPENDENT_AMBULATORY_CARE_PROVIDER_SITE_OTHER): Payer: BC Managed Care – PPO | Admitting: Urology

## 2020-11-03 ENCOUNTER — Other Ambulatory Visit: Payer: Self-pay

## 2020-11-03 ENCOUNTER — Encounter: Payer: Self-pay | Admitting: Urology

## 2020-11-03 VITALS — BP 129/73 | HR 69

## 2020-11-03 DIAGNOSIS — N401 Enlarged prostate with lower urinary tract symptoms: Secondary | ICD-10-CM | POA: Diagnosis not present

## 2020-11-03 DIAGNOSIS — Z87898 Personal history of other specified conditions: Secondary | ICD-10-CM

## 2020-11-03 DIAGNOSIS — R351 Nocturia: Secondary | ICD-10-CM

## 2020-11-03 MED ORDER — FINASTERIDE 5 MG PO TABS: 5.0000 mg | ORAL_TABLET | Freq: Every day | ORAL | 3 refills | Status: DC

## 2020-11-03 NOTE — Patient Instructions (Signed)

## 2020-11-04 LAB — URINALYSIS, COMPLETE
Bilirubin, UA: NEGATIVE
Glucose, UA: NEGATIVE
Ketones, UA: NEGATIVE
Leukocytes,UA: NEGATIVE
Nitrite, UA: NEGATIVE
RBC, UA: NEGATIVE
Specific Gravity, UA: 1.015 (ref 1.005–1.030)
Urobilinogen, Ur: 1 mg/dL (ref 0.2–1.0)
pH, UA: 8.5 — ABNORMAL HIGH (ref 5.0–7.5)

## 2020-11-04 LAB — MICROSCOPIC EXAMINATION: Bacteria, UA: NONE SEEN

## 2020-11-05 NOTE — Progress Notes (Signed)
11/04/19  Chief Complaint  Patient presents with  . TRUS  . Cysto     HPI: 60 year old male with refractory urinary symptoms related to BPH who presents today to the office for cystoscopy and prostate sizing   Please see previous notes for details.     Blood pressure 129/73, pulse 69. NED. A&Ox3.   No respiratory distress   Abd soft, NT, ND Normal phallus with bilateral descended testicles    Cystoscopy Procedure Note  Patient identification was confirmed, informed consent was obtained, and patient was prepped using Betadine solution.  Lidocaine jelly was administered per urethral meatus.    Preoperative abx where received prior to procedure.     Pre-Procedure: - Inspection reveals a normal caliber ureteral meatus.  Procedure: The flexible cystoscope was introduced without difficulty - No urethral strictures/lesions are present. - Enlarged prostate trilobar coaptation - Elevated bladder neck - Bilateral ureteral orifices identified - Bladder mucosa  reveals no ulcers, tumors, or lesions - No bladder stones -Mild/moderate trabeculation  Retroflexion shows large irregular intravesical median lobe   Post-Procedure: - Patient tolerated the procedure well   Prostate transrectal ultrasound sizing   Informed consent was obtained after discussing risks/benefits of the procedure.  A time out was performed to ensure correct patient identity.   Pre-Procedure: -Transrectal probe was placed without difficulty -Transrectal Ultrasound performed revealing a 145.75 gm prostate measuring 6.53 x 5.81 x 7.34 cm (length) -No significant hypoechoic or median lobe noted      Assessment/ Plan:  1. BPH associated with nocturia Massive prostamegaly today with large median lobe, approximately 145 g prostate with sequela of chronic bladder outlet obstruction  Currently on Flomax only, states that he has been doing fairly well recently and not had a recurrent episode of urinary  retention  We discussed today that I am strongly concerned that he will have recurrence of his symptoms especially his fairly young age and size of his gland.  Given the size of his prostate, would mow strongly recommend consideration of outlet procedure in the form of holep versus simple prostatectomy versus robotic simple prostatectomy.  We reviewed the surgery in detail today including the preoperative, intraoperative, and postoperative course.  This will most likely be an outpatient procedure pending the degree of post op hematuria.  He will go home with catheter for a few days post op and will either be taught how to remove his own catheter or return to the office for catheter removal.  Risk of bleeding, infection, damage surrounding structures, injury to the bladder/ urethral, bladder neck contracture, ureteral stricture, retrograde ejaculation, stress/ urge incontinence, exacerbation of irritative voiding symptoms were all discussed in detail.    At this point time, he is not interested, he would prefer to avoid surgical intervention.  We discussed the option of the addition of finasteride to help lessen his prostatic volume.  Discussed possible side effects.  He will try Flomax and finasteride in combination and then reassess in 6 months with IPSS/PVR or sooner if he develops worsening of his urinary symptoms. - Urinalysis, Complete  2. History of urinary retention As above    [name], MD

## 2020-11-24 ENCOUNTER — Other Ambulatory Visit: Payer: Self-pay | Admitting: Internal Medicine

## 2020-11-24 DIAGNOSIS — I10 Essential (primary) hypertension: Secondary | ICD-10-CM

## 2020-12-01 DIAGNOSIS — Z8679 Personal history of other diseases of the circulatory system: Secondary | ICD-10-CM | POA: Insufficient documentation

## 2020-12-01 DIAGNOSIS — Z9889 Other specified postprocedural states: Secondary | ICD-10-CM | POA: Insufficient documentation

## 2021-02-28 ENCOUNTER — Other Ambulatory Visit: Payer: Self-pay | Admitting: Urology

## 2021-02-28 ENCOUNTER — Other Ambulatory Visit: Payer: Self-pay | Admitting: Internal Medicine

## 2021-02-28 DIAGNOSIS — I10 Essential (primary) hypertension: Secondary | ICD-10-CM

## 2021-02-28 NOTE — Telephone Encounter (Signed)
Patient is coming in 05/19

## 2021-03-03 ENCOUNTER — Other Ambulatory Visit: Payer: Self-pay

## 2021-03-03 ENCOUNTER — Encounter: Payer: Self-pay | Admitting: Internal Medicine

## 2021-03-03 ENCOUNTER — Other Ambulatory Visit: Payer: Self-pay | Admitting: Urology

## 2021-03-03 ENCOUNTER — Ambulatory Visit (INDEPENDENT_AMBULATORY_CARE_PROVIDER_SITE_OTHER): Payer: BC Managed Care – PPO | Admitting: Internal Medicine

## 2021-03-03 VITALS — BP 102/80 | HR 70 | Temp 97.7°F | Ht 73.0 in | Wt 233.0 lb

## 2021-03-03 DIAGNOSIS — R351 Nocturia: Secondary | ICD-10-CM

## 2021-03-03 DIAGNOSIS — Z9989 Dependence on other enabling machines and devices: Secondary | ICD-10-CM

## 2021-03-03 DIAGNOSIS — Z1211 Encounter for screening for malignant neoplasm of colon: Secondary | ICD-10-CM

## 2021-03-03 DIAGNOSIS — G4733 Obstructive sleep apnea (adult) (pediatric): Secondary | ICD-10-CM

## 2021-03-03 DIAGNOSIS — Z23 Encounter for immunization: Secondary | ICD-10-CM

## 2021-03-03 DIAGNOSIS — Z0000 Encounter for general adult medical examination without abnormal findings: Secondary | ICD-10-CM

## 2021-03-03 DIAGNOSIS — I10 Essential (primary) hypertension: Secondary | ICD-10-CM | POA: Diagnosis not present

## 2021-03-03 DIAGNOSIS — N401 Enlarged prostate with lower urinary tract symptoms: Secondary | ICD-10-CM

## 2021-03-03 DIAGNOSIS — I480 Paroxysmal atrial fibrillation: Secondary | ICD-10-CM

## 2021-03-03 DIAGNOSIS — D6869 Other thrombophilia: Secondary | ICD-10-CM | POA: Insufficient documentation

## 2021-03-03 LAB — POCT URINALYSIS DIPSTICK
Bilirubin, UA: NEGATIVE
Blood, UA: NEGATIVE
Glucose, UA: NEGATIVE
Ketones, UA: NEGATIVE
Leukocytes, UA: NEGATIVE
Nitrite, UA: NEGATIVE
Protein, UA: NEGATIVE
Spec Grav, UA: 1.015 (ref 1.010–1.025)
Urobilinogen, UA: 0.2 E.U./dL
pH, UA: 6 (ref 5.0–8.0)

## 2021-03-03 MED ORDER — VALSARTAN-HYDROCHLOROTHIAZIDE 320-25 MG PO TABS: 1.0000 | ORAL_TABLET | Freq: Every day | ORAL | 3 refills | Status: DC

## 2021-03-03 NOTE — Progress Notes (Signed)
Date:  03/03/2021   Name:  Drago Hammonds   DOB:  [birth year]   MRN:  [id number]   Chief Complaint: Annual Exam  John Hanna is a 60 y.o. male who presents today for his Complete Annual Exam. He feels well. He reports exercising at walking sometimes. He reports he is sleeping well using CPAP nightly.  Colonoscopy: 01/2011  Immunization History  Administered Date(s) Administered  . Influenza, Quadrivalent, Recombinant, Inj, Pf 09/02/2019  . Influenza-Unspecified 07/20/2015, 07/30/2018  . PFIZER Comirnaty(Gray Top)Covid-19 Tri-Sucrose Vaccine 05/24/2020, 06/14/2020  . Tdap 02/12/2013  . Zoster 02/16/2014    Hypertension This is a chronic problem. The problem is controlled. Pertinent negatives include no chest pain, headaches, palpitations or shortness of breath. Past treatments include beta blockers, angiotensin blockers and diuretics (cardiazem PRN afib). Hypertensive end-organ damage includes CAD/MI.  Hyperlipidemia This is a chronic problem. The problem is controlled. Pertinent negatives include no chest pain, myalgias or shortness of breath. Current antihyperlipidemic treatment includes statins.  Atrial fibrillation - onset last year.  Had successful ablation.  Followed at [facility].  On Xarelto for anticoagulation. He feels well, no recurrence of Afib.  He does get mildly SOB at times with exertion. BPH - he had some urinary retention after his hospital stay.  He was seen by Urology and started on Flomax and Proscar.  Surgery was suggested but he is doing well currently and wants to postpone surgery as long as possible.  Lab Results  Component Value Date   CREATININE 1.15 07/15/2020   BUN 11 07/15/2020   NA 139 07/15/2020   K 3.7 07/15/2020   CL 100 07/15/2020   CO2 27 07/15/2020   Lab Results  Component Value Date   CHOL 134 07/13/2020   HDL 48 07/13/2020   LDLCALC 74 07/13/2020   TRIG 61 07/13/2020   CHOLHDL 2.8 07/13/2020   Lab Results  Component Value Date   TSH  1.655 07/13/2020   No results found for: HGBA1C Lab Results  Component Value Date   WBC 13.0 (H) 07/14/2020   HGB 12.0 (L) 07/14/2020   HCT 36.3 (L) 07/14/2020   MCV 91.2 07/14/2020   PLT 224 07/14/2020   Lab Results  Component Value Date   ALT 20 07/12/2020   AST 15 07/12/2020   ALKPHOS 47 07/12/2020   BILITOT 0.9 07/12/2020     Review of Systems  Constitutional: Negative for appetite change, chills, diaphoresis, fatigue and unexpected weight change.  HENT: Negative for hearing loss, tinnitus, trouble swallowing and voice change.   Eyes: Negative for visual disturbance.  Respiratory: Negative for choking, shortness of breath and wheezing.   Cardiovascular: Negative for chest pain, palpitations and leg swelling.  Gastrointestinal: Negative for abdominal pain, blood in stool, constipation and diarrhea.  Genitourinary: Negative for difficulty urinating, dysuria and frequency.  Musculoskeletal: Negative for arthralgias, back pain and myalgias.  Skin: Negative for color change and rash.  Neurological: Negative for dizziness, syncope and headaches.  Hematological: Negative for adenopathy.  Psychiatric/Behavioral: Negative for dysphoric mood and sleep disturbance.    Patient Active Problem List   Diagnosis Date Noted  . Acquired thrombophilia ([place]) 03/03/2021  . S/P ablation of atrial fibrillation 12/01/2020  . Acute urinary retention 07/13/2020  . Coronary artery disease involving native coronary artery of native heart without angina pectoris 05/04/2020  . Paroxysmal atrial fibrillation ([place]) 02/26/2020  . OSA on CPAP 06/20/2019  . BPH associated with nocturia 02/21/2018  . Essential (primary) hypertension 07/26/2015  . History of  colon polyps 07/26/2015  . H/O malignant neoplasm of skin 07/26/2015  . Plantar fasciitis 07/26/2015    Allergies  Allergen Reactions  . Cyclobenzaprine Other (See Comments)    Acute urinary retention    Past Surgical History:  Procedure  Laterality Date  . COLONOSCOPY  01/2011   repeat 3 yrs - @ [facility] internal medicine  . HERNIA REPAIR  1992  . LEFT HEART CATH AND CORONARY ANGIOGRAPHY N/A 07/15/2020   Procedure: LEFT HEART CATH AND CORONARY ANGIOGRAPHY;  Surgeon: Corey Skains, MD;  Location: [facility];  Service: Cardiovascular;  Laterality: N/A;    Social History   Tobacco Use  . Smoking status: Former Smoker    Packs/day: 0.50    Years: 5.00    Pack years: 2.50    Types: Cigarettes    Quit date: 10/17/1987    Years since quitting: 33.4  . Smokeless tobacco: Never Used  Vaping Use  . Vaping Use: Never used  Substance Use Topics  . Alcohol use: No    Alcohol/week: 0.0 standard drinks  . Drug use: Never     Medication list has been reviewed and updated.  No outpatient medications have been marked as taking for the 03/03/21 encounter (Office Visit) with [name], MD.    [facility] 2/9 Scores 03/03/2021 02/26/2020 06/20/2019 03/20/2019  PHQ - 2 Score 0 0 0 0  PHQ- 9 Score 1 0 0 -    GAD 7 : Generalized Anxiety Score 03/03/2021 02/26/2020  Nervous, Anxious, on Edge 0 0  Control/stop worrying 0 0  Worry too much - different things 0 0  Trouble relaxing 0 0  Restless 0 0  Easily annoyed or irritable 0 0  Afraid - awful might happen 0 0  Total GAD 7 Score 0 0  Anxiety Difficulty - Not difficult at all    BP Readings from Last 3 Encounters:  03/03/21 102/80  11/03/20 129/73  09/08/20 (!) 155/83    Physical Exam Vitals and nursing note reviewed.  Constitutional:      Appearance: Normal appearance. He is well-developed.  HENT:     Head: Normocephalic.     Right Ear: Tympanic membrane, ear canal and external ear normal.     Left Ear: Tympanic membrane, ear canal and external ear normal.     Nose: Nose normal.  Eyes:     Conjunctiva/sclera: Conjunctivae normal.     Pupils: Pupils are equal, round, and reactive to light.  Neck:     Thyroid: No thyromegaly.     Vascular: No carotid  bruit.  Cardiovascular:     Rate and Rhythm: Normal rate and regular rhythm.     Heart sounds: Normal heart sounds.  Pulmonary:     Effort: Pulmonary effort is normal.     Breath sounds: Normal breath sounds. No wheezing.  Chest:  Breasts:     Right: No mass.     Left: No mass.    Abdominal:     General: Bowel sounds are normal.     Palpations: Abdomen is soft.     Tenderness: There is no abdominal tenderness.  Musculoskeletal:        General: Normal range of motion.     Cervical back: Normal range of motion and neck supple.     Right lower leg: No edema.     Left lower leg: No edema.  Lymphadenopathy:     Cervical: No cervical adenopathy.  Skin:    General: Skin  is warm and dry.     Capillary Refill: Capillary refill takes less than 2 seconds.  Neurological:     General: No focal deficit present.     Mental Status: He is alert and oriented to person, place, and time.     Deep Tendon Reflexes: Reflexes are normal and symmetric.  Psychiatric:        Attention and Perception: Attention normal.        Mood and Affect: Mood normal.        Thought Content: Thought content normal.     Wt Readings from Last 3 Encounters:  03/03/21 233 lb (105.7 kg)  09/08/20 225 lb (102.1 kg)  07/12/20 223 lb 12.8 oz (101.5 kg)    BP 102/80   Pulse 70   Temp 97.7 F (36.5 C) (Oral)   Ht 6\' 1"  (1.854 m)   Wt 233 lb (105.7 kg)   SpO2 95%   BMI 30.74 kg/m   Assessment and Plan: 1. Annual physical exam Normal exam; continue healthy diet, exercise as tolerated - Comprehensive metabolic panel - Lipid panel  2. Colon cancer screening He will re-schedule his colonoscopy at [facility]  3. Essential (primary) hypertension Clinically stable exam with well controlled BP on valsartan hct and metoprolol. Tolerating medications without side effects at this time. Pt to continue current regimen and low sodium diet; benefits of regular exercise as able discussed. - CBC with Differential/Platelet -  POCT urinalysis dipstick - valsartan-hydrochlorothiazide (DIOVAN-HCT) 320-25 MG tablet; Take 1 tablet by mouth daily.  Dispense: 90 tablet; Refill: 3  4. Paroxysmal atrial fibrillation ([facility]) Now on metoprolol, lipitor and Xarelto  5. Acquired thrombophilia ([place]) On Xarelto - no bleeding issues  6. BPH associated with nocturia Symptoms are much improved with current medication Recommend continuing and follow up with Urology to verify adequate bladder emptying  7. OSA on CPAP Tolerating this well - using nightly No AM headaches, daytime somnolence  8. Need for vaccination for pneumococcus Recommend [id number]; patient may call back to have this done.   Partially dictated using Editor, commissioning. Any errors are unintentional.  Halina Maidens, MD [facility]  03/03/2021

## 2021-03-04 ENCOUNTER — Other Ambulatory Visit: Payer: Self-pay | Admitting: Urology

## 2021-03-04 LAB — COMPREHENSIVE METABOLIC PANEL
ALT: 28 IU/L (ref 0–44)
AST: 16 IU/L (ref 0–40)
Albumin/Globulin Ratio: 1.6 (ref 1.2–2.2)
Albumin: 4.5 g/dL (ref 3.8–4.9)
Alkaline Phosphatase: 55 IU/L (ref 44–121)
BUN/Creatinine Ratio: 11 (ref 9–20)
BUN: 12 mg/dL (ref 6–24)
Bilirubin Total: 0.6 mg/dL (ref 0.0–1.2)
CO2: 25 mmol/L (ref 20–29)
Calcium: 9.7 mg/dL (ref 8.7–10.2)
Chloride: 99 mmol/L (ref 96–106)
Creatinine, Ser: 1.12 mg/dL (ref 0.76–1.27)
Globulin, Total: 2.8 g/dL (ref 1.5–4.5)
Glucose: 122 mg/dL — ABNORMAL HIGH (ref 65–99)
Potassium: 4.3 mmol/L (ref 3.5–5.2)
Sodium: 141 mmol/L (ref 134–144)
Total Protein: 7.3 g/dL (ref 6.0–8.5)
eGFR: 77 mL/min/{1.73_m2} (ref >59)

## 2021-03-04 LAB — CBC WITH DIFFERENTIAL/PLATELET
Basophils Absolute: 0 10*3/uL (ref 0.0–0.2)
Basos: 0 %
EOS (ABSOLUTE): 0.2 10*3/uL (ref 0.0–0.4)
Eos: 3 %
Hematocrit: 42 % (ref 37.5–51.0)
Hemoglobin: 14.1 g/dL (ref 13.0–17.7)
Immature Grans (Abs): 0 10*3/uL (ref 0.0–0.1)
Immature Granulocytes: 0 %
Lymphocytes Absolute: 2.1 10*3/uL (ref 0.7–3.1)
Lymphs: 26 %
MCH: 30 pg (ref 26.6–33.0)
MCHC: 33.6 g/dL (ref 31.5–35.7)
MCV: 89 fL (ref 79–97)
Monocytes Absolute: 0.7 10*3/uL (ref 0.1–0.9)
Monocytes: 8 %
Neutrophils Absolute: 5 10*3/uL (ref 1.4–7.0)
Neutrophils: 63 %
Platelets: 250 10*3/uL (ref 150–450)
RBC: 4.7 x10E6/uL (ref 4.14–5.80)
RDW: 12.7 % (ref 11.6–15.4)
WBC: 8.1 10*3/uL (ref 3.4–10.8)

## 2021-03-04 LAB — LIPID PANEL
Chol/HDL Ratio: 2.9 ratio (ref 0.0–5.0)
Cholesterol, Total: 115 mg/dL (ref 100–199)
HDL: 39 mg/dL — ABNORMAL LOW (ref >39)
LDL Chol Calc (NIH): 54 mg/dL (ref 0–99)
Triglycerides: 121 mg/dL (ref 0–149)
VLDL Cholesterol Cal: 22 mg/dL (ref 5–40)

## 2021-03-07 ENCOUNTER — Encounter: Payer: Self-pay | Admitting: Internal Medicine

## 2021-03-07 DIAGNOSIS — R7303 Prediabetes: Secondary | ICD-10-CM | POA: Insufficient documentation

## 2021-03-07 LAB — SPECIMEN STATUS REPORT

## 2021-03-07 LAB — HGB A1C W/O EAG: Hgb A1c MFr Bld: 6.3 % — ABNORMAL HIGH (ref 4.8–5.6)

## 2021-04-08 IMAGING — DX DG CHEST 1V PORT
1 series · 1 of 1 positions shown · non-contrast
Comparison: 06/19/2020

CLINICAL DATA: Atrial fibrillation

EXAM:
PORTABLE CHEST 1 VIEW

[chest ap]
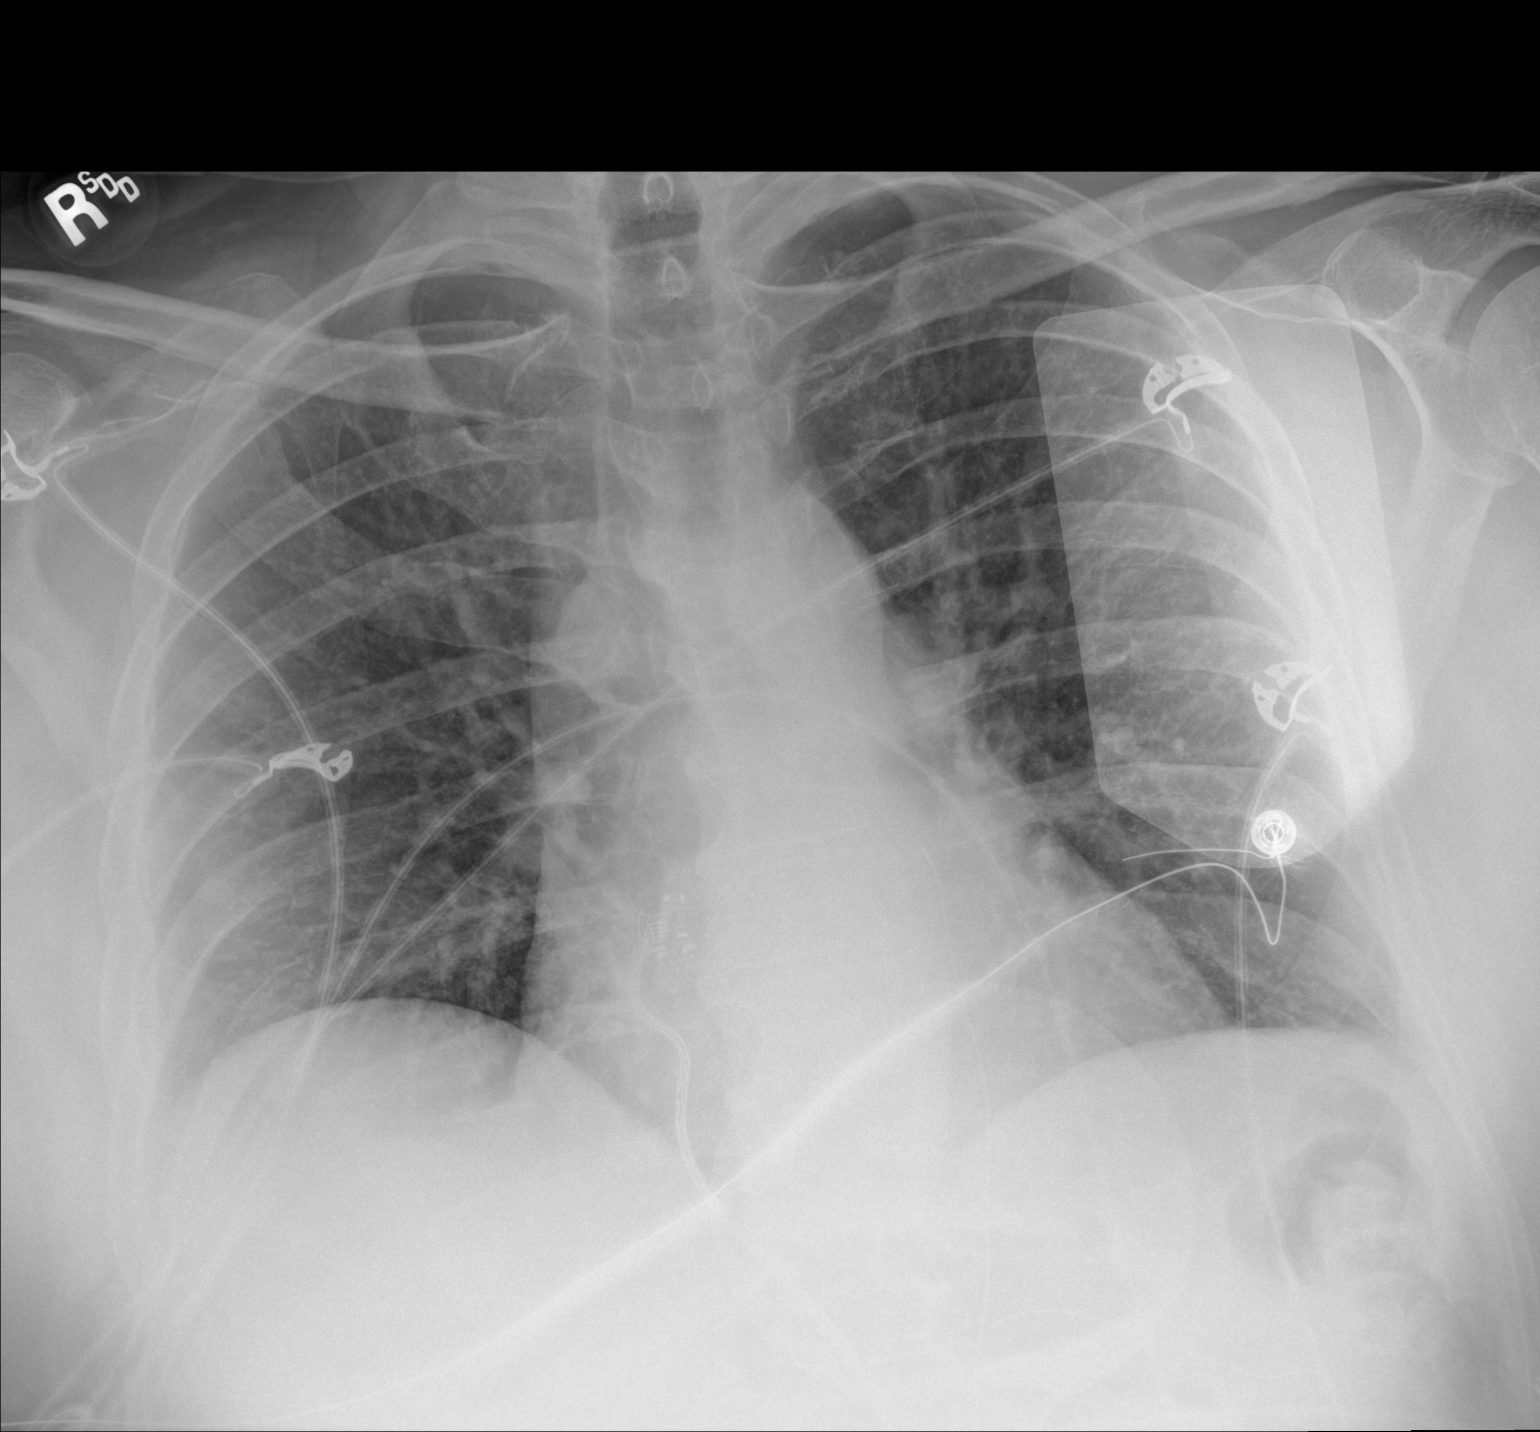

[1 of 1 positions shown; findings below may reference images not displayed]

FINDINGS: The heart size and mediastinal contours are within normal limits.
Both lungs are clear. The visualized skeletal structures are
unremarkable.
IMPRESSION: No active disease.

## 2021-04-24 ENCOUNTER — Other Ambulatory Visit: Payer: Self-pay | Admitting: Urology

## 2021-04-25 ENCOUNTER — Other Ambulatory Visit: Payer: Self-pay | Admitting: *Deleted

## 2021-04-25 MED ORDER — FINASTERIDE 5 MG PO TABS: 5.0000 mg | ORAL_TABLET | Freq: Every day | ORAL | 0 refills | Status: DC

## 2021-05-03 ENCOUNTER — Ambulatory Visit: Payer: Self-pay | Admitting: Urology

## 2021-05-17 NOTE — Progress Notes (Signed)
05/18/2021  2:52 PM   John Hanna 1961-04-16 [id number]  Referring provider: [name], MD [address] [place],  [place] [address] Chief Complaint  Patient presents with   Benign Prostatic Hypertrophy    HPI: John Hanna is a 60 y.o. with refractory urinary symptoms related to BPH who presents today for 6 month follow-up for IPSS/PVR.   PVR today was 56. IPSS score was a 7 today, doing well.    Patient underwent cystoscopy 11/03/2020, retroflexion showed large irregular intravesical median lobe. He also had a prostate transrectal ultrasound sizing which revealed a 145.75 gm prostate measuring 6.53 x 5.81 x 7.34 cm (length. No significant hypoechoic or median lobe noted   Patient states he is urinating less frequency, he feels like he is emptying well.   PSA Trend:  Component     Latest Ref Rng & Units 02/25/2019 02/26/2020 08/31/2020  Prostate Specific Ag, Serum     0.0 - 4.0 ng/mL 2.3 2.6 3.6      PMH: Past Medical History:  Diagnosis Date   Acute urinary retention 07/13/2020   Arthritis    Atrial fibrillation ([place])    Hypertension     Surgical History: Past Surgical History:  Procedure Laterality Date   COLONOSCOPY  01/2011   repeat 3 yrs - @ [facility] internal [place] CATH AND CORONARY ANGIOGRAPHY N/A 07/15/2020   Procedure: LEFT HEART CATH AND CORONARY ANGIOGRAPHY;  Surgeon: Corey Skains, MD;  Location: [facility];  Service: Cardiovascular;  Laterality: N/A;    Home Medications:  Allergies as of 05/18/2021       Reactions   Cyclobenzaprine Other (See Comments)   Acute urinary retention        Medication List        Accurate as of May 18, 2021  2:52 PM. If you have any questions, ask your nurse or doctor.          atorvastatin 40 MG tablet Commonly known as: LIPITOR Take by mouth.   diltiazem 30 MG tablet Commonly known as: CARDIZEM Take 1 tablet ('30mg'$ ) as needed  for elevated heart rate.   finasteride 5 MG tablet Commonly known as: PROSCAR Take 1 tablet (5 mg total) by mouth daily.   metoprolol succinate 50 MG 24 hr tablet Commonly known as: TOPROL-XL Take 75 mg by mouth daily.   NON FORMULARY CPAP Nightly.   potassium chloride 10 MEQ tablet Commonly known as: KLOR-CON Take 10 mEq by mouth daily.   tamsulosin 0.4 MG Caps capsule Commonly known as: FLOMAX Take 1 capsule (0.4 mg total) by mouth daily. What changed: when to take this   valsartan-hydrochlorothiazide 320-25 MG tablet Commonly known as: DIOVAN-HCT Take 1 tablet by mouth daily.   Xarelto 20 MG Tabs tablet Generic drug: rivaroxaban SMARTSIG:1 Tablet(s) By Mouth Every Evening        Allergies:  Allergies  Allergen Reactions   Cyclobenzaprine Other (See Comments)    Acute urinary retention    Family History: Family History  Problem Relation Age of Onset   Colon cancer Mother        died age 33   Hypertension Father    Prostate cancer Father 70    Social History:  reports that he quit smoking about 33 years ago. His smoking use included cigarettes. He has a 2.50 pack-year smoking history. He has never used smokeless tobacco. He reports that he does not drink  alcohol and does not use drugs.   Physical Exam: BP (!) 162/68   Pulse 79   Ht '6\' 1"'$  (1.854 m)   Wt 233 lb (105.7 kg)   BMI 30.74 kg/m   Constitutional:  Alert and oriented, No acute distress. HEENT: [place] AT, moist mucus membranes.  Trachea midline, no masses. Cardiovascular: No clubbing, cyanosis, or edema. Respiratory: Normal respiratory effort, no increased work of breathing. Skin: No rashes, bruises or suspicious lesions. Neurologic: Grossly intact, no focal deficits, moving all 4 extremities. Psychiatric: Normal mood and affect.  Laboratory Data:  Lab Results  Component Value Date   CREATININE 1.12 03/03/2021    Lab Results  Component Value Date   PSA 3.0 02/17/2015    Lab Results   Component Value Date   HGBA1C 6.3 (H) 03/03/2021     Pertinent Imaging: Results for orders placed or performed in visit on 05/18/21  Bladder Scan (Post Void Residual) in office  Result Value Ref Range   Scan Result 56     Assessment & Plan:    BPH  - Symptoms have improved on maximum medical therapy  -continue flomax and finasteride -reviewed alternative procedural / surgical options  History of urinary retention - emptying well today and prefers to stay on this regimen.   Follow-up in a year for IPSS/PVR and PSA.   I,Kailey Littlejohn,acting as a Education administrator for John Espy, MD.,have documented all relevant documentation on the behalf of John Espy, MD,as directed by  John Espy, MD while in the presence of John Espy, MD.  John Espy, MD   [facility] [address], [place], [place] [address] [phone]

## 2021-05-18 ENCOUNTER — Encounter: Payer: Self-pay | Admitting: Urology

## 2021-05-18 ENCOUNTER — Ambulatory Visit (INDEPENDENT_AMBULATORY_CARE_PROVIDER_SITE_OTHER): Payer: BC Managed Care – PPO | Admitting: Urology

## 2021-05-18 ENCOUNTER — Other Ambulatory Visit: Payer: Self-pay

## 2021-05-18 VITALS — BP 162/68 | HR 79 | Ht 73.0 in | Wt 233.0 lb

## 2021-05-18 DIAGNOSIS — R339 Retention of urine, unspecified: Secondary | ICD-10-CM | POA: Diagnosis not present

## 2021-05-18 DIAGNOSIS — R351 Nocturia: Secondary | ICD-10-CM

## 2021-05-18 DIAGNOSIS — N401 Enlarged prostate with lower urinary tract symptoms: Secondary | ICD-10-CM | POA: Diagnosis not present

## 2021-05-18 LAB — BLADDER SCAN AMB NON-IMAGING: Scan Result: 56

## 2021-05-18 MED ORDER — TAMSULOSIN HCL 0.4 MG PO CAPS: 0.4000 mg | ORAL_CAPSULE | Freq: Every day | ORAL | 11 refills | Status: DC

## 2021-05-18 MED ORDER — FINASTERIDE 5 MG PO TABS: 5.0000 mg | ORAL_TABLET | Freq: Every day | ORAL | 11 refills | Status: DC

## 2021-08-24 ENCOUNTER — Ambulatory Visit: Payer: BC Managed Care – PPO | Admitting: Internal Medicine

## 2021-08-24 ENCOUNTER — Encounter: Payer: Self-pay | Admitting: Internal Medicine

## 2021-08-24 ENCOUNTER — Other Ambulatory Visit: Payer: Self-pay

## 2021-08-24 VITALS — BP 124/80 | HR 62 | Ht 73.0 in | Wt 234.0 lb

## 2021-08-24 DIAGNOSIS — I2510 Atherosclerotic heart disease of native coronary artery without angina pectoris: Secondary | ICD-10-CM | POA: Diagnosis not present

## 2021-08-24 DIAGNOSIS — R7303 Prediabetes: Secondary | ICD-10-CM

## 2021-08-24 DIAGNOSIS — Z23 Encounter for immunization: Secondary | ICD-10-CM | POA: Diagnosis not present

## 2021-08-24 DIAGNOSIS — I10 Essential (primary) hypertension: Secondary | ICD-10-CM | POA: Diagnosis not present

## 2021-08-24 NOTE — Progress Notes (Signed)
Date:  08/24/2021   Name:  John Hanna   DOB:  09/22/1961   MRN:  [id number]   Chief Complaint: Hypertension, Diabetes, and Hyperlipidemia  Hypertension This is a chronic problem. The problem is controlled. Pertinent negatives include no chest pain, headaches, palpitations or shortness of breath. Past treatments include angiotensin blockers, calcium channel blockers, beta blockers and diuretics. The current treatment provides significant improvement. Hypertensive end-organ damage includes CAD/MI.  Diabetes He presents for his follow-up diabetic visit. Diabetes type: prediabetes. His disease course has been stable. Pertinent negatives for hypoglycemia include no dizziness or headaches. Pertinent negatives for diabetes include no chest pain, no fatigue and no weakness.  Hyperlipidemia This is a chronic problem. The problem is controlled. Pertinent negatives include no chest pain or shortness of breath. Current antihyperlipidemic treatment includes statins. The current treatment provides significant improvement of lipids.   Lab Results  Component Value Date   CREATININE 1.12 03/03/2021   BUN 12 03/03/2021   NA 141 03/03/2021   K 4.3 03/03/2021   CL 99 03/03/2021   CO2 25 03/03/2021   Lab Results  Component Value Date   CHOL 115 03/03/2021   HDL 39 (L) 03/03/2021   LDLCALC 54 03/03/2021   TRIG 121 03/03/2021   CHOLHDL 2.9 03/03/2021   Lab Results  Component Value Date   TSH 1.655 07/13/2020   Lab Results  Component Value Date   HGBA1C 6.3 (H) 03/03/2021   Lab Results  Component Value Date   WBC 8.1 03/03/2021   HGB 14.1 03/03/2021   HCT 42.0 03/03/2021   MCV 89 03/03/2021   PLT 250 03/03/2021   Lab Results  Component Value Date   ALT 28 03/03/2021   AST 16 03/03/2021   ALKPHOS 55 03/03/2021   BILITOT 0.6 03/03/2021     Review of Systems  Constitutional:  Negative for fatigue and unexpected weight change.  HENT:  Negative for nosebleeds.   Eyes:  Negative  for visual disturbance.  Respiratory:  Negative for cough, chest tightness, shortness of breath and wheezing.   Cardiovascular:  Negative for chest pain, palpitations and leg swelling.  Gastrointestinal:  Negative for abdominal pain, constipation and diarrhea.  Neurological:  Negative for dizziness, weakness, light-headedness and headaches.   Patient Active Problem List   Diagnosis Date Noted   Prediabetes 03/07/2021   Acquired thrombophilia ([place]) 03/03/2021   S/P ablation of atrial fibrillation 12/01/2020   Coronary artery disease involving native coronary artery of native heart without angina pectoris 05/04/2020   Paroxysmal atrial fibrillation ([place]) 02/26/2020   OSA on CPAP 06/20/2019   BPH associated with nocturia 02/21/2018   Essential (primary) hypertension 07/26/2015   History of colon polyps 07/26/2015   H/O malignant neoplasm of skin 07/26/2015   Plantar fasciitis 07/26/2015    Allergies  Allergen Reactions   Cyclobenzaprine Other (See Comments)    Acute urinary retention    Past Surgical History:  Procedure Laterality Date   COLONOSCOPY  01/2011   repeat 3 yrs - @ [facility] [place] CATH AND CORONARY ANGIOGRAPHY N/A 07/15/2020   Procedure: LEFT HEART CATH AND CORONARY ANGIOGRAPHY;  Surgeon: Corey Skains, MD;  Location: [facility];  Service: Cardiovascular;  Laterality: N/A;    Social History   Tobacco Use   Smoking status: Former    Packs/day: 0.50    Years: 5.00    Pack years: 2.50    Types: Cigarettes  Quit date: 10/17/1987    Years since quitting: 33.8   Smokeless tobacco: Never  Vaping Use   Vaping Use: Never used  Substance Use Topics   Alcohol use: No    Alcohol/week: 0.0 standard drinks   Drug use: Never     Medication list has been reviewed and updated.  Current Meds  Medication Sig   atorvastatin (LIPITOR) 40 MG tablet Take by mouth.   diltiazem (CARDIZEM) 30 MG tablet Take 1  tablet (30mg ) as needed for elevated heart rate.   finasteride (PROSCAR) 5 MG tablet Take 1 tablet (5 mg total) by mouth daily.   metoprolol succinate (TOPROL-XL) 50 MG 24 hr tablet Take 75 mg by mouth daily.    NON FORMULARY CPAP Nightly.   potassium chloride (KLOR-CON) 10 MEQ tablet Take 10 mEq by mouth daily.   tamsulosin (FLOMAX) 0.4 MG CAPS capsule Take 1 capsule (0.4 mg total) by mouth daily.   valsartan-hydrochlorothiazide (DIOVAN-HCT) 320-25 MG tablet Take 1 tablet by mouth daily.   XARELTO 20 MG TABS tablet SMARTSIG:1 Tablet(s) By Mouth Every Evening    PHQ 2/9 Scores 08/24/2021 03/03/2021 02/26/2020 06/20/2019  PHQ - 2 Score 0 0 0 0  PHQ- 9 Score 0 1 0 0    GAD 7 : Generalized Anxiety Score 08/24/2021 03/03/2021 02/26/2020  Nervous, Anxious, on Edge 0 0 0  Control/stop worrying 0 0 0  Worry too much - different things 0 0 0  Trouble relaxing 0 0 0  Restless 0 0 0  Easily annoyed or irritable 0 0 0  Afraid - awful might happen 0 0 0  Total GAD 7 Score 0 0 0  Anxiety Difficulty Not difficult at all - Not difficult at all    BP Readings from Last 3 Encounters:  08/24/21 124/80  05/18/21 (!) 162/68  03/03/21 102/80    Physical Exam Vitals and nursing note reviewed.  Constitutional:      General: He is not in acute distress.    Appearance: He is well-developed.  HENT:     Head: Normocephalic and atraumatic.  Neck:     Vascular: No carotid bruit.  Cardiovascular:     Rate and Rhythm: Normal rate and regular rhythm.     Pulses: Normal pulses.  Pulmonary:     Effort: Pulmonary effort is normal. No respiratory distress.  Musculoskeletal:     Cervical back: Normal range of motion.     Right lower leg: No edema.     Left lower leg: No edema.  Lymphadenopathy:     Cervical: No cervical adenopathy.  Skin:    General: Skin is warm and dry.     Capillary Refill: Capillary refill takes less than 2 seconds.     Findings: No rash.  Neurological:     General: No focal deficit  present.     Mental Status: He is alert and oriented to person, place, and time.  Psychiatric:        Mood and Affect: Mood normal.        Behavior: Behavior normal.    Wt Readings from Last 3 Encounters:  08/24/21 234 lb (106.1 kg)  05/18/21 233 lb (105.7 kg)  03/03/21 233 lb (105.7 kg)    BP 124/80   Pulse 62   Ht 6\' 1"  (1.854 m)   Wt 234 lb (106.1 kg)   SpO2 96%   BMI 30.87 kg/m   Assessment and Plan: 1. Essential (primary) hypertension Clinically stable exam with well controlled BP.  Tolerating medications without side effects at this time. Pt to continue current regimen and low sodium diet; benefits of regular exercise as able discussed. - Comprehensive metabolic panel  2. Prediabetes Continue to work on healthy diet and exercise - Hemoglobin A1c  3. Coronary artery disease involving native coronary artery of native heart without angina pectoris Doing well s/p ablation; on lipitor, metoprolol and PRN cardiazem   Partially dictated using Editor, commissioning. Any errors are unintentional.  Halina Maidens, MD [facility]  08/24/2021

## 2021-08-25 LAB — COMPREHENSIVE METABOLIC PANEL
ALT: 42 IU/L (ref 0–44)
AST: 23 IU/L (ref 0–40)
Albumin/Globulin Ratio: 1.7 (ref 1.2–2.2)
Albumin: 4.2 g/dL (ref 3.8–4.9)
Alkaline Phosphatase: 47 IU/L (ref 44–121)
BUN/Creatinine Ratio: 9 (ref 9–20)
BUN: 11 mg/dL (ref 6–24)
Bilirubin Total: 0.5 mg/dL (ref 0.0–1.2)
CO2: 26 mmol/L (ref 20–29)
Calcium: 9.2 mg/dL (ref 8.7–10.2)
Chloride: 103 mmol/L (ref 96–106)
Creatinine, Ser: 1.18 mg/dL (ref 0.76–1.27)
Globulin, Total: 2.5 g/dL (ref 1.5–4.5)
Glucose: 106 mg/dL — ABNORMAL HIGH (ref 70–99)
Potassium: 4.6 mmol/L (ref 3.5–5.2)
Sodium: 141 mmol/L (ref 134–144)
Total Protein: 6.7 g/dL (ref 6.0–8.5)
eGFR: 72 mL/min/{1.73_m2} (ref >59)

## 2021-08-25 LAB — HEMOGLOBIN A1C
Est. average glucose Bld gHb Est-mCnc: 131 mg/dL
Hgb A1c MFr Bld: 6.2 % — ABNORMAL HIGH (ref 4.8–5.6)

## 2022-03-06 ENCOUNTER — Encounter: Payer: Self-pay | Admitting: Internal Medicine

## 2022-03-06 ENCOUNTER — Ambulatory Visit (INDEPENDENT_AMBULATORY_CARE_PROVIDER_SITE_OTHER): Payer: BC Managed Care – PPO | Admitting: Internal Medicine

## 2022-03-06 VITALS — BP 122/70 | HR 52 | Ht 73.0 in | Wt 238.0 lb

## 2022-03-06 DIAGNOSIS — E782 Mixed hyperlipidemia: Secondary | ICD-10-CM | POA: Insufficient documentation

## 2022-03-06 DIAGNOSIS — I10 Essential (primary) hypertension: Secondary | ICD-10-CM

## 2022-03-06 DIAGNOSIS — R7303 Prediabetes: Secondary | ICD-10-CM

## 2022-03-06 DIAGNOSIS — Z1211 Encounter for screening for malignant neoplasm of colon: Secondary | ICD-10-CM | POA: Diagnosis not present

## 2022-03-06 DIAGNOSIS — Z0000 Encounter for general adult medical examination without abnormal findings: Secondary | ICD-10-CM | POA: Diagnosis not present

## 2022-03-06 DIAGNOSIS — I480 Paroxysmal atrial fibrillation: Secondary | ICD-10-CM

## 2022-03-06 DIAGNOSIS — N401 Enlarged prostate with lower urinary tract symptoms: Secondary | ICD-10-CM

## 2022-03-06 DIAGNOSIS — D6869 Other thrombophilia: Secondary | ICD-10-CM

## 2022-03-06 DIAGNOSIS — R351 Nocturia: Secondary | ICD-10-CM

## 2022-03-06 LAB — POCT URINALYSIS DIPSTICK
Bilirubin, UA: NEGATIVE
Blood, UA: NEGATIVE
Glucose, UA: NEGATIVE
Ketones, UA: NEGATIVE
Leukocytes, UA: NEGATIVE
Nitrite, UA: NEGATIVE
Protein, UA: POSITIVE — AB
Spec Grav, UA: 1.025 (ref 1.010–1.025)
Urobilinogen, UA: 0.2 E.U./dL
pH, UA: 6 (ref 5.0–8.0)

## 2022-03-06 MED ORDER — VALSARTAN-HYDROCHLOROTHIAZIDE 320-25 MG PO TABS: 1.0000 | ORAL_TABLET | Freq: Every day | ORAL | 3 refills | Status: DC

## 2022-03-06 NOTE — Progress Notes (Signed)
Date:  03/06/2022   Name:  Madoc Holquin   DOB:  08-Sep-1961   MRN:  [id number]   Chief Complaint: Annual Exam Forney Kleinpeter is a 61 y.o. male who presents today for his Complete Annual Exam. He feels well. He reports exercising/working hard. He reports he is sleeping well.   Colonoscopy: 01/2011 polyps TA  Immunization History  Administered Date(s) Administered   Influenza, Quadrivalent, Recombinant, Inj, Pf 09/02/2019   Influenza,inj,Quad PF,6+ Mos 08/24/2021   Influenza-Unspecified 07/20/2015, 07/30/2018   PFIZER Comirnaty([place])Covid-19 Tri-Sucrose Vaccine 05/24/2020, 06/14/2020   Tdap 02/12/2013   Zoster, Live 02/16/2014   Health Maintenance Due  Topic Date Due   Zoster Vaccines- Shingrix (1 of 2) Never done   COLONOSCOPY (Pts 45-30yr Insurance coverage will need to be confirmed)  01/28/2014    Lab Results  Component Value Date   PSA1 3.6 08/31/2020   PSA1 2.6 02/26/2020   PSA1 2.3 02/25/2019   PSA 3.0 02/17/2015    Hypertension This is a chronic problem. The problem is controlled. Pertinent negatives include no chest pain, headaches, palpitations or shortness of breath. Past treatments include angiotensin blockers and calcium channel blockers. The current treatment provides significant improvement. There are no compliance problems.  Hypertensive end-organ damage includes CAD/MI.  Hyperlipidemia This is a chronic problem. The problem is controlled. Pertinent negatives include no chest pain, myalgias or shortness of breath. Current antihyperlipidemic treatment includes statins.  Benign Prostatic Hypertrophy This is a chronic (followed by Urology) problem. The problem is unchanged. Irritative symptoms do not include frequency. Pertinent negatives include no chills or dysuria. Past treatments include tamsulosin and finasteride. The treatment provided moderate relief.   Lab Results  Component Value Date   NA 141 08/24/2021   K 4.6 08/24/2021   CO2 26 08/24/2021    GLUCOSE 106 (H) 08/24/2021   BUN 11 08/24/2021   CREATININE 1.18 08/24/2021   CALCIUM 9.2 08/24/2021   EGFR 72 08/24/2021   GFRNONAA >60 07/15/2020   Lab Results  Component Value Date   CHOL 115 03/03/2021   HDL 39 (L) 03/03/2021   LDLCALC 54 03/03/2021   TRIG 121 03/03/2021   CHOLHDL 2.9 03/03/2021   Lab Results  Component Value Date   TSH 1.655 07/13/2020   Lab Results  Component Value Date   HGBA1C 6.2 (H) 08/24/2021   Lab Results  Component Value Date   WBC 8.1 03/03/2021   HGB 14.1 03/03/2021   HCT 42.0 03/03/2021   MCV 89 03/03/2021   PLT 250 03/03/2021   Lab Results  Component Value Date   ALT 42 08/24/2021   AST 23 08/24/2021   ALKPHOS 47 08/24/2021   BILITOT 0.5 08/24/2021   No results found for: 25OHVITD2, 25OHVITD3, VD25OH   Review of Systems  Constitutional:  Negative for appetite change, chills, diaphoresis, fatigue and unexpected weight change.  HENT:  Negative for hearing loss, tinnitus, trouble swallowing and voice change.   Eyes:  Negative for visual disturbance.  Respiratory:  Negative for choking, shortness of breath and wheezing.   Cardiovascular:  Negative for chest pain, palpitations and leg swelling.  Gastrointestinal:  Negative for abdominal pain, blood in stool, constipation and diarrhea.  Genitourinary:  Negative for difficulty urinating, dysuria and frequency.  Musculoskeletal:  Negative for arthralgias, back pain and myalgias.  Skin:  Negative for color change and rash.  Neurological:  Negative for dizziness, syncope and headaches.  Hematological:  Negative for adenopathy.  Psychiatric/Behavioral:  Negative for dysphoric mood and sleep  disturbance. The patient is not nervous/anxious.    Patient Active Problem List   Diagnosis Date Noted   Mixed hyperlipidemia 03/06/2022   Prediabetes 03/07/2021   Acquired thrombophilia ([place]) 03/03/2021   S/P ablation of atrial fibrillation 12/01/2020   Coronary artery disease involving native  coronary artery of native heart without angina pectoris 05/04/2020   Paroxysmal atrial fibrillation ([place]) 02/26/2020   OSA on CPAP 06/20/2019   BPH associated with nocturia 02/21/2018   Essential (primary) hypertension 07/26/2015   History of colon polyps 07/26/2015   H/O malignant neoplasm of skin 07/26/2015   Plantar fasciitis 07/26/2015    Allergies  Allergen Reactions   Cyclobenzaprine Other (See Comments)    Acute urinary retention    Past Surgical History:  Procedure Laterality Date   CARDIAC ELECTROPHYSIOLOGY STUDY AND ABLATION  07/20/2020   COLONOSCOPY  01/2011   repeat 3 yrs - @ [facility] [place] CATH AND CORONARY ANGIOGRAPHY N/A 07/15/2020   Procedure: LEFT HEART CATH AND CORONARY ANGIOGRAPHY;  Surgeon: Corey Skains, MD;  Location: [facility];  Service: Cardiovascular;  Laterality: N/A;    Social History   Tobacco Use   Smoking status: Former    Packs/day: 0.50    Years: 5.00    Pack years: 2.50    Types: Cigarettes    Quit date: 10/17/1987    Years since quitting: 34.4   Smokeless tobacco: Never  Vaping Use   Vaping Use: Never used  Substance Use Topics   Alcohol use: No    Alcohol/week: 0.0 standard drinks   Drug use: Never     Medication list has been reviewed and updated.  Current Meds  Medication Sig   atorvastatin (LIPITOR) 40 MG tablet Take by mouth.   finasteride (PROSCAR) 5 MG tablet Take 1 tablet (5 mg total) by mouth daily.   metoprolol succinate (TOPROL-XL) 25 MG 24 hr tablet Take 25 mg by mouth daily.   NON FORMULARY CPAP Nightly.   tamsulosin (FLOMAX) 0.4 MG CAPS capsule Take 1 capsule (0.4 mg total) by mouth daily.   XARELTO 20 MG TABS tablet SMARTSIG:1 Tablet(s) By Mouth Every Evening   [DISCONTINUED] valsartan-hydrochlorothiazide (DIOVAN-HCT) 320-25 MG tablet Take 1 tablet by mouth daily.       03/06/2022    9:13 AM 08/24/2021    8:21 AM 03/03/2021   10:32 AM 02/26/2020    10:17 AM  GAD 7 : Generalized Anxiety Score  Nervous, Anxious, on Edge 0 0 0 0  Control/stop worrying 0 0 0 0  Worry too much - different things 0 0 0 0  Trouble relaxing 0 0 0 0  Restless 0 0 0 0  Easily annoyed or irritable 0 0 0 0  Afraid - awful might happen 0 0 0 0  Total GAD 7 Score 0 0 0 0  Anxiety Difficulty Not difficult at all Not difficult at all  Not difficult at all       03/06/2022    9:13 AM  Depression screen PHQ 2/9  Decreased Interest 3  Down, Depressed, Hopeless 0  PHQ - 2 Score 3  Altered sleeping 0  Tired, decreased energy 1  Change in appetite 0  Feeling bad or failure about yourself  0  Trouble concentrating 0  Moving slowly or fidgety/restless 0  Suicidal thoughts 0  PHQ-9 Score 4  Difficult doing work/chores Not difficult at all    BP Readings from  Last 3 Encounters:  03/06/22 122/70  08/24/21 124/80  05/18/21 (!) 162/68    Physical Exam Vitals and nursing note reviewed.  Constitutional:      Appearance: Normal appearance. He is well-developed.  HENT:     Head: Normocephalic.     Right Ear: Tympanic membrane, ear canal and external ear normal.     Left Ear: Tympanic membrane, ear canal and external ear normal.     Nose: Nose normal.  Eyes:     Conjunctiva/sclera: Conjunctivae normal.     Pupils: Pupils are equal, round, and reactive to light.  Neck:     Thyroid: No thyromegaly.     Vascular: No carotid bruit.  Cardiovascular:     Rate and Rhythm: Normal rate and regular rhythm.     Heart sounds: Normal heart sounds.  Pulmonary:     Effort: Pulmonary effort is normal.     Breath sounds: Normal breath sounds. No wheezing.  Chest:  Breasts:    Right: No mass.     Left: No mass.  Abdominal:     General: Bowel sounds are normal.     Palpations: Abdomen is soft.     Tenderness: There is no abdominal tenderness.  Musculoskeletal:        General: Normal range of motion.     Cervical back: Normal range of motion and neck supple.   Lymphadenopathy:     Cervical: No cervical adenopathy.  Skin:    General: Skin is warm and dry.  Neurological:     Mental Status: He is alert and oriented to person, place, and time.     Deep Tendon Reflexes: Reflexes are normal and symmetric.  Psychiatric:        Attention and Perception: Attention normal.        Mood and Affect: Mood normal.        Thought Content: Thought content normal.    Wt Readings from Last 3 Encounters:  03/06/22 238 lb (108 kg)  08/24/21 234 lb (106.1 kg)  05/18/21 233 lb (105.7 kg)    BP 122/70   Pulse (!) 52   Ht _0  (1.854 m)   Wt 238 lb (108 kg)   SpO2 96%   BMI 31.40 kg/m   Assessment and Plan: 1. Annual physical exam Exam is normal except for weight. Encourage regular exercise and appropriate dietary changes. He declines Shingrix today.  2. Colon cancer screening Overdue to repeat but [facility] failed to respond to referral. Will send locally. - Ambulatory referral to Gastroenterology  3. Essential (primary) hypertension Clinically stable exam with well controlled BP. Tolerating medications without side effects at this time. Pt to continue current regimen and low sodium diet; benefits of regular exercise as able discussed. Offered Shingrix - he declines - CBC with Differential/Platelet - Comprehensive metabolic panel - POCT urinalysis dipstick - valsartan-hydrochlorothiazide (DIOVAN-HCT) 320-25 MG tablet; Take 1 tablet by mouth daily.  Dispense: 90 tablet; Refill: 3  4. Prediabetes Check labs Recommend low carb diet/weight loss - Hemoglobin A1c  5. BPH associated with nocturia Moderate symptoms fairly well controlled on 2 medications - PSA  6. Mixed hyperlipidemia Check labs on Atorvastatin LDL goal < 70 - Lipid panel  7. Paroxysmal atrial fibrillation ([facility]) None since Ablation Continue Xarelto  8. Acquired thrombophilia ([place]) On Xarelto with no bleeding issues   Partially dictated using Editor, commissioning. Any errors  are unintentional.  Halina Maidens, MD [facility]  03/06/2022

## 2022-03-07 ENCOUNTER — Other Ambulatory Visit: Payer: Self-pay

## 2022-03-07 DIAGNOSIS — Z86010 Personal history of colonic polyps: Secondary | ICD-10-CM

## 2022-03-07 LAB — CBC WITH DIFFERENTIAL/PLATELET
Basophils Absolute: 0 10*3/uL (ref 0.0–0.2)
Basos: 0 %
EOS (ABSOLUTE): 0.2 10*3/uL (ref 0.0–0.4)
Eos: 2 %
Hematocrit: 40.9 % (ref 37.5–51.0)
Hemoglobin: 13.3 g/dL (ref 13.0–17.7)
Immature Grans (Abs): 0 10*3/uL (ref 0.0–0.1)
Immature Granulocytes: 0 %
Lymphocytes Absolute: 2.3 10*3/uL (ref 0.7–3.1)
Lymphs: 27 %
MCH: 29.7 pg (ref 26.6–33.0)
MCHC: 32.5 g/dL (ref 31.5–35.7)
MCV: 91 fL (ref 79–97)
Monocytes Absolute: 0.7 10*3/uL (ref 0.1–0.9)
Monocytes: 8 %
Neutrophils Absolute: 5.3 10*3/uL (ref 1.4–7.0)
Neutrophils: 63 %
Platelets: 229 10*3/uL (ref 150–450)
RBC: 4.48 x10E6/uL (ref 4.14–5.80)
RDW: 13.1 % (ref 11.6–15.4)
WBC: 8.5 10*3/uL (ref 3.4–10.8)

## 2022-03-07 LAB — HEMOGLOBIN A1C
Est. average glucose Bld gHb Est-mCnc: 137 mg/dL
Hgb A1c MFr Bld: 6.4 % — ABNORMAL HIGH (ref 4.8–5.6)

## 2022-03-07 LAB — COMPREHENSIVE METABOLIC PANEL
ALT: 26 IU/L (ref 0–44)
AST: 16 IU/L (ref 0–40)
Albumin/Globulin Ratio: 1.5 (ref 1.2–2.2)
Albumin: 4.1 g/dL (ref 3.8–4.9)
Alkaline Phosphatase: 51 IU/L (ref 44–121)
BUN/Creatinine Ratio: 13 (ref 9–20)
BUN: 15 mg/dL (ref 6–24)
Bilirubin Total: 0.5 mg/dL (ref 0.0–1.2)
CO2: 25 mmol/L (ref 20–29)
Calcium: 9.5 mg/dL (ref 8.7–10.2)
Chloride: 103 mmol/L (ref 96–106)
Creatinine, Ser: 1.12 mg/dL (ref 0.76–1.27)
Globulin, Total: 2.7 g/dL (ref 1.5–4.5)
Glucose: 115 mg/dL — ABNORMAL HIGH (ref 70–99)
Potassium: 4.3 mmol/L (ref 3.5–5.2)
Sodium: 142 mmol/L (ref 134–144)
Total Protein: 6.8 g/dL (ref 6.0–8.5)
eGFR: 76 mL/min/{1.73_m2} (ref >59)

## 2022-03-07 LAB — LIPID PANEL
Chol/HDL Ratio: 2.6 ratio (ref 0.0–5.0)
Cholesterol, Total: 108 mg/dL (ref 100–199)
HDL: 41 mg/dL (ref >39)
LDL Chol Calc (NIH): 48 mg/dL (ref 0–99)
Triglycerides: 97 mg/dL (ref 0–149)
VLDL Cholesterol Cal: 19 mg/dL (ref 5–40)

## 2022-03-07 LAB — PSA: Prostate Specific Ag, Serum: 1.4 ng/mL (ref 0.0–4.0)

## 2022-03-07 MED ORDER — NA SULFATE-K SULFATE-MG SULF 17.5-3.13-1.6 GM/177ML PO SOLN: 1.0000 | Freq: Once | ORAL | 0 refills | Status: AC

## 2022-03-07 NOTE — Progress Notes (Signed)
Gastroenterology Pre-Procedure Review Patient will bring by insurance card  Request Date: 03/29/2022 Requesting Physician: Dr. Marius Ditch   PATIENT REVIEW QUESTIONS: The patient responded to the following health history questions as indicated:    1. Are you having any GI issues? no 2. Do you have a personal history of Polyps? yes (last colonoscopy ) 3. Do you have a family history of Colon Cancer or Polyps? yes (mom colon cancer) 4. Diabetes Mellitus? no 5. Joint replacements in the past 12 months?no 6. Major health problems in the past 3 months?no 7. Any artificial heart valves, MVP, or defibrillator?no    MEDICATIONS & ALLERGIES:    Patient reports the following regarding taking any anticoagulation/antiplatelet therapy:   Plavix, Coumadin, Eliquis, Xarelto, Lovenox, Pradaxa, Brilinta, or Effient? yes (xarelto) Aspirin? no  Patient confirms/reports the following medications:  Current Outpatient Medications  Medication Sig Dispense Refill   atorvastatin (LIPITOR) 40 MG tablet Take by mouth.     finasteride (PROSCAR) 5 MG tablet Take 1 tablet (5 mg total) by mouth daily. 30 tablet 11   metoprolol succinate (TOPROL-XL) 25 MG 24 hr tablet Take 25 mg by mouth daily.     NON FORMULARY CPAP Nightly.     tamsulosin (FLOMAX) 0.4 MG CAPS capsule Take 1 capsule (0.4 mg total) by mouth daily. 30 capsule 11   valsartan-hydrochlorothiazide (DIOVAN-HCT) 320-25 MG tablet Take 1 tablet by mouth daily. 90 tablet 3   XARELTO 20 MG TABS tablet SMARTSIG:1 Tablet(s) By Mouth Every Evening     No current facility-administered medications for this visit.    Patient confirms/reports the following allergies:  Allergies  Allergen Reactions   Cyclobenzaprine Other (See Comments)    Acute urinary retention    No orders of the defined types were placed in this encounter.   AUTHORIZATION INFORMATION Primary Insurance: 1D#: Group #:  Secondary Insurance: 1D#: Group #:  SCHEDULE INFORMATION: Date:  03/29/2022 Time: Location: [facility]

## 2022-03-10 ENCOUNTER — Telehealth: Payer: Self-pay

## 2022-03-10 NOTE — Telephone Encounter (Signed)
Called patient and let him know we received his blood thinner patient can stop xarelto 1-2 days prior and restart 1 day after per [employer]

## 2022-03-21 ENCOUNTER — Telehealth: Payer: Self-pay | Admitting: Gastroenterology

## 2022-03-21 NOTE — Telephone Encounter (Signed)
Pt left message to change appointment for cololoscopy please call his wife at [phone]

## 2022-03-21 NOTE — Telephone Encounter (Signed)
Patients wife left vm to get patients colonoscopy rescheduled. Requesting call back.

## 2022-03-22 ENCOUNTER — Telehealth: Payer: Self-pay

## 2022-03-22 NOTE — Telephone Encounter (Signed)
Rescheduled patient sent new referral and new letters called endo

## 2022-05-17 ENCOUNTER — Other Ambulatory Visit: Payer: Self-pay | Admitting: *Deleted

## 2022-05-17 DIAGNOSIS — N401 Enlarged prostate with lower urinary tract symptoms: Secondary | ICD-10-CM

## 2022-05-18 ENCOUNTER — Telehealth: Payer: Self-pay

## 2022-05-18 ENCOUNTER — Ambulatory Visit: Payer: BC Managed Care – PPO | Admitting: Urology

## 2022-05-18 ENCOUNTER — Telehealth: Payer: Self-pay | Admitting: Gastroenterology

## 2022-05-18 ENCOUNTER — Other Ambulatory Visit: Payer: Self-pay

## 2022-05-18 VITALS — BP 131/83 | HR 54 | Ht 73.0 in | Wt 239.0 lb

## 2022-05-18 DIAGNOSIS — N401 Enlarged prostate with lower urinary tract symptoms: Secondary | ICD-10-CM | POA: Diagnosis not present

## 2022-05-18 DIAGNOSIS — R339 Retention of urine, unspecified: Secondary | ICD-10-CM

## 2022-05-18 DIAGNOSIS — R351 Nocturia: Secondary | ICD-10-CM

## 2022-05-18 NOTE — Progress Notes (Signed)
05/18/22 9:18 AM   [name] January 07, 1961 [id number]  Referring provider:  [name], MD [address] [place],  [place] [address] Chief Complaint  Patient presents with   Urinary Retention      HPI: John Hanna is a 61 y.o.male  with refractory urinary symptoms related to BPH who presents today for further evaluation of urinary retention.  Patient underwent cystoscopy 11/03/2020, retroflexion showed large irregular intravesical median lobe. He also had a prostate transrectal ultrasound sizing which revealed a 145.75 gm prostate measuring 6.53 x 5.81 x 7.34 cm (length. No significant hypoechoic or median lobe noted    He presents today with a Foley catheter that was placed about a week ago.  He was traveling at [facility] and ended up having significant difficulty emptying his bladder along with some dysuria.  Initially he was seen in urgent care started on Cipro but his symptoms did not resolve.  Ultimately he ended up at [facility] emergency room where Foley catheter was placed.  He reports no labs were done.  He reports that over a liter came out upon Foley catheter placement.  He has a personal history of urinary retention.  He is currently optimized on Flomax and finasteride.  He is considered outlet procedures in the past.  PMH: Past Medical History:  Diagnosis Date   Acute urinary retention 07/13/2020   Arthritis    Atrial fibrillation ([place])    Hypertension     Surgical History: Past Surgical History:  Procedure Laterality Date   CARDIAC ELECTROPHYSIOLOGY STUDY AND ABLATION  07/20/2020   COLONOSCOPY  01/2011   repeat 3 yrs - @ [facility] internal [place] CATH AND CORONARY ANGIOGRAPHY N/A 07/15/2020   Procedure: LEFT HEART CATH AND CORONARY ANGIOGRAPHY;  Surgeon: Corey Skains, MD;  Location: [facility];  Service: Cardiovascular;  Laterality: N/A;    Home Medications:   Allergies as of 05/18/2022       Reactions   Cyclobenzaprine Other (See Comments)   Acute urinary retention        Medication List        Accurate as of May 18, 2022 11:59 PM. If you have any questions, ask your nurse or doctor.          atorvastatin 40 MG tablet Commonly known as: LIPITOR Take by mouth.   ciprofloxacin 500 MG tablet Commonly known as: CIPRO SMARTSIG:1 Tablet(s) By Mouth Every 12 Hours   finasteride 5 MG tablet Commonly known as: PROSCAR Take 1 tablet (5 mg total) by mouth daily.   metoprolol succinate 25 MG 24 hr tablet Commonly known as: TOPROL-XL Take 25 mg by mouth daily.   NON FORMULARY CPAP Nightly.   tamsulosin 0.4 MG Caps capsule Commonly known as: FLOMAX Take 1 capsule (0.4 mg total) by mouth daily.   valsartan-hydrochlorothiazide 320-25 MG tablet Commonly known as: DIOVAN-HCT Take 1 tablet by mouth daily.   Xarelto 20 MG Tabs tablet Generic drug: rivaroxaban SMARTSIG:1 Tablet(s) By Mouth Every Evening        Allergies:  Allergies  Allergen Reactions   Cyclobenzaprine Other (See Comments)    Acute urinary retention    Family History: Family History  Problem Relation Age of Onset   Colon cancer Mother        died age 19   Hypertension Father    Prostate cancer Father 45    Social History:  reports  that he quit smoking about 34 years ago. His smoking use included cigarettes. He has a 2.50 pack-year smoking history. He has never used smokeless tobacco. He reports that he does not drink alcohol and does not use drugs.   Physical Exam: BP 131/83   Pulse (!) 54   Ht '6\' 1"'$  (1.854 m)   Wt 239 lb (108.4 kg)   BMI 31.53 kg/m   Constitutional:  Alert and oriented, No acute distress.  Foley catheter draining clear yellow urine.  Accompanied by his wife today. HEENT: [place] AT, moist mucus membranes.  Trachea midline, no masses. Cardiovascular: No clubbing, cyanosis, or edema. Respiratory: Normal respiratory effort, no  increased work of breathing. Skin: No rashes, bruises or suspicious lesions. Neurologic: Grossly intact, no focal deficits, moving all 4 extremities. Psychiatric: Normal mood and affect.  Laboratory Data:  Lab Results  Component Value Date   CREATININE 1.12 03/06/2022   Lab Results  Component Value Date   HGBA1C 6.4 (H) 03/06/2022    Assessment & Plan:    Urinary retention  - Catheter removed today , able to void but was instilled into the bladder -Strict warning symptoms return later today or as needed if his symptoms recur or he has difficulty voiding -Continue Flomax and finasteride for the time being - In light of history of retention times multiple occasions recommend he undergo a procedure such as HoLEP.  - We discussed alternatives including TURP vs. holmium laser enucleation of the prostate vs. greenlight laser ablation. Differences between the surgical procedures were discussed as well as the risks and benefits of each.  He is most interested in HoLEP.  We discussed the common postoperative course following holep including need for overnight Foley catheter, temporary worsening of irritative voiding symptoms, and occasional stress incontinence which typically lasts up to 6 months but can persist.  We discussed retrograde ejaculation and damage to surrounding structures including the urinary sphincter. Other uncommon complications including hematuria and urinary tract infection.  He understands all of the above and is willing to proceed as planned.     Conley Rolls as a Education administrator for [name], MD.,have documented all relevant documentation on the behalf of [name], MD,as directed by  [name], MD while in the presence of [name], MD.  I have reviewed the above documentation for accuracy and completeness, and I agree with the above.   [name], MD   [facility] [address] [place], [place]  [address] [phone]  I spent 45 total minutes on the day of the encounter including pre-visit review of the medical record, face-to-face time with the patient, and post visit ordering of labs/imaging/tests.  I spent time trying to obtain records from [facility], reviewing his chart, performing voiding trial as well as counseling for surgery.

## 2022-05-18 NOTE — Telephone Encounter (Signed)
Returned patients call.  LVM for him to call back.  Thanks, [place], [place]

## 2022-05-18 NOTE — Telephone Encounter (Signed)
Patient has a few questions about his upcoming colonoscopy. Please return patients call.

## 2022-05-18 NOTE — Progress Notes (Signed)
Fill and Pull Catheter Removal  Patient is present today for a catheter removal.  Patient was cleaned and prepped in a sterile fashion 180 ml of sterile water/ saline was instilled into the bladder when the patient felt the urge to urinate. 35m of water was then drained from the balloon.  A 18FR foley cath was removed from the bladder no complications were noted .  Patient as then given some time to void on their own.  Patient can void  250 ml on their own after some time.  Patient tolerated well.  Performed by: AKerman Passey RMA   Follow up/ Additional notes: as discussed

## 2022-05-18 NOTE — Patient Instructions (Signed)

## 2022-05-19 ENCOUNTER — Other Ambulatory Visit: Payer: Self-pay | Admitting: Urology

## 2022-05-19 DIAGNOSIS — N401 Enlarged prostate with lower urinary tract symptoms: Secondary | ICD-10-CM

## 2022-05-19 NOTE — Progress Notes (Signed)
Surgical Physician Order Form [facility] [place]  * Scheduling expectation : Next Available  *Length of Case:   *Clearance needed: yes- clear to hold Xarelto from prescribing doctor  *Anticoagulation Instructions: Hold all anticoagulants  *Aspirin Instructions: Hold Aspirin  *Post-op visit Date/Instructions:  2 days foley removal, 6 weeks IPSS/PVR  *Diagnosis: BPH with urinary retention  *Procedure:  HOLEP (06269)   Additional orders: N/A  -Admit type: OUTpatient  -Anesthesia: General  -VTE Prophylaxis Standing Order SCD's       Other:   -Standing Lab Orders Per Anesthesia    Lab other: UA&Urine Culture  -Standing Test orders EKG/Chest x-ray per Anesthesia       Test other:   - Medications:  Ancef 2gm IV  -Other orders:  N/A

## 2022-05-21 ENCOUNTER — Other Ambulatory Visit: Payer: Self-pay

## 2022-05-21 ENCOUNTER — Emergency Department: Payer: BC Managed Care – PPO

## 2022-05-21 ENCOUNTER — Encounter: Payer: Self-pay | Admitting: Emergency Medicine

## 2022-05-21 ENCOUNTER — Emergency Department
Admission: EM | Admit: 2022-05-21 | Discharge: 2022-05-21 | Disposition: A | Discharge status: Home / Self Care | Payer: BC Managed Care – PPO | Attending: Emergency Medicine | Admitting: Emergency Medicine

## 2022-05-21 DIAGNOSIS — R339 Retention of urine, unspecified: Secondary | ICD-10-CM | POA: Diagnosis not present

## 2022-05-21 DIAGNOSIS — R109 Unspecified abdominal pain: Secondary | ICD-10-CM | POA: Insufficient documentation

## 2022-05-21 HISTORY — DX: Benign prostatic hyperplasia without lower urinary tract symptoms: N40.0

## 2022-05-21 LAB — CBC
HCT: 42.1 % (ref 39.0–52.0)
Hemoglobin: 13.8 g/dL (ref 13.0–17.0)
MCH: 29.4 pg (ref 26.0–34.0)
MCHC: 32.8 g/dL (ref 30.0–36.0)
MCV: 89.6 fL (ref 80.0–100.0)
Platelets: 271 10*3/uL (ref 150–400)
RBC: 4.7 MIL/uL (ref 4.22–5.81)
RDW: 12.3 % (ref 11.5–15.5)
WBC: 10.2 10*3/uL (ref 4.0–10.5)
nRBC: 0 % (ref 0.0–0.2)

## 2022-05-21 LAB — BASIC METABOLIC PANEL
Anion gap: 10 (ref 5–15)
BUN: 14 mg/dL (ref 6–20)
CO2: 24 mmol/L (ref 22–32)
Calcium: 9.4 mg/dL (ref 8.9–10.3)
Chloride: 106 mmol/L (ref 98–111)
Creatinine, Ser: 1.2 mg/dL (ref 0.61–1.24)
GFR, Estimated: >60 mL/min (ref >60)
Glucose, Bld: 103 mg/dL — ABNORMAL HIGH (ref 70–99)
Potassium: 3.3 mmol/L — ABNORMAL LOW (ref 3.5–5.1)
Sodium: 140 mmol/L (ref 135–145)

## 2022-05-21 MED ORDER — LIDOCAINE HCL URETHRAL/MUCOSAL 2 % EX GEL
1.0000 | Freq: Once | CUTANEOUS | Status: AC
  Administered 2022-05-21: 1 via URETHRAL
  Filled 2022-05-21: qty 6

## 2022-05-21 MED ORDER — POTASSIUM CHLORIDE 20 MEQ PO PACK
40.0000 meq | PACK | ORAL | Status: AC
  Administered 2022-05-21: 40 meq via ORAL
  Filled 2022-05-21: qty 2

## 2022-05-21 NOTE — ED Provider Notes (Signed)
[facility] Provider Note    Event Date/Time   First MD Initiated Contact with Patient 05/21/22 1303     (approximate)   History   Urinary Retention   HPI  John Hanna is a 61 y.o. male  History of atrial fibrillation and prostate enlargement  A little over a week ago he was on vacation at the beach when he had difficulty urinating unable to empty his bladder.  Reports is actually happened to have a couple times in the past year and he sees Dr. Erlene Quan of urology has been told he has a large prostate and needs to have a procedure to have this improved but has not yet been scheduled  He had a Foley catheter inserted and used it for a week, had a taken out on Thursday and reports that he had difficulty urinating again last night and then today able to only urinate a little bit and his bladder again feels blocked.  He relates he was placed on ciprofloxacin and completed that course.  No fevers or chills.  Had some mild right lower flank pain for about the last week and a half as well seems to correspond with times when he needs to urinate or get block  Denies abdominal pain.  No blood in his urine no foul odor      Physical Exam   Triage Vital Signs: ED Triage Vitals  Enc Vitals Group     BP 05/21/22 1241 123/81     Pulse Rate 05/21/22 1241 61     Resp 05/21/22 1241 20     Temp 05/21/22 1241 97.7 F (36.5 C)     Temp Source 05/21/22 1241 Oral     SpO2 05/21/22 1241 97 %     Weight 05/21/22 1240 239 lb (108.4 kg)     Height 05/21/22 1240 '6\' 1"'$  (1.854 m)     Head Circumference --      Peak Flow --      Pain Score 05/21/22 1240 9     Pain Loc --      Pain Edu? --      Excl. in [place]? --     Most recent vital signs: Vitals:   05/21/22 1241  BP: 123/81  Pulse: 61  Resp: 20  Temp: 97.7 F (36.5 C)  SpO2: 97%     General: Awake, no distress.  CV:  Good peripheral perfusion.  Resp:  Normal effort.  Abd:  No distention.  No abdominal  pain except reports your urge to urinate to suprapubic palpation and bladder does feel slightly palpable Other:     ED Results / Procedures / Treatments   Labs (all labs ordered are listed, but only abnormal results are displayed) Labs Reviewed  BASIC METABOLIC PANEL - Abnormal; Notable for the following components:      Result Value   Potassium 3.3 (*)    Glucose, Bld 103 (*)    All other components within normal limits  URINE CULTURE  CBC     EKG     RADIOLOGY CT imaging interpreted by me for gross pathology, I do not see evidence of gross acute intra-abdominal pathology    CT Renal Stone Study  Result Date: 05/21/2022 CLINICAL DATA:  Difficulty urinating. History of enlarged prostate. Bladder catheter removed 4 days ago. Also with right flank discomfort for 1 week. EXAM: CT ABDOMEN AND PELVIS WITHOUT CONTRAST TECHNIQUE: Multidetector CT imaging of the abdomen and pelvis was performed following the  standard protocol without IV contrast. RADIATION DOSE REDUCTION: This exam was performed according to the departmental dose-optimization program which includes automated exposure control, adjustment of the mA and/or kV according to patient size and/or use of iterative reconstruction technique. COMPARISON:  None Available. FINDINGS: Lower chest: Clear lung bases. Hepatobiliary: No focal liver abnormality is seen. No gallstones, gallbladder wall thickening, or biliary dilatation. Pancreas: Unremarkable. No pancreatic ductal dilatation or surrounding inflammatory changes. Spleen: Normal in size without focal abnormality. Adrenals/Urinary Tract: Normal adrenal glands. Kidneys normal in size, orientation and position. Two low-attenuation left renal masses, 2.2 and 2 cm, medial midpole and lower pole respectively, average Hounsfield units between -5 and -8, consistent with cysts. No other renal lesions, no stones and no hydronephrosis. Ureters are normal in course and in caliber. Bladder is  mostly decompressed with a Foley catheter. Stomach/Bowel: Normal stomach. Small bowel and colon are normal in caliber. No wall thickening. No inflammation. Sigmoid colon diverticula. Normal appendix visualized. Vascular/Lymphatic: Mild aortic atherosclerosis. No aneurysm. No enlarged lymph nodes. Reproductive: Prostate not well separated from the bladder base, but is at least mildly enlarged, approximately 4.8 x 4.7 cm transversely. Other: Prior repair left inguinal hernia. No current hernia. No ascites. Musculoskeletal: No acute fracture. Slight chronic depression of the upper endplate of L3 stable from radiographs dated 08/06/2018. No acute fracture. Small areas sclerosis in the right femoral neck. No destructive bone lesion. IMPRESSION: 1. No acute findings within the abdomen or pelvis. No renal or ureteral stones or obstructive uropathy. 2. Bladder decompressed by Foley catheter, not well assessed. 3. Enlarged prostate. Electronically Signed   By: Lajean Manes M.D.   On: 05/21/2022 16:13    Reviewed radiologist interpretation as above   PROCEDURES:  Critical Care performed: No  Procedures   MEDICATIONS ORDERED IN ED: Medications  lidocaine (XYLOCAINE) 2 % jelly 1 Application (1 Application Urethral Given 05/21/22 1445)  potassium chloride (KLOR-CON) packet 40 mEq (40 mEq Oral Given 05/21/22 1613)     IMPRESSION / MDM / ASSESSMENT AND PLAN / ED COURSE  I reviewed the triage vital signs and the nursing notes.                              Differential diagnosis includes, but is not limited to, bladder outlet obstruction, urinary retention, no associated acute neurologic symptoms, has a history of enlarged prostate with issues with voiding in the past and currently is on Flomax.  Had Foley catheter removed just a few days ago, and symptoms recurring.  No clear symptoms to suggest acute urinary tract infection  Postvoid residual about 700 mL, will insert Foley once again.  Also given associated  right flank pain patient and his wife feel like Make sure he does not have some like a kidney stone that could have occurred while he was at the beach, will obtain CT stone to exclude obstructive pathology and stone  Patient's presentation is most consistent with acute complicated illness / injury requiring diagnostic workup.   ----------------------------------------- 4:24 PM on 05/21/2022 ----------------------------------------- labs including CBC and metabolic panel interpreted as normal.  Patient resting comfortably symptoms improved after Foley catheterization.  Draining clear urine yellow in nature approximately 500 cc in the bag noted at this time.  Patient comfortable with plan for follow-up with [facility], have also placed an urgent referral request, and patient quite familiar with operation and use of Foley catheter and bag.  Careful return precautions advised  Return precautions and treatment recommendations and follow-up discussed with the patient who is agreeable with the plan.   FINAL CLINICAL IMPRESSION(S) / ED DIAGNOSES   Final diagnoses:  Urinary retention     Rx / DC Orders   ED Discharge Orders          Ordered    Ambulatory referral to Urology       Comments: Recurrent urinary retention (just had foley taken out), now has foley again   05/21/22 1618             Note:  This document was prepared using Dragon voice recognition software and may include unintentional dictation errors.   Delman Kitten, MD 05/21/22 [phone]

## 2022-05-21 NOTE — ED Notes (Signed)
See triage note. Presents with some diff urination   Hx of same  States he had a cath removed last Thursday  Was able to void until about 2 am

## 2022-05-21 NOTE — ED Triage Notes (Signed)
Pt reports has an enlarged prostate and at times he has difficulty emptying his bladder. Pt reports just had the catheter taken out Thursday and now he is having trouble urinating again. Pt reports has been draining a very small amount and is uncomfortable.

## 2022-05-22 ENCOUNTER — Telehealth: Payer: Self-pay | Admitting: Urology

## 2022-05-22 ENCOUNTER — Other Ambulatory Visit: Payer: Self-pay | Admitting: Urology

## 2022-05-22 LAB — URINE CULTURE: Culture: NO GROWTH

## 2022-05-22 NOTE — Telephone Encounter (Signed)
Patient had another ED visit for urinary retention and has a foley cath. Angela Nevin (spouse) has some questions on the procedure he needs to schedule. Please call her at [phone]

## 2022-05-23 ENCOUNTER — Ambulatory Visit: Payer: Self-pay | Admitting: Urology

## 2022-05-23 NOTE — Telephone Encounter (Signed)
  Patient informed, voiced understanding.   Patient stopped taking Xarelto on 05/19/22-He states he decided on his own to discontinue he thought he would get surgery scheduled sooner.      ===========================================   Please call.  Either CIC teaching or keep foley until surgery  [name], MD

## 2022-05-24 ENCOUNTER — Telehealth: Payer: Self-pay

## 2022-05-24 NOTE — Telephone Encounter (Signed)
Patients wife call has been returned.  Patient and wife were both on the phone call.  Patients wife Angela Nevin informed me that her husband had to have a catheter placed due to inability to urinate.  He has an enlarged prostate.  Since the catheter has been placed he is not feeling up to having his colonoscopy.  Experiencing some back pain.  He was scheduled for his colonoscopy on Friday 08/11 with Dr. Marius Ditch and had stopped Xarelto as advised in preparation for his colonoscopy.  Advised patient and wife since he is not feeling well at this time we will reschedule his colonoscopy after he has things resolved with his prostate.  Informed him that he can resume his Xarelto.  Asked them to call the office back when ready to reschedule colonoscopy.  Thanks,  [place], [place]

## 2022-05-26 ENCOUNTER — Encounter: Admission: RE | Payer: Self-pay | Source: Home / Self Care

## 2022-05-26 ENCOUNTER — Ambulatory Visit
Admission: RE | Admit: 2022-05-26 | Payer: BC Managed Care – PPO | Source: Home / Self Care | Admitting: Gastroenterology

## 2022-05-26 SURGERY — COLONOSCOPY WITH PROPOFOL
Anesthesia: General

## 2022-05-30 ENCOUNTER — Ambulatory Visit (INDEPENDENT_AMBULATORY_CARE_PROVIDER_SITE_OTHER): Payer: BC Managed Care – PPO | Admitting: Physician Assistant

## 2022-05-30 DIAGNOSIS — T839XXA Unspecified complication of genitourinary prosthetic device, implant and graft, initial encounter: Secondary | ICD-10-CM

## 2022-06-01 ENCOUNTER — Telehealth: Payer: Self-pay

## 2022-06-01 ENCOUNTER — Telehealth: Payer: Self-pay | Admitting: Urgent Care

## 2022-06-01 NOTE — Progress Notes (Signed)
[facility] Posting Form   Surgery Date/Time: Date: 07/03/2022  Surgeon: Dr. [name], MD  Surgery Location: Day Surgery  Inpt ( No  )   Outpt (Yes)   Obs ( No  )   Diagnosis: Benign Prostatic Hyperplasia with Urinary Retention N40.1, R33.8  -CPT: 84859  Surgery: Holmium Laser Enucleation of the Prostate  Stop Anticoagulations: Yes and hold ASA  Cardiac/Medical/Pulmonary Clearance needed: yes  Clearance needed from Dr: Omelia Blackwater  Clearance request sent on: Date: 06/01/22, sent by Honor Loh, NP with Peri-Operative Services  *Orders entered into EPIC  Date: 06/01/22   *Case booked in EPIC  Date: 05/29/2022  *Notified pt of Surgery: Date: 05/29/2022  PRE-OP UA & CX: yes, will obtain in clinic on 06/20/2022  *Placed into Prior Authorization Work Fabio Bering Date: 06/01/22   Assistant/laser/rep:No

## 2022-06-01 NOTE — Progress Notes (Signed)
  Perioperative Services Pre-Admission/Anesthesia Testing     Date: 06/01/22  Name: John Hanna MRN:   [id number]  Re: Request from surgery for clearance prior to scheduled procedure  Patient is scheduled to undergo a HOLEP-LASER ENUCLEATION OF THE PROSTATE WITH MORCELLATION on 07/03/2022 with Dr. [name], MD. Patient has not been scheduled for his PAT appointment at this point, thus has not undergone review by PAT RN and/or APP. Received communication from primary attending surgeon's office requesting that patient be submitted for clearance from cardiology.   PROVIDER SPECIALTY FAXED TO   Westly Pam, MD  Cardiology  [phone]   Plan:  Clearance documents generated and faxed to appropriate provider(s) as noted above. Note will be updated to reflect communication with provider's office as it relates to clearance being provided and/or the need for office visit prior to clearance for surgery being issued.   Honor Loh, MSN, APRN, FNP-C, CEN [facility]  Peri-operative Services Nurse Practitioner Phone: [phone] 06/01/22 4:27 PM  NOTE: This note has been prepared using Dragon dictation software. Despite my best ability to proofread, there is always the potential that unintentional transcriptional errors may still occur from this process.

## 2022-06-01 NOTE — Telephone Encounter (Signed)
I spoke with John Hanna. We have discussed possible surgery dates and Monday September 18th, 2023 was agreed upon by all parties. Patient given information about surgery date, what to expect pre-operatively and post operatively.  We discussed that a Pre-Admission Testing office will be calling to set up the pre-op visit that will take place prior to surgery, and that these appointments are typically done over the phone with a Pre-Admissions RN.  Informed patient that our office will communicate any additional care to be provided after surgery. Patients questions or concerns were discussed during our call. Advised to call our office should there be any additional information, questions or concerns that arise. Patient verbalized understanding.

## 2022-06-02 NOTE — Progress Notes (Signed)
Patient present in office today complaining of leg bag leakage. Leg bag was exchanged with a new bag with out complications. Patient will keep follow up as scheduled.

## 2022-06-08 ENCOUNTER — Telehealth: Payer: Self-pay | Admitting: Urgent Care

## 2022-06-08 NOTE — Progress Notes (Signed)
  Perioperative Services Pre-Admission/Anesthesia Testing     Date: 06/08/22  Name: John Hanna MRN:   [id number]  Re: Surgical clearance for HOLEP-LASER ENUCLEATION OF THE PROSTATE WITH MORCELLATION  Surgical clearance received from Dr. Beckey Downing office (cardiology). Per cardiology, "he is cleared from a cardiac perspective to proceed with procedure. All questions and concerns were addressed today. He will call with recurrence of atrial fibrillation. Otherwise, he will follow up in 1 year". He has been cleared to hold his RIVAROXABAN dose for 3 days prior to his  procedure with plans to restart as soon as postoperative bleeding risk to be minimized by his primary attending surgeon. Note in [place] from cardiology provider. If signed form is returned, I will place on patient's OR chart for review by the surgical/anesthetic team on the day of his procedure.   Honor Loh, MSN, APRN, FNP-C, CEN [facility]  Peri-operative Services Nurse Practitioner Phone: [phone] 06/08/22 9:43 AM

## 2022-06-09 ENCOUNTER — Telehealth: Payer: Self-pay | Admitting: Urgent Care

## 2022-06-09 NOTE — Progress Notes (Addendum)
  Perioperative Services Pre-Admission/Anesthesia Testing     Date: 06/09/22  Name: John Hanna MRN:   [id number]  Re: Surgical clearance for HOLEP-LASER ENUCLEATION OF THE PROSTATE WITH MORCELLATION on 07/03/2022  Signed clearance form received from cardiology for the above procedure. Form placed on patient's chart for review by the OR/anesthesia team on the day of his procedure.    Honor Loh, MSN, APRN, FNP-C, CEN [facility]  Peri-operative Services Nurse Practitioner Phone: [phone] 06/09/22 5:01 PM

## 2022-06-12 ENCOUNTER — Ambulatory Visit: Payer: BC Managed Care – PPO | Admitting: Physician Assistant

## 2022-06-20 ENCOUNTER — Ambulatory Visit (INDEPENDENT_AMBULATORY_CARE_PROVIDER_SITE_OTHER): Payer: BC Managed Care – PPO | Admitting: Physician Assistant

## 2022-06-20 DIAGNOSIS — N401 Enlarged prostate with lower urinary tract symptoms: Secondary | ICD-10-CM | POA: Diagnosis not present

## 2022-06-20 DIAGNOSIS — R338 Other retention of urine: Secondary | ICD-10-CM

## 2022-06-20 LAB — URINALYSIS, COMPLETE
Bilirubin, UA: NEGATIVE
Glucose, UA: NEGATIVE
Ketones, UA: NEGATIVE
Leukocytes,UA: NEGATIVE
Nitrite, UA: NEGATIVE
Specific Gravity, UA: 1.025 (ref 1.005–1.030)
Urobilinogen, Ur: 0.2 mg/dL (ref 0.2–1.0)
pH, UA: 7 (ref 5.0–7.5)

## 2022-06-20 LAB — MICROSCOPIC EXAMINATION

## 2022-06-20 NOTE — Progress Notes (Signed)
Cath Change/ Replacement  Patient is present today for a catheter change due to urinary retention.  53m of water was removed from the balloon, a 14FR foley cath was removed without difficulty.  Patient was cleaned and prepped in a sterile fashion with betadine and 2% lidocaine jelly was instilled into the urethra. A 16 FR coude foley cath was replaced into the bladder no complications were noted Urine return was noted 177mand urine was yellow in color. The balloon was filled with 1062mf sterile water. A leg bag was attached for drainage.  Patient tolerated well.    Performed by: SamDebroah LoopA-C  Additional notes: Urine sample obtained from new catheter for preop UA and culture.  Follow up: HOLEP with Dr. BraErlene Quan 07/03/2022

## 2022-06-23 ENCOUNTER — Other Ambulatory Visit: Payer: Self-pay

## 2022-06-23 ENCOUNTER — Encounter
Admission: RE | Admit: 2022-06-23 | Discharge: 2022-06-23 | Disposition: A | Discharge status: Home / Self Care | Payer: BC Managed Care – PPO | Source: Ambulatory Visit | Attending: Urology | Admitting: Urology

## 2022-06-23 HISTORY — DX: Malignant (primary) neoplasm, unspecified: C80.1

## 2022-06-23 HISTORY — DX: Sleep apnea, unspecified: G47.30

## 2022-06-23 HISTORY — DX: Atherosclerotic heart disease of native coronary artery without angina pectoris: I25.10

## 2022-06-23 NOTE — Patient Instructions (Addendum)
Your procedure is scheduled on: 07/03/22 - Monday Report to the Registration Desk on the 1st floor of the [facility]. To find out your arrival time, please call [phone] between 1PM - 3PM on: 06/30/22 - Friday If your arrival time is 6:00 am, do not arrive prior to that time as the [facility] entrance doors do not open until 6:00 am.  REMEMBER: Instructions that are not followed completely may result in serious medical risk, up to and including death; or upon the discretion of your surgeon and anesthesiologist your surgery may need to be rescheduled.  Do not eat food or drink any fluids after midnight the night before surgery.  No gum chewing, lozengers or hard candies.  TAKE ONLY THESE MEDICATIONS THE MORNING OF SURGERY WITH A SIP OF WATER:  - metoprolol succinate (TOPROL-XL) 25 MG  - atorvastatin (LIPITOR)  - finasteride (PROSCAR) - tamsulosin (FLOMAX)    STOP taking beginning 06/30/22 John Hanna - resume taking with doctor order.  One week prior to surgery: Stop Anti-inflammatories (NSAIDS) such as Advil, Aleve, Ibuprofen, Motrin, Naproxen, Naprosyn and Aspirin based products such as Excedrin, Goodys Powder, BC Powder.    Stop ANY OVER THE COUNTER supplements until after surgery.  You may however, continue to take Tylenol if needed for pain up until the day of surgery.  No Alcohol for 24 hours before or after surgery.  No Smoking including e-cigarettes for 24 hours prior to surgery.  No chewable tobacco products for at least 6 hours prior to surgery.  No nicotine patches on the day of surgery.  Do not use any "recreational" drugs for at least a week prior to your surgery.  Please be advised that the combination of cocaine and anesthesia may have negative outcomes, up to and including death. If you test positive for cocaine, your surgery will be cancelled.  On the morning of surgery brush your teeth with toothpaste and water, you may rinse your mouth with mouthwash if  you wish. Do not swallow any toothpaste or mouthwash.  Do not wear jewelry, make-up, hairpins, clips or nail polish.  Do not wear lotions, powders, or perfumes.   Do not shave body from the neck down 48 hours prior to surgery just in case you cut yourself which could leave a site for infection.  Also, freshly shaved skin may become irritated if using the CHG soap.  Contact lenses, hearing aids and dentures may not be worn into surgery.  Do not bring valuables to the hospital. [facility] is not responsible for any missing/lost belongings or valuables.   Bring your C-PAP to the hospital with you in case you may have to spend the night.   Notify your doctor if there is any change in your medical condition (cold, fever, infection).  Wear comfortable clothing (specific to your surgery type) to the hospital.  After surgery, you can help prevent lung complications by doing breathing exercises.  Take deep breaths and cough every 1-2 hours. Your doctor may order a device called an Incentive Spirometer to help you take deep breaths. When coughing or sneezing, hold a pillow firmly against your incision with both hands. This is called "splinting." Doing this helps protect your incision. It also decreases belly discomfort.  If you are being admitted to the hospital overnight, leave your suitcase in the car. After surgery it may be brought to your room.  If you are being discharged the day of surgery, you will not be allowed to drive home. You  will need a responsible adult (18 years or older) to drive you home and stay with you that night.   If you are taking public transportation, you will need to have a responsible adult (18 years or older) with you. Please confirm with your physician that it is acceptable to use public transportation.   Please call the [facility] Dept. at [phone] if you have any questions about these instructions.  Surgery Visitation Policy:  Patients  undergoing a surgery or procedure may have two family members or support persons with them as long as the person is not COVID-19 positive or experiencing its symptoms.   Inpatient Visitation:    Visiting hours are 7 a.m. to 8 p.m. Up to four visitors are allowed at one time in a patient room, including children. The visitors may rotate out with other people during the day. One designated support person (adult) may remain overnight.

## 2022-06-24 LAB — CULTURE, URINE COMPREHENSIVE

## 2022-06-26 ENCOUNTER — Encounter: Payer: Self-pay | Admitting: Urology

## 2022-06-26 ENCOUNTER — Telehealth: Payer: Self-pay

## 2022-06-26 MED ORDER — NITROFURANTOIN MONOHYD MACRO 100 MG PO CAPS: 100.0000 mg | ORAL_CAPSULE | Freq: Two times a day (BID) | ORAL | 0 refills | Status: DC

## 2022-06-26 NOTE — Telephone Encounter (Signed)
Spoke with pt. Pt. Advised of need to start medication. Medication sent in to [facility] per patient request.

## 2022-06-26 NOTE — Progress Notes (Signed)
Perioperative Services  Pre-Admission/Anesthesia Testing Clinical Review  Date: 06/29/22  Patient Demographics:  Name: John Hanna DOB:   12/13/1960 MRN:   [id number]  Planned Surgical Procedure(s):    Case: [id number] Date/Time: 07/03/22 0730   Procedure: HOLEP-LASER ENUCLEATION OF THE PROSTATE WITH MORCELLATION   Anesthesia type: General   Pre-op diagnosis: Benign Prostatic Hyperplasia with Urinary Retention   Location: [facility] OR ROOM 10 / [facility] ORS FOR ANESTHESIA GROUP   Surgeons: [name], MD   NOTE: Available PAT nursing documentation and vital signs have been reviewed. Clinical nursing staff has updated patient's PMH/PSHx, current medication list, and drug allergies/intolerances to ensure comprehensive history available to assist in medical decision making as it pertains to the aforementioned surgical procedure and anticipated anesthetic course. Extensive review of available clinical information performed. [place] PMH and PSHx updated with any diagnoses/procedures that  may have been inadvertently omitted during his intake with the pre-admission testing department's nursing staff.  Clinical Discussion:  John Hanna is a 61 y.o. male who is submitted for pre-surgical anesthesia review and clearance prior to him undergoing the above procedure. Patient is a Former Smoker (2.5 pack years; quit 10/1987). Pertinent PMH includes: CAD, atrial fibrillation, PSVT, tachycardia/bradycardia syndrome, palpitations, diastolic dysfunction, HTN, HLD, SOB/DOE, OSAH (requires nocturnal PAP therapy), OA, BPH.  Patient is followed by cardiology Omelia Blackwater, MD). He was last seen in the cardiology clinic on 06/07/2022; notes reviewed.  At the time of his clinic visit, patient "feeling well" from a cardiovascular perspective.  He denied any episodes of chest pain, shortness of breath, PND, orthopnea, palpitations, significant peripheral edema, vertiginous symptoms, or presyncope/syncope.  Patient  with past medical history significant for cardiovascular diagnoses.  Diagnostic LEFT heart catheterization was performed on 07/15/2020 revealing multivessel nonobstructive CAD; 20% ostial proximal LAD, 15% proximal LM, and 20% mid LM.  Given the nonobstructive nature of noted disease, intervention was deferred opting for medical management.  Myocardial perfusion imaging study was performed on 04/24/2019 revealing a mildly reduced left ventricular systolic function with an EF of 46%.  There were no concerns for stress-induced myocardial ischemia or arrhythmia.  Study determined to be low risk.  TTE performed on 04/24/2019 revealed mild reduced left ventricular systolic function with an EF of 45%.  There was global hypokinesis. Diastolic Doppler parameters consistent with abnormal relaxation (G1DD). Mild left atrial enlargement noted.  There was trivial pulmonic valve, in addition to mild mitral and tricuspid valve regurgitation.  Cardiac MRI performed on 10/15/2019 revealed a normal left ventricular systolic function with an EF of 55%.  There was mild biatrial enlargement.  There was basal to mid inferolateral perfusion defect consistent with ischemia and the OM of the LCx or PLV of the RCA.  There was no late gadolinium enhancement to suggest myocardial infarction, scarring, or infiltrative process.  Patient with an atrial fibrillation diagnosis; CHA2DS2-VASc Score = 2 (age, HTN). Patient underwent [facility] PVI procedure on 07/20/2020.  Given thickness of ridge, an added line of ablation just anterior to the ligament of [place] Cancer was performed.  His rate and rhythm are currently being maintained on oral metoprolol succinate. He is chronically anticoagulated using rivaroxaban; reported to be compliant with therapy with no evidence or reports of GI bleeding.  Blood pressure well controlled at 120/70 on currently prescribed beta-blocker (metoprolol succinate), ARB (valsartan), and diuretic (HCTZ) therapies. He is  on a atorvastatin for his HLD diagnosis and further ASCVD prevention.  Patient is not diabetic.  He does have an OSAH diagnosis and is reportedly  compliant with prescribed nocturnal PAP therapy. Functional capacity, as defined by DASI, is documented as being >/= 4 METS.  No changes were made to his medication regimen.  Patient to follow-up with outpatient cardiology in 1 year or sooner if needed.  Jaegar Croft is scheduled for a HOLEP-LASER ENUCLEATION OF THE PROSTATE WITH MORCELLATION on 07/03/2022 with Dr. [name], MD.  Given patient's past medical history significant for cardiovascular diagnoses, presurgical cardiac clearance was sought by the PAT team.  Per cardiology, "patient feels well from a cardiac perspective.  Recent CBC and creatinine were normal.  Patient cleared from a cardiac perspective to proceed with planned urological procedure at an overall ACCEPTABLE risk of significant perioperative cardiovascular complications".  Again, this patient is on daily anticoagulation therapy.  He has been advised of recommendations from his cardiologist for holding his rivaroxaban dose for 3 days prior to his procedure with plans to restart as soon as postoperative bleeding respectively minimized by his primary attending surgeon.  The patient is aware that his last dose of rivaroxaban should be on 06/29/2022.  Patient denies previous perioperative complications with anesthesia in the past. In review of the available records, it is noted that patient underwent a general anesthetic course at [facility] in 07/2020 without documented complications.      05/21/2022   12:41 PM 05/21/2022   12:40 PM 05/18/2022    9:40 AM  Vitals with BMI  Height  6' 1"  6' 1"   Weight  239 lbs 239 lbs  BMI  35.59 74.16  Systolic 384  536  Diastolic 81  83  Pulse 61  54    Providers/Specialists:   NOTE: Primary physician provider listed below. Patient may have been seen by APP or partner  within same practice.   PROVIDER ROLE / SPECIALTY LAST Lu Duffel, MD Urology (Surgeon) 06/20/2022  [name], MD Primary Care Provider 03/06/2022  Westly Pam, MD Cardiology 06/07/2022   Allergies:  Cyclobenzaprine  Current Home Medications:   No current facility-administered medications for this encounter.    atorvastatin (LIPITOR) 40 MG tablet   finasteride (PROSCAR) 5 MG tablet   metoprolol succinate (TOPROL-XL) 25 MG 24 hr tablet   nitrofurantoin, macrocrystal-monohydrate, (MACROBID) 100 MG capsule   NON FORMULARY   OVER THE COUNTER MEDICATION   tamsulosin (FLOMAX) 0.4 MG CAPS capsule   valsartan-hydrochlorothiazide (DIOVAN-HCT) 320-25 MG tablet   XARELTO 20 MG TABS tablet   History:   Past Medical History:  Diagnosis Date   Acute urinary retention 07/13/2020   Arthritis    Atrial fibrillation (HCC)    a.) CHA2DS2VASc = 2 (age, HTN);  b.) 07/20/2020 s/p WACA PVI ([place]) with additional line of ablation just anterior to ligament of [place] given thickness of ridge;  c.) rate/rhythm maintained on oral metoprolol succinate; chronically anticoagulated with rivaroxaban   BPH (benign prostatic hyperplasia)    Colon polyps    Coronary artery disease 07/15/2020   a.) LHC 07/15/2020: 20% o-pLAD, 15% pLM, 20% mLM - med mgmt; b.) MPI 04/24/2019: EF 46%, no ischemic changes; c.) cMRI 10/15/2019: EF 55%, mild BAE, no LGE, bas-mid inferolateral perfusion defect c/w ischemia in OM of LCx or PLB of RCA.   Diastolic dysfunction 46/80/3212   a.) TTE 04/24/2019: EF 45%, glob HK, mild LAE, triv PR, mild MR/TR, G1DD   DOE (dyspnea on exertion)    HLD (hyperlipidemia)    Hx of skin cancer, basal cell    Hypertension  LBBB (left bundle branch block)    Long term current use of anticoagulant    a.) rivaroxaban   OSA on CPAP    Palpitations    Plantar fasciitis    PSVT (paroxysmal supraventricular tachycardia) (HCC)    SOB (shortness of breath)     Tachycardia-bradycardia syndrome ([place])    Past Surgical History:  Procedure Laterality Date   CARDIAC ELECTROPHYSIOLOGY STUDY AND ABLATION  07/20/2020   COLONOSCOPY  01/2011   repeat 3 yrs - @ [place] internal [place] CATH AND CORONARY ANGIOGRAPHY N/A 07/15/2020   Procedure: LEFT HEART CATH AND CORONARY ANGIOGRAPHY;  Surgeon: Corey Skains, MD;  Location: [facility];  Service: Cardiovascular;  Laterality: N/A;   Family History  Problem Relation Age of Onset   Colon cancer Mother        died age 68   Hypertension Father    Prostate cancer Father 76   Social History   Tobacco Use   Smoking status: Former    Packs/day: 0.50    Years: 5.00    Total pack years: 2.50    Types: Cigarettes    Quit date: 10/17/1987    Years since quitting: 34.7   Smokeless tobacco: Never  Vaping Use   Vaping Use: Never used  Substance Use Topics   Alcohol use: No    Alcohol/week: 0.0 standard drinks of alcohol   Drug use: Never    Pertinent Clinical Results:  LABS: Labs reviewed: Acceptable for surgery.  Lab Results  Component Value Date   WBC 10.2 05/21/2022   HGB 13.8 05/21/2022   HCT 42.1 05/21/2022   MCV 89.6 05/21/2022   PLT 271 05/21/2022   Lab Results  Component Value Date   NA 140 05/21/2022   K 3.3 (L) 05/21/2022   CO2 24 05/21/2022   GLUCOSE 103 (H) 05/21/2022   BUN 14 05/21/2022   CREATININE 1.20 05/21/2022   CALCIUM 9.4 05/21/2022   EGFR 76 03/06/2022   GFRNONAA >60 05/21/2022    ECG: Date: 06/07/2022 Rate: 59 bpm Rhythm: sinus bradycardia Axis (leads I and aVF): Normal Intervals: PR 136 ms. QRS 98 ms. QTc 437 ms. ST segment and T wave changes: No evidence of acute ST segment elevation or depression.  Evidence of early precordial QRS transition. Comparison: Similar to previous tracing obtained on 07/04/2021 NOTE: Tracing obtained at [facility]; unable for review. Above based on cardiologist's interpretation.     IMAGING / PROCEDURES: LONG TERM CARDIAC EVENT MONITOR STUDY performed on 12/01/2020 Predominant underlying rhythm was sinus bradycardia to sinus tachycardia with an average heart rate of 62 bpm; range 40-129 bpm 9237 PVCs with a burden of 0.73%.  Couplets and trigeminy were present. 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  2 pulmonary veins entering the LEFT atrium on the RIGHT.  Single, and LEFT sided pulmonary venous ostium Suboptimal opacification of the LEFT atrium and LEFT atrial appendage.  No visible thrombus within this limitation  CARDIAC MRI HEART STRESS MORPHOLOGY AND FUNCTION WITH AND WITHOUT CONTRAST performed on 10/15/2019 The left ventricle is normal in cavity size and  wall thickness.  During sinus beats, there are no regional wall motion abnormalities.  Global systolic function is normal.  The LVEF is visually estimated at 55%.  The right ventricle is normal in cavity size, wall thickness, and systolic function.  The left atrium is moderately dilated.  The right atrium is mildly dilated. The aortic valve is trileaflet in morphology. There is no significant aortic stenosis or regurgitation.  There is no evidence of valvular stenosis. Regurgitant valvular disease was not well assessed due to the need for real time imaging (employed due to the presence of frequent PACS/PVCS)  Delayed enhancement imaging demonstrates no evidence of myocardial infarction, scarring, or  infiltration..  Adenosine stress perfusion imaging is abnormal.  There are perfusion defects involving the basal-mid  inferolateral wall, consistent with inducible myocardial ischemia in the distribution of a distal obtuse marginal branch of the LCx or a PL branch of the RCA.  There is no evidence of an intracardiac thrombus.  The pericardium is normal in thickness.  There is no significant pericardial effusion.   MYOCARDIAL PERFUSION IMAGING STUDY (LEXISCAN) performed on 04/24/2019 Mildly reduced left ventricular systolic function with an EF of 46%  normal myocardial thickening and wall motion left ventricular cavity size normal No artifact SPECT images demonstrate homogenous tracer distribution throughout the myocardium With exercise patient developed what appeared to be atrial tachycardia versus atrial fibrillation.  Arrhythmia improved with oral metoprolol. No obvious ischemic changes; no reversible ischemia noted on sestamibi images  TRANSTHORACIC ECHOCARDIOGRAM performed on 04/24/2019 Mildly reduced left ventricular systolic function with an EF of 45% Normal right ventricular systolic function trivial PR Mild MR and TR No AR  Normal gradients; no valvular stenosis  No pericardial effusion   Impression and Plan:  Izyan Ezzell has been referred for pre-anesthesia review and clearance prior to him undergoing the planned anesthetic and procedural courses. Available labs, pertinent testing, and imaging results were personally reviewed by me. This patient has been appropriately cleared by cardiology with an overall ACCEPTABLE risk of significant perioperative cardiovascular complications.  Based on clinical review performed today (06/29/22), barring any significant acute changes in the patient's overall condition, it is anticipated that he will be able to proceed with the planned surgical intervention. Any acute changes in clinical condition may necessitate his procedure being postponed and/or cancelled. Patient will meet with anesthesia team (MD and/or CRNA) on the day of his procedure for preoperative  evaluation/assessment. Questions regarding anesthetic course will be fielded at that time.   Pre-surgical instructions were reviewed with the patient during his PAT appointment and questions were fielded by PAT clinical staff. Patient was advised that if any questions or concerns arise prior to his procedure then he should return a call to PAT and/or his surgeon's office to discuss.  Honor Loh, MSN, APRN, FNP-C, CEN [facility]  Peri-operative Services Nurse Practitioner Phone: [phone] Fax: [phone] 06/29/22 2:37 PM  NOTE: This note has been prepared using Dragon dictation software. Despite my best ability to proofread, there is always the potential that unintentional transcriptional errors may still occur from this process.

## 2022-06-26 NOTE — Telephone Encounter (Signed)
-----   Message from [name], MD sent at 06/26/2022  2:46 PM EDT ----- Preop urine cultures positive for Staph epidermidis.  Please treat with Macrobid twice daily for 10 days starting 7 days before the procedure.  [name], MD

## 2022-06-29 ENCOUNTER — Encounter: Payer: Self-pay | Admitting: Urology

## 2022-07-02 MED ORDER — LACTATED RINGERS IV SOLN: INTRAVENOUS | Status: DC

## 2022-07-02 MED ORDER — ORAL CARE MOUTH RINSE: 15.0000 mL | Freq: Once | OROMUCOSAL | Status: AC

## 2022-07-02 MED ORDER — FAMOTIDINE 20 MG PO TABS: 20.0000 mg | ORAL_TABLET | Freq: Once | ORAL | Status: AC

## 2022-07-02 MED ORDER — CHLORHEXIDINE GLUCONATE 0.12 % MT SOLN: 15.0000 mL | Freq: Once | OROMUCOSAL | Status: AC

## 2022-07-02 MED ORDER — CEFAZOLIN SODIUM-DEXTROSE 2-4 GM/100ML-% IV SOLN
2.0000 g | INTRAVENOUS | Status: AC
  Administered 2022-07-03: 2 g via INTRAVENOUS

## 2022-07-03 ENCOUNTER — Other Ambulatory Visit: Payer: Self-pay

## 2022-07-03 ENCOUNTER — Encounter
Admission: RE | Disposition: A | Discharge status: Home / Self Care | Payer: Self-pay | Source: Home / Self Care | Attending: Urology

## 2022-07-03 ENCOUNTER — Encounter: Payer: Self-pay | Admitting: Urology

## 2022-07-03 ENCOUNTER — Ambulatory Visit
Admission: RE | Admit: 2022-07-03 | Discharge: 2022-07-03 | Disposition: A | Discharge status: Home / Self Care | Payer: BC Managed Care – PPO | Attending: Urology | Admitting: Urology

## 2022-07-03 ENCOUNTER — Ambulatory Visit: Payer: BC Managed Care – PPO | Admitting: Urgent Care

## 2022-07-03 DIAGNOSIS — I471 Supraventricular tachycardia: Secondary | ICD-10-CM | POA: Diagnosis not present

## 2022-07-03 DIAGNOSIS — R338 Other retention of urine: Secondary | ICD-10-CM | POA: Insufficient documentation

## 2022-07-03 DIAGNOSIS — N138 Other obstructive and reflux uropathy: Secondary | ICD-10-CM

## 2022-07-03 DIAGNOSIS — Z79899 Other long term (current) drug therapy: Secondary | ICD-10-CM | POA: Insufficient documentation

## 2022-07-03 DIAGNOSIS — R0600 Dyspnea, unspecified: Secondary | ICD-10-CM | POA: Insufficient documentation

## 2022-07-03 DIAGNOSIS — I10 Essential (primary) hypertension: Secondary | ICD-10-CM | POA: Insufficient documentation

## 2022-07-03 DIAGNOSIS — G4730 Sleep apnea, unspecified: Secondary | ICD-10-CM | POA: Insufficient documentation

## 2022-07-03 DIAGNOSIS — I2510 Atherosclerotic heart disease of native coronary artery without angina pectoris: Secondary | ICD-10-CM | POA: Diagnosis not present

## 2022-07-03 DIAGNOSIS — N3289 Other specified disorders of bladder: Secondary | ICD-10-CM | POA: Diagnosis not present

## 2022-07-03 DIAGNOSIS — N401 Enlarged prostate with lower urinary tract symptoms: Secondary | ICD-10-CM

## 2022-07-03 HISTORY — DX: Long term (current) use of anticoagulants: Z79.01

## 2022-07-03 HISTORY — DX: Plantar fascial fibromatosis: M72.2

## 2022-07-03 HISTORY — DX: Polyp of colon: K63.5

## 2022-07-03 HISTORY — PX: HOLEP-LASER ENUCLEATION OF THE PROSTATE WITH MORCELLATION: SHX6641

## 2022-07-03 HISTORY — DX: Supraventricular tachycardia: I47.1

## 2022-07-03 HISTORY — DX: Palpitations: R00.2

## 2022-07-03 HISTORY — DX: Left bundle-branch block, unspecified: I44.7

## 2022-07-03 HISTORY — DX: Other forms of dyspnea: R06.09

## 2022-07-03 HISTORY — DX: Shortness of breath: R06.02

## 2022-07-03 HISTORY — DX: Benign prostatic hyperplasia without lower urinary tract symptoms: N40.0

## 2022-07-03 HISTORY — DX: Obstructive sleep apnea (adult) (pediatric): G47.33

## 2022-07-03 HISTORY — DX: Hyperlipidemia, unspecified: E78.5

## 2022-07-03 HISTORY — DX: Supraventricular tachycardia, unspecified: I47.10

## 2022-07-03 HISTORY — DX: Sick sinus syndrome: I49.5

## 2022-07-03 HISTORY — DX: Personal history of other malignant neoplasm of skin: Z85.828

## 2022-07-03 SURGERY — ENUCLEATION, PROSTATE, USING LASER, WITH MORCELLATION
Anesthesia: General

## 2022-07-03 MED ORDER — ONDANSETRON HCL 4 MG/2ML IJ SOLN: 4.0000 mg | Freq: Once | INTRAMUSCULAR | Status: DC | PRN

## 2022-07-03 MED ORDER — STERILE WATER FOR IRRIGATION IR SOLN
Status: DC | PRN
  Administered 2022-07-03: 1000 mL

## 2022-07-03 MED ORDER — DEXAMETHASONE SODIUM PHOSPHATE 10 MG/ML IJ SOLN
INTRAMUSCULAR | Status: DC | PRN
  Administered 2022-07-03: 10 mg via INTRAVENOUS

## 2022-07-03 MED ORDER — PROPOFOL 10 MG/ML IV BOLUS
INTRAVENOUS | Status: DC | PRN
  Administered 2022-07-03: 150 mg via INTRAVENOUS

## 2022-07-03 MED ORDER — CHLORHEXIDINE GLUCONATE 0.12 % MT SOLN
OROMUCOSAL | Status: AC
  Administered 2022-07-03: 15 mL via OROMUCOSAL
  Filled 2022-07-03: qty 15

## 2022-07-03 MED ORDER — FUROSEMIDE 10 MG/ML IJ SOLN
INTRAMUSCULAR | Status: DC | PRN
  Administered 2022-07-03: 10 mg via INTRAMUSCULAR

## 2022-07-03 MED ORDER — MIDAZOLAM HCL 2 MG/2ML IJ SOLN
INTRAMUSCULAR | Status: AC
  Filled 2022-07-03: qty 2

## 2022-07-03 MED ORDER — SUGAMMADEX SODIUM 200 MG/2ML IV SOLN
INTRAVENOUS | Status: DC | PRN
  Administered 2022-07-03: 200 mg via INTRAVENOUS

## 2022-07-03 MED ORDER — HYDROCODONE-ACETAMINOPHEN 5-325 MG PO TABS: 1.0000 | ORAL_TABLET | Freq: Four times a day (QID) | ORAL | 0 refills | Status: DC | PRN

## 2022-07-03 MED ORDER — FENTANYL CITRATE (PF) 100 MCG/2ML IJ SOLN
INTRAMUSCULAR | Status: AC
  Filled 2022-07-03: qty 2

## 2022-07-03 MED ORDER — SUCCINYLCHOLINE CHLORIDE 200 MG/10ML IV SOSY
PREFILLED_SYRINGE | INTRAVENOUS | Status: DC | PRN
  Administered 2022-07-03: 140 mg via INTRAVENOUS

## 2022-07-03 MED ORDER — OXYBUTYNIN CHLORIDE 5 MG PO TABS: 5.0000 mg | ORAL_TABLET | Freq: Three times a day (TID) | ORAL | 0 refills | Status: DC | PRN

## 2022-07-03 MED ORDER — MIDAZOLAM HCL 2 MG/2ML IJ SOLN
INTRAMUSCULAR | Status: DC | PRN
  Administered 2022-07-03: 2 mg via INTRAVENOUS

## 2022-07-03 MED ORDER — CEFAZOLIN SODIUM-DEXTROSE 2-4 GM/100ML-% IV SOLN
INTRAVENOUS | Status: AC
  Filled 2022-07-03: qty 100

## 2022-07-03 MED ORDER — SEVOFLURANE IN SOLN
RESPIRATORY_TRACT | Status: AC
  Filled 2022-07-03: qty 250

## 2022-07-03 MED ORDER — FENTANYL CITRATE (PF) 100 MCG/2ML IJ SOLN
INTRAMUSCULAR | Status: DC | PRN
  Administered 2022-07-03 (×2): 50 ug via INTRAVENOUS

## 2022-07-03 MED ORDER — FENTANYL CITRATE (PF) 100 MCG/2ML IJ SOLN
25.0000 ug | INTRAMUSCULAR | Status: DC | PRN
  Administered 2022-07-03: 25 ug via INTRAVENOUS

## 2022-07-03 MED ORDER — ONDANSETRON HCL 4 MG/2ML IJ SOLN
INTRAMUSCULAR | Status: DC | PRN
  Administered 2022-07-03: 4 mg via INTRAVENOUS

## 2022-07-03 MED ORDER — FAMOTIDINE 20 MG PO TABS
ORAL_TABLET | ORAL | Status: AC
  Administered 2022-07-03: 20 mg via ORAL
  Filled 2022-07-03: qty 1

## 2022-07-03 MED ORDER — PHENYLEPHRINE HCL (PRESSORS) 10 MG/ML IV SOLN
INTRAVENOUS | Status: DC | PRN
  Administered 2022-07-03 (×2): 80 ug via INTRAVENOUS

## 2022-07-03 MED ORDER — ACETAMINOPHEN 10 MG/ML IV SOLN
INTRAVENOUS | Status: DC | PRN
  Administered 2022-07-03: 1000 mg via INTRAVENOUS

## 2022-07-03 MED ORDER — SODIUM CHLORIDE 0.9 % IR SOLN
Status: DC | PRN
  Administered 2022-07-03 (×3): 12000 mL via INTRAVESICAL

## 2022-07-03 MED ORDER — ROCURONIUM BROMIDE 100 MG/10ML IV SOLN
INTRAVENOUS | Status: DC | PRN
  Administered 2022-07-03 (×2): 20 mg via INTRAVENOUS
  Administered 2022-07-03: 50 mg via INTRAVENOUS

## 2022-07-03 SURGICAL SUPPLY — 36 items
ADAPTER IRRIG TUBE 2 SPIKE SOL (ADAPTER) ×2 IMPLANT
ADPR TBG 2 SPK PMP STRL ASCP (ADAPTER) ×2
BAG DRN LRG CPC RND TRDRP CNTR (MISCELLANEOUS)
BAG DRN RND TRDRP ANRFLXCHMBR (UROLOGICAL SUPPLIES)
BAG URINE DRAIN 2000ML AR STRL (UROLOGICAL SUPPLIES) IMPLANT
BAG URO DRAIN 4000ML (MISCELLANEOUS) IMPLANT
CATH FOL 2WAY LX 22X30 (CATHETERS) IMPLANT
CATH URETL OPEN 5X70 (CATHETERS) ×1 IMPLANT
CONTAINER COLLECT MORCELLATR (MISCELLANEOUS) ×1 IMPLANT
DRAPE 3/4 80X56 (DRAPES) ×1 IMPLANT
DRAPE UTILITY 15X26 TOWEL STRL (DRAPES) IMPLANT
FIBER LASER MOSES 550 DFL (Laser) IMPLANT
FILTER OVERFLOW MORCELLATOR (FILTER) ×2 IMPLANT
GAUZE 4X4 16PLY ~~LOC~~+RFID DBL (SPONGE) ×2 IMPLANT
GLOVE BIO SURGEON STRL SZ 6.5 (GLOVE) ×2 IMPLANT
GOWN STRL REUS W/ TWL LRG LVL3 (GOWN DISPOSABLE) ×2 IMPLANT
GOWN STRL REUS W/TWL LRG LVL3 (GOWN DISPOSABLE) ×2
HOLDER FOLEY CATH W/STRAP (MISCELLANEOUS) ×2 IMPLANT
IV NS IRRIG 3000ML ARTHROMATIC (IV SOLUTION) ×4 IMPLANT
KIT TURNOVER CYSTO (KITS) ×1 IMPLANT
MBRN O SEALING YLW 17 FOR INST (MISCELLANEOUS) ×1
MEMBRANE SLNG YLW 17 FOR INST (MISCELLANEOUS) ×2 IMPLANT
MORCELLATOR COLLECT CONTAINER (MISCELLANEOUS) ×1
MORCELLATOR OVERFLOW FILTER (FILTER) ×1
MORCELLATOR ROTATION 4.75 335 (MISCELLANEOUS) ×1 IMPLANT
PACK CYSTO AR (MISCELLANEOUS) ×1 IMPLANT
SET CYSTO W/LG BORE CLAMP LF (SET/KITS/TRAYS/PACK) IMPLANT
SET IRRIG Y TYPE TUR BLADDER L (SET/KITS/TRAYS/PACK) ×1 IMPLANT
SLEEVE PROTECTION STRL DISP (MISCELLANEOUS) ×2 IMPLANT
SURGILUBE 2OZ TUBE FLIPTOP (MISCELLANEOUS) ×1 IMPLANT
SYR TOOMEY IRRIG 70ML (MISCELLANEOUS) ×1
SYRINGE TOOMEY IRRIG 70ML (MISCELLANEOUS) ×2 IMPLANT
TRAP FLUID SMOKE EVACUATOR (MISCELLANEOUS) ×1 IMPLANT
TUBE PUMP MORCELLATOR PIRANHA (TUBING) ×1 IMPLANT
WATER STERILE IRR 1000ML POUR (IV SOLUTION) ×1 IMPLANT
WATER STERILE IRR 500ML POUR (IV SOLUTION) ×1 IMPLANT

## 2022-07-03 NOTE — OR Nursing (Addendum)
PER OR 10, DR. Lucienne Capers WILL SEE PT IN POSTOP ROOM AFTER CURRENT CASE AND WILL ADDRESS XARELTO RESUMPTION. Update 1108 - Per Dr. Erlene Quan, verbal via tele, she has added info regarding xarelto to d/c instructions; advises she does not need to see pt in postop prior to discharge. (Added at end of anesthesia instructions "[place]" - patient/spouse aware of same.

## 2022-07-03 NOTE — Anesthesia Procedure Notes (Signed)
Procedure Name: Intubation Date/Time: 07/03/2022 7:49 AM  Performed by: Lorie Apley, CRNAPre-anesthesia Checklist: Patient identified, Patient being monitored, Timeout performed, Emergency Drugs available and Suction available Patient Re-evaluated:Patient Re-evaluated prior to induction Oxygen Delivery Method: Circle system utilized Preoxygenation: Pre-oxygenation with 100% oxygen Induction Type: IV induction Ventilation: Mask ventilation without difficulty Laryngoscope Size: Mac, 3 and McGraph Grade View: Grade I Tube type: Oral Tube size: 7.5 mm Number of attempts: 1 Airway Equipment and Method: Stylet Placement Confirmation: ETT inserted through vocal cords under direct vision, positive ETCO2 and breath sounds checked- equal and bilateral Secured at: 23 cm Tube secured with: Tape Dental Injury: Teeth and Oropharynx as per pre-operative assessment  Comments: Poor dentition.

## 2022-07-03 NOTE — Transfer of Care (Signed)
Immediate Anesthesia Transfer of Care Note  Patient: John Hanna  Procedure(s) Performed: HOLEP-LASER ENUCLEATION OF THE PROSTATE WITH MORCELLATION  Patient Location: PACU  Anesthesia Type:General  Level of Consciousness: awake and drowsy  Airway & Oxygen Therapy: Patient Spontanous Breathing and Patient connected to face mask oxygen  Post-op Assessment: Report given to RN, Post -op Vital signs reviewed and stable and Patient moving all extremities  Post vital signs: Reviewed and stable  Last Vitals:  Vitals Value Taken Time  BP    Temp    Pulse 68 07/03/22 0952  Resp 13 07/03/22 0952  SpO2 100 % 07/03/22 0952  Vitals shown include unvalidated device data.  Last Pain:  Vitals:   07/03/22 0620  PainSc: 5          Complications: No notable events documented.

## 2022-07-03 NOTE — Anesthesia Preprocedure Evaluation (Signed)
Anesthesia Evaluation  Patient identified by MRN, date of birth, ID band Patient awake    Reviewed: Allergy & Precautions, H&P , NPO status , Patient's Chart, lab work & pertinent test results, reviewed documented beta blocker date and time   Airway Mallampati: II  TM Distance: >3 FB Neck ROM: full    Dental  (+) Teeth Intact   Pulmonary sleep apnea and Continuous Positive Airway Pressure Ventilation , former smoker,    Pulmonary exam normal        Cardiovascular Exercise Tolerance: Poor hypertension, On Medications + CAD and + DOE  + dysrhythmias Supra Ventricular Tachycardia  Rhythm:regular Rate:Normal     Neuro/Psych negative neurological ROS  negative psych ROS   GI/Hepatic negative GI ROS, Neg liver ROS,   Endo/Other  negative endocrine ROS  Renal/GU negative Renal ROS  negative genitourinary   Musculoskeletal   Abdominal   Peds  Hematology negative hematology ROS (+)   Anesthesia Other Findings Past Medical History: 07/13/2020: Acute urinary retention No date: Arthritis No date: Atrial fibrillation (HCC)     Comment:  a.) CHA2DS2VASc = 2 (age, HTN);  b.) 07/20/2020 s/p WACA              PVI ([place]) with additional line of ablation just               anterior to ligament of [place] given thickness of               ridge;  c.) rate/rhythm maintained on oral metoprolol               succinate; chronically anticoagulated with rivaroxaban No date: BPH (benign prostatic hyperplasia) No date: Colon polyps 07/15/2020: Coronary artery disease     Comment:  a.) LHC 07/15/2020: 20% o-pLAD, 15% pLM, 20% mLM - med               mgmt; b.) MPI 04/24/2019: EF 46%, no ischemic changes;               c.) cMRI 10/15/2019: EF 55%, mild BAE, no LGE, bas-mid               inferolateral perfusion defect c/w ischemia in OM of LCx               or PLB of RCA. 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[facility]) Past Surgical History: 07/20/2020: CARDIAC ELECTROPHYSIOLOGY STUDY AND ABLATION 01/2011: COLONOSCOPY     Comment:  repeat 3 yrs - @ [facility]: HERNIA REPAIR 07/15/2020: LEFT HEART CATH AND CORONARY ANGIOGRAPHY; N/A     Comment:  Procedure: LEFT HEART CATH AND CORONARY ANGIOGRAPHY;                Surgeon: Corey Skains, MD;  Location: [place]  [facility];  Service: Cardiovascular;  Laterality: N/A; BMI    Body Mass Index: 31.14 kg/m     Reproductive/Obstetrics negative OB ROS                             Anesthesia Physical Anesthesia Plan  ASA: 3  Anesthesia Plan: General ETT   Post-op Pain Management:    Induction:   PONV Risk Score and Plan:   Airway Management Planned:   Additional Equipment:   Intra-op Plan:   Post-operative Plan:   Informed Consent: I have reviewed the patients History and Physical, chart, labs and discussed the procedure including the risks, benefits and alternatives for the proposed anesthesia with the patient or authorized representative who has indicated his/her understanding and acceptance.     Dental Advisory Given  Plan Discussed with: CRNA  Anesthesia Plan Comments:         Anesthesia Quick Evaluation

## 2022-07-03 NOTE — Op Note (Signed)
Date of procedure: 07/03/22  Preoperative diagnosis:  BPH with BOO  Postoperative diagnosis:  same   Procedure: HoLEP with morcellation  Surgeon: [name], MD  Anesthesia: General  Complications: None  Intraoperative findings: Trilobar coaptation with bilobed median lobe.  Moderate trabeculation  EBL: 100 cc   Specimens: prostate chips  Drains: 32 French two-way Foley catheter with 50 cc in the balloon  Indication: John Hanna is a 61 y.o. patient with refractory urinary retention and has failed multiple voiding trials secondary to BPH with outlet obstruction.  After reviewing the management options for treatment, he elected to proceed with the above surgical procedure(s). We have discussed the potential benefits and risks of the procedure, side effects of the proposed treatment, the likelihood of the patient achieving the goals of the procedure, and any potential problems that might occur during the procedure or recuperation. Informed consent has been obtained.  Description of procedure:  The patient was taken to the operating room and general anesthesia was induced.  The patient was placed in the dorsal lithotomy position, prepped and draped in the usual sterile fashion, and preoperative antibiotics were administered. A preoperative time-out was performed.     A 26 French resectoscope sheath using a blunt angled obturator was introduced without difficulty into the bladder.  The bladder was carefully inspected and noted to be moderately trabeculated.  There is an elevated bladder neck with a very small intravesical component.  The trigone was able to be visualized with some manipulation and the UOs were UOs bladder neck itself.  The prostatic fossa had significant trilobar coaptation with greater than 5 cm prostatic length.  A 550 m laser fiber was then brought in and using settings of 1 J's and 60 Hz, 2 incisions were created at the 5:00 and 7:00 positions of the bladder  neck on either side of the median lobe down to the level of the bladder neck/capsular fibers.  The incision was carried down caudally meeting in the midline just above the verumontanum.  The median lobe was then enucleated from a caudal to cranial direction cleaving the adenoma off the underlying capsule rolling it towards the bladder neck and ultimately cleaving the mucosa to free the median lobe into the bladder.   Next, a semilunar incision was created at the prostatic apex on the left side again freeing up the adenoma from the underlying capsule.  Care was taken to avoid any resection past the verumontanum.  This incision was carried around laterally and cranially towards the bladder neck.  Ultimately, I was able to complete the anterior commissure mucosa and the adenoma into the bladder creating a widely patent prostatic fossa.     Next, the same similar incision was created at the right prostatic apex.  This adenoma however ended up being enucleated and more of a piece wise fashion freeing up a large BPH nodules from the capsular fibers.  Once this was completed and cleared from the bladder neck, the prostatic fossa was noted to be widely patent.  Hemostasis was achieved using hemostatic fiber settings.  Bilateral UOs were visualized and free of any injury.  Finally, the 46 French resectoscope was exchanged for nephroscope and using the Piranha handpiece morcellator, the bladder was distended in each of the prostate chips were evacuated.  The bladder was irrigated several times and smaller joules were clear for the bladder.  This point time, there were no residual fibers appreciated in the bladder.  Hemostasis was adequate.  10 mg of IV  Lasix was administered to help with postoperative diuresis.  A 22 French two-way Foley catheter was then inserted over a catheter guide with 50cc in the balloon.  The catheter irrigated easily and well.  Patient was then clean and dry, repositioned supine position, reversed  from anesthesia, taken to PACU in stable condition.   Plan: Patient will return to the office in 2 days for voiding trial.   IPSS/ PVR in 6 weeks.    [name], M.D.

## 2022-07-03 NOTE — Discharge Instructions (Addendum)
Holmium Laser Enucleation of the Prostate (HoLEP)  HoLEP is a treatment for men with benign prostatic hyperplasia (BPH). The laser surgery removed blockages of urine flow, and is done without any incisions on the body.     What is HoLEP?  HoLEP is a type of laser surgery used to treat obstruction (blockage) of urine flow as a result of benign prostatic hyperplasia (BPH). In men with BPH, the prostate gland is not cancerous, but has become enlarged. An enlarged prostate can result in a number of urinary tract symptoms such as weak urinary stream, difficulty in starting urination, inability to urinate, frequent urination, or getting up at night to urinate.  HoLEP was developed in the 1990's as a more effective and less expensive surgical option for BPH, compared to other surgical options such as laser vaporization(PVP/greenlight laser), transurethral resection of the prostate(TURP), and open simple prostatectomy.   What happens during a HoLEP?  HoLEP requires general anesthesia ("asleep" throughout the procedure).   An antibiotic is given to reduce the risk of infection  A surgical instrument called a resectoscope is inserted through the urethra (the tube that carries urine from the bladder). The resectoscope has a camera that allows the surgeon to view the internal structure of the prostate gland, and to see where the incisions are being made during surgery.  The laser is inserted into the resectoscope and is used to enucleate (free up) the enlarged prostate tissue from the capsule (outer shell) and then to seal up any blood vessels. The tissue that has been removed is pushed back into the bladder.  A morcellator is placed through the resectoscope, and is used to suction out the prostate tissue that has been pushed into the bladder.  When the prostate tissue has been removed, the resectoscope is removed, and a foley catheter is placed to allow healing and drain the urine from the  bladder.     What happens after a HoLEP?  More than 90% of patients go home the same day a few hours after surgery. Less than 10% will be admitted to the hospital overnight for observation to monitor the urine, or if they have other medical problems.  Fluid is flushed through the catheter for about 1 hour after surgery to clear any blood from the urine. It is normal to have some blood in the urine after surgery. The need for blood transfusion is extremely rare.  Eating and drinking are permitted after the procedure once the patient has fully awakened from anesthesia.  The catheter is usually removed 2-3 days after surgery- the patient will come to clinic to have the catheter removed and make sure they can urinate on their own.  It is very important to drink lots of fluids after surgery for one week to keep the bladder flushed.  At first, there may be some burning with urination, but this typically improved within a few hours to days. Most patients do not have a significant amount of pain, and narcotic pain medications are rarely needed.  Symptoms of urinary frequency, urgency, and even leakage are NORMAL for the first few weeks after surgery as the bladder adjusts after having to work hard against blockage from the prostate for many years. This will improve, but can sometimes take several months.  The use of pelvic floor exercises (Kegel exercises) can help improve problems with urinary incontinence.   After catheter removal, patients will be seen at 6 weeks and 6 months for symptom check  No heavy lifting for   at least 2-3 weeks after surgery, however patients can walk and do light activities the first day after surgery. Return to work time depends on occupation.    What are the advantages of HoLEP?  HoLEP has been studied in many different parts of the world and has been shown to be a safe and effective procedure. Although there are many types of BPH surgeries available, HoLEP offers a  unique advantage in being able to remove a large amount of tissue without any incisions on the body, even in very large prostates, while decreasing the risk of bleeding and providing tissue for pathology (to look for cancer). This decreases the need for blood transfusions during surgery, minimizes hospital stay, and reduces the risk of needing repeat treatment.  What are the side effects of HoLEP?  Temporary burning and bleeding during urination. Some blood may be seen in the urine for weeks after surgery and is part of the healing process.  Urinary incontinence (inability to control urine flow) is expected in all patients immediately after surgery and they should wear pads for the first few days/weeks. This typically improves over the course of several weeks. Performing Kegel exercises can help decrease leakage from stress maneuvers such as coughing, sneezing, or lifting. The rate of long term leakage is very low. Patients may also have leakage with urgency and this may be treated with medication. The risk of urge incontinence can be dependent on several factors including age, prostate size, symptoms, and other medical problems.  Retrograde ejaculation or "backwards ejaculation." In 75% of cases, the patient will not see any fluid during ejaculation after surgery.  Erectile function is generally not significantly affected.   What are the risks of HoLEP?  Injury to the urethra or development of scar tissue at a later date  Injury to the capsule of the prostate (typically treated with longer catheterization).  Injury to the bladder or ureteral orifices (where the urine from the kidney drains out)  Infection of the bladder, testes, or kidneys  Return of urinary obstruction at a later date requiring another operation (<2%)  Need for blood transfusion or re-operation due to bleeding  Failure to relieve all symptoms and/or need for prolonged catheterization after surgery  5-15% of patients are  found to have previously undiagnosed prostate cancer in their specimen. Prostate cancer can be treated after HoLEP.  Standard risks of anesthesia including blood clots, heart attacks, etc  When should I call my doctor?  Fever over 101.3 degrees  Inability to urinate, or large blood clots in the urine  AMBULATORY SURGERY  DISCHARGE INSTRUCTIONS   The drugs that you were given will stay in your system until tomorrow so for the next 24 hours you should not:  Drive an automobile Make any legal decisions Drink any alcoholic beverage   You may resume regular meals tomorrow.  Today it is better to start with liquids and gradually work up to solid foods.  You may eat anything you prefer, but it is better to start with liquids, then soup and crackers, and gradually work up to solid foods.   Please notify your doctor immediately if you have any unusual bleeding, trouble breathing, redness and pain at the surgery site, drainage, fever, or pain not relieved by medication.    Additional Instructions:        Please contact your physician with any problems or Same Day Surgery at [phone], Monday through Friday 6 am to 4 pm, or [place] at [facility] number  at [phone]. \    Hold your Xarelto until your urine is clear.

## 2022-07-03 NOTE — H&P (Signed)
H&P up-to-date Regular rate and rhythm Clear to auscultation bilaterally Foley catheter back in place, back in retention  John Hanna [birth year] [id number]   Referring provider:  [name], MD [address] [place],  [place] [address]    Chief Complaint  Patient presents with   Urinary Retention          HPI: John Hanna is a 61 y.o.male  with refractory urinary symptoms related to BPH who presents today for further evaluation of urinary retention.   Patient underwent cystoscopy 11/03/2020, retroflexion showed large irregular intravesical median lobe. He also had a prostate transrectal ultrasound sizing which revealed a 145.75 gm prostate measuring 6.53 x 5.81 x 7.34 cm (length. No significant hypoechoic or median lobe noted    He presents today with a Foley catheter that was placed about a week ago.  He was traveling at [facility] and ended up having significant difficulty emptying his bladder along with some dysuria.  Initially he was seen in urgent care started on Cipro but his symptoms did not resolve.  Ultimately he ended up at [facility] emergency room where Foley catheter was placed.  He reports no labs were done.  He reports that over a liter came out upon Foley catheter placement.   He has a personal history of urinary retention.  He is currently optimized on Flomax and finasteride.  He is considered outlet procedures in the past.   PMH:     Past Medical History:  Diagnosis Date   Acute urinary retention 07/13/2020   Arthritis     Atrial fibrillation ([place])     Hypertension        Surgical History:      Past Surgical History:  Procedure Laterality Date   CARDIAC ELECTROPHYSIOLOGY STUDY AND ABLATION   07/20/2020   COLONOSCOPY   01/2011    repeat 3 yrs - @ [facility] [place] CATH AND CORONARY ANGIOGRAPHY N/A 07/15/2020    Procedure: LEFT HEART CATH AND CORONARY ANGIOGRAPHY;   Surgeon: Corey Skains, MD;  Location: [place] [facility];  Service: Cardiovascular;  Laterality: N/A;      Home Medications:  Allergies as of 05/18/2022         Reactions    Cyclobenzaprine Other (See Comments)    Acute urinary retention            Medication List           Accurate as of May 18, 2022 11:59 PM. If you have any questions, ask your nurse or doctor.              atorvastatin 40 MG tablet Commonly known as: LIPITOR Take by mouth.    ciprofloxacin 500 MG tablet Commonly known as: CIPRO SMARTSIG:1 Tablet(s) By Mouth Every 12 Hours    finasteride 5 MG tablet Commonly known as: PROSCAR Take 1 tablet (5 mg total) by mouth daily.    metoprolol succinate 25 MG 24 hr tablet Commonly known as: TOPROL-XL Take 25 mg by mouth daily.    NON FORMULARY CPAP Nightly.    tamsulosin 0.4 MG Caps capsule Commonly known as: FLOMAX Take 1 capsule (0.4 mg total) by mouth daily.    valsartan-hydrochlorothiazide 320-25 MG tablet Commonly known as: DIOVAN-HCT Take 1 tablet by mouth daily.    Xarelto 20 MG Tabs tablet Generic drug: rivaroxaban SMARTSIG:1 Tablet(s) By Mouth Every Evening  Allergies:       Allergies  Allergen Reactions   Cyclobenzaprine Other (See Comments)      Acute urinary retention      Family History:      Family History  Problem Relation Age of Onset   Colon cancer Mother          died age 49   Hypertension Father     Prostate cancer Father 33      Social History:  reports that he quit smoking about 34 years ago. His smoking use included cigarettes. He has a 2.50 pack-year smoking history. He has never used smokeless tobacco. He reports that he does not drink alcohol and does not use drugs.     Physical Exam: BP 131/83   Pulse (!) 54   Ht '6\' 1"'$  (1.854 m)   Wt 239 lb (108.4 kg)   BMI 31.53 kg/m   Constitutional:  Alert and oriented, No acute distress.  Foley catheter draining clear yellow urine.  Accompanied  by his wife today. HEENT: [place] AT, moist mucus membranes.  Trachea midline, no masses. Cardiovascular: No clubbing, cyanosis, or edema. Respiratory: Normal respiratory effort, no increased work of breathing. Skin: No rashes, bruises or suspicious lesions. Neurologic: Grossly intact, no focal deficits, moving all 4 extremities. Psychiatric: Normal mood and affect.   Laboratory Data:   Recent Labs       Lab Results  Component Value Date    CREATININE 1.12 03/06/2022      Recent Labs       Lab Results  Component Value Date    HGBA1C 6.4 (H) 03/06/2022        Assessment & Plan:     Urinary retention  - Catheter removed today , able to void but was instilled into the bladder -Strict warning symptoms return later today or as needed if his symptoms recur or he has difficulty voiding -Continue Flomax and finasteride for the time being - In light of history of retention times multiple occasions recommend he undergo a procedure such as HoLEP.  - We discussed alternatives including TURP vs. holmium laser enucleation of the prostate vs. greenlight laser ablation. Differences between the surgical procedures were discussed as well as the risks and benefits of each.  He is most interested in HoLEP.   We discussed the common postoperative course following holep including need for overnight Foley catheter, temporary worsening of irritative voiding symptoms, and occasional stress incontinence which typically lasts up to 6 months but can persist.  We discussed retrograde ejaculation and damage to surrounding structures including the urinary sphincter. Other uncommon complications including hematuria and urinary tract infection.   He understands all of the above and is willing to proceed as planned.      [name], MD

## 2022-07-04 ENCOUNTER — Other Ambulatory Visit: Payer: Self-pay | Admitting: Urology

## 2022-07-04 ENCOUNTER — Encounter: Payer: Self-pay | Admitting: Urology

## 2022-07-04 DIAGNOSIS — R339 Retention of urine, unspecified: Secondary | ICD-10-CM

## 2022-07-04 LAB — SURGICAL PATHOLOGY

## 2022-07-04 NOTE — Anesthesia Postprocedure Evaluation (Signed)
Anesthesia Post Note  Patient: John Hanna  Procedure(s) Performed: HOLEP-LASER ENUCLEATION OF THE PROSTATE WITH MORCELLATION     Patient location during evaluation: PACU Anesthesia Type: General Level of consciousness: awake and alert Pain management: pain level controlled Vital Signs Assessment: post-procedure vital signs reviewed and stable Respiratory status: spontaneous breathing, nonlabored ventilation, respiratory function stable and patient connected to nasal cannula oxygen Cardiovascular status: stable and blood pressure returned to baseline Postop Assessment: no apparent nausea or vomiting Anesthetic complications: no   No notable events documented.  Molli Barrows

## 2022-07-05 ENCOUNTER — Ambulatory Visit (INDEPENDENT_AMBULATORY_CARE_PROVIDER_SITE_OTHER): Payer: BC Managed Care – PPO | Admitting: Physician Assistant

## 2022-07-05 VITALS — BP 120/75 | HR 59 | Ht 73.0 in | Wt 238.0 lb

## 2022-07-05 DIAGNOSIS — N401 Enlarged prostate with lower urinary tract symptoms: Secondary | ICD-10-CM

## 2022-07-05 DIAGNOSIS — R338 Other retention of urine: Secondary | ICD-10-CM

## 2022-07-05 MED ORDER — FINASTERIDE 5 MG PO TABS: 5.0000 mg | ORAL_TABLET | Freq: Every day | ORAL | 0 refills | Status: DC

## 2022-07-05 MED ORDER — TAMSULOSIN HCL 0.4 MG PO CAPS: 0.4000 mg | ORAL_CAPSULE | Freq: Every day | ORAL | 11 refills | Status: DC

## 2022-07-05 NOTE — Progress Notes (Signed)
Catheter Removal  Patient is present today for a catheter removal.  46m of water was drained from the balloon. A 16FR  coude foley cath was removed from the bladder, no complications were noted. Patient tolerated well.  Performed by: TMardelle Matte,CMA

## 2022-07-05 NOTE — Progress Notes (Signed)
Catheter Removal  Patient is present today for a catheter removal.  33m of water was drained from the balloon. A 22FR foley cath was removed from the bladder, no complications were noted. Patient tolerated well.  Performed by: TMardelle Matte CMA  Additional notes: Counseled patient on normal postoperative findings including dysuria, gross hematuria, and urinary urgency/leakage. Counseled patient to begin Kegel exercises 3x10 sets daily to increase urinary control and wear absorbent products as needed for security. Written and verbal resources provided today. Surgical pathology negative, results shared with patient.   Follow up: 6 week postop f/u with Dr. BErlene Quan

## 2022-07-05 NOTE — Patient Instructions (Signed)

## 2022-08-15 ENCOUNTER — Ambulatory Visit (INDEPENDENT_AMBULATORY_CARE_PROVIDER_SITE_OTHER): Payer: BC Managed Care – PPO | Admitting: Urology

## 2022-08-15 VITALS — BP 127/84 | HR 66 | Ht 73.0 in | Wt 240.0 lb

## 2022-08-15 DIAGNOSIS — R310 Gross hematuria: Secondary | ICD-10-CM

## 2022-08-15 DIAGNOSIS — N401 Enlarged prostate with lower urinary tract symptoms: Secondary | ICD-10-CM | POA: Diagnosis not present

## 2022-08-15 DIAGNOSIS — R339 Retention of urine, unspecified: Secondary | ICD-10-CM

## 2022-08-15 DIAGNOSIS — R351 Nocturia: Secondary | ICD-10-CM

## 2022-08-15 LAB — BLADDER SCAN AMB NON-IMAGING: Scan Result: 0

## 2022-08-15 NOTE — Progress Notes (Signed)
08/15/2022 1:22 PM   John Hanna [birth year] [id number]  Referring provider: [name], MD [address] [place],  [place] [address]  Chief Complaint  Patient presents with   Follow-up    HPI: 61 year old male with a personal history of refractory urinary retention after multiple failed voiding trials secondary to BPH with outlet obstruction who returns today 6 weeks status post HoLEP on 07/03/2022.  Postoperative course was uncomplicated.  He underwent a successful voiding trial 2 days postop.  Surgical pathology consistent with nodular and stromal hypertrophy, negative for malignancy.  Resected volume approximately 60 g.  Preprocedure prostate volume 145 g.  Continues on Flomax and finasteride.  He reports he is doing extremely well today.  He has not had any post operative urinary leakage.  He does have some urgency but this seems to be improving.  Stream is excellent he is emptying well.  His only complaint is occasional dysuria but not always.  He is also having some intermittent gross hematuria which is light pink, especially in the morning or with activity but not at rest.  No clots or dark bloody urine.  He is on a blood thinner.  Results for orders placed or performed in visit on 08/15/22  Bladder Scan (Post Void Residual) in office  Result Value Ref Range   Scan Result 0      IPSS     Row Name 08/15/22 1000         International Prostate Symptom Score   How often have you had the sensation of not emptying your bladder? Not at All     How often have you had to urinate less than every two hours? Less than 1 in 5 times     How often have you found you stopped and started again several times when you urinated? Not at All     How often have you found it difficult to postpone urination? Not at All     How often have you had a weak urinary stream? Not at All     How often have you had to strain to start urination? Not at All     How many times  did you typically get up at night to urinate? 1 Time     Total IPSS Score 2       Quality of Life due to urinary symptoms   If you were to spend the rest of your life with your urinary condition just the way it is now how would you feel about that? Pleased              Score:  1-7 Mild 8-19 Moderate 20-35 Severe   PMH: Past Medical History:  Diagnosis Date   Acute urinary retention 07/13/2020   Arthritis    Atrial fibrillation (HCC)    a.) CHA2DS2VASc = 2 (age, HTN);  b.) 07/20/2020 s/p WACA PVI ([place]) with additional line of ablation just anterior to ligament of [place] given thickness of ridge;  c.) rate/rhythm maintained on oral metoprolol succinate; chronically anticoagulated with rivaroxaban   BPH (benign prostatic hyperplasia)    Colon polyps    Coronary artery disease 07/15/2020   a.) LHC 07/15/2020: 20% o-pLAD, 15% pLM, 20% mLM - med mgmt; b.) MPI 04/24/2019: EF 46%, no ischemic changes; c.) cMRI 10/15/2019: EF 55%, mild BAE, no LGE, bas-mid inferolateral perfusion defect c/w ischemia in OM of LCx or PLB of RCA.   Diastolic dysfunction 51/76/1607   a.) TTE 04/24/2019:  EF 45%, glob HK, mild LAE, triv PR, mild MR/TR, G1DD   DOE (dyspnea on exertion)    HLD (hyperlipidemia)    Hx of skin cancer, basal cell    Hypertension    LBBB (left bundle branch block)    Long term current use of anticoagulant    a.) rivaroxaban   OSA on CPAP    Palpitations    Plantar fasciitis    PSVT (paroxysmal supraventricular tachycardia) (HCC)    SOB (shortness of breath)    Tachycardia-bradycardia syndrome ([place])     Surgical History: Past Surgical History:  Procedure Laterality Date   CARDIAC ELECTROPHYSIOLOGY STUDY AND ABLATION  07/20/2020   COLONOSCOPY  01/2011   repeat 3 yrs - @ [place] internal [place] WITH MORCELLATION N/A 07/03/2022   Procedure: HOLEP-LASER ENUCLEATION OF THE PROSTATE WITH MORCELLATION;   Surgeon: [name], MD;  Location: [facility];  Service: Urology;  Laterality: N/A;   LEFT HEART CATH AND CORONARY ANGIOGRAPHY N/A 07/15/2020   Procedure: LEFT HEART CATH AND CORONARY ANGIOGRAPHY;  Surgeon: Corey Skains, MD;  Location: [facility];  Service: Cardiovascular;  Laterality: N/A;    Home Medications:  Allergies as of 08/15/2022       Reactions   Cyclobenzaprine Other (See Comments)   Acute urinary retention        Medication List        Accurate as of August 15, 2022  1:22 PM. If you have any questions, ask your nurse or doctor.          STOP taking these medications    tamsulosin 0.4 MG Caps capsule Commonly known as: FLOMAX       TAKE these medications    finasteride 5 MG tablet Commonly known as: PROSCAR Take 1 tablet (5 mg total) by mouth daily.   HYDROcodone-acetaminophen 5-325 MG tablet Commonly known as: NORCO/VICODIN Take 1-2 tablets by mouth every 6 (six) hours as needed for moderate pain.   metoprolol succinate 25 MG 24 hr tablet Commonly known as: TOPROL-XL Take 25 mg by mouth daily.   nitrofurantoin (macrocrystal-monohydrate) 100 MG capsule Commonly known as: Macrobid Take 1 capsule (100 mg total) by mouth 2 (two) times daily.   NON FORMULARY CPAP Nightly.   OVER THE COUNTER MEDICATION Potassium daily   oxybutynin 5 MG tablet Commonly known as: DITROPAN Take 1 tablet (5 mg total) by mouth every 8 (eight) hours as needed for bladder spasms.   valsartan-hydrochlorothiazide 320-25 MG tablet Commonly known as: DIOVAN-HCT Take 1 tablet by mouth daily.   Xarelto 20 MG Tabs tablet Generic drug: rivaroxaban SMARTSIG:1 Tablet(s) By Mouth Every Evening        Allergies:  Allergies  Allergen Reactions   Cyclobenzaprine Other (See Comments)    Acute urinary retention    Family History: Family History  Problem Relation Age of Onset   Colon cancer Mother        died age 7   Hypertension Father     Prostate cancer Father 73    Social History:  reports that he quit smoking about 34 years ago. His smoking use included cigarettes. He has a 2.50 pack-year smoking history. He has never used smokeless tobacco. He reports that he does not drink alcohol and does not use drugs.   Physical Exam: BP 127/84   Pulse 66   Ht '6\' 1"'$  (1.854 m)   Wt 240 lb (108.9  kg)   BMI 31.66 kg/m   Constitutional:  Alert and oriented, No acute distress. HEENT: [place] AT, moist mucus membranes.  Trachea midline, no masses. Cardiovascular: No clubbing, cyanosis, or edema. Respiratory: Normal respiratory effort, no increased work of breathing. Skin: No rashes, bruises or suspicious lesions. Neurologic: Grossly intact, no focal deficits, moving all 4 extremities. Psychiatric: Normal mood and affect.  Laboratory Data: Lab Results  Component Value Date   WBC 10.2 05/21/2022   HGB 13.8 05/21/2022   HCT 42.1 05/21/2022   MCV 89.6 05/21/2022   PLT 271 05/21/2022    Lab Results  Component Value Date   CREATININE 1.20 05/21/2022   Lab Results  Component Value Date   HGBA1C 6.4 (H) 03/06/2022      Assessment & Plan:    1. Urinary retention Resolved, PVR is now 0 status post holep - Bladder Scan (Post Void Residual) in office  2. Gross hematuria Intermittent mild gross hematuria, within the range of normal inspection in the setting of anticoagulation, does not appear to be severe  Advised to let us know becomes dark red or with clots  We will continue finasteride for the time being in light of this issue, anticipate this to be self-limited  May continue Eliquis for the time being  3. BPH associated with nocturia Okay to stop Flomax, excellent clinical outcome as outlined above  New baseline PSA at next visit  Pathology reviewed    Return in about 6 months (around 02/13/2023) for PSA (day of is fine), IPSS/ PVR.  [name], MD  [facility] [address],  [place], [place] [address] [phone]

## 2022-09-06 ENCOUNTER — Ambulatory Visit: Payer: BC Managed Care – PPO | Admitting: Internal Medicine

## 2022-09-06 ENCOUNTER — Encounter: Payer: Self-pay | Admitting: Internal Medicine

## 2022-09-06 VITALS — BP 120/76 | HR 58 | Ht 73.0 in | Wt 239.0 lb

## 2022-09-06 DIAGNOSIS — Z23 Encounter for immunization: Secondary | ICD-10-CM

## 2022-09-06 DIAGNOSIS — R7303 Prediabetes: Secondary | ICD-10-CM | POA: Diagnosis not present

## 2022-09-06 DIAGNOSIS — Z1211 Encounter for screening for malignant neoplasm of colon: Secondary | ICD-10-CM

## 2022-09-06 DIAGNOSIS — I10 Essential (primary) hypertension: Secondary | ICD-10-CM

## 2022-09-06 DIAGNOSIS — E782 Mixed hyperlipidemia: Secondary | ICD-10-CM

## 2022-09-06 LAB — POCT GLYCOSYLATED HEMOGLOBIN (HGB A1C): Hemoglobin A1C: 6.2 % — AB (ref 4.0–5.6)

## 2022-09-06 NOTE — Progress Notes (Signed)
Date:  09/06/2022   Name:  John Hanna   DOB:  1961/05/03   MRN:  [id number]   Chief Complaint: Hypertension and Prediabetes  Hypertension This is a chronic problem. The problem is controlled. Pertinent negatives include no chest pain, headaches, palpitations or shortness of breath. Past treatments include angiotensin blockers, beta blockers and diuretics. The current treatment provides significant improvement. Hypertensive end-organ damage includes CAD/MI. There is no history of kidney disease or CVA.  Diabetes He presents for his follow-up diabetic visit. Diabetes type: prediabetes. His disease course has been stable. Pertinent negatives for hypoglycemia include no dizziness or headaches. Pertinent negatives for diabetes include no chest pain, no fatigue and no weakness. Pertinent negatives for diabetic complications include no CVA. Current diabetic treatment includes diet.    Lab Results  Component Value Date   NA 140 05/21/2022   K 3.3 (L) 05/21/2022   CO2 24 05/21/2022   GLUCOSE 103 (H) 05/21/2022   BUN 14 05/21/2022   CREATININE 1.20 05/21/2022   CALCIUM 9.4 05/21/2022   EGFR 76 03/06/2022   GFRNONAA >60 05/21/2022   Lab Results  Component Value Date   CHOL 108 03/06/2022   HDL 41 03/06/2022   LDLCALC 48 03/06/2022   TRIG 97 03/06/2022   CHOLHDL 2.6 03/06/2022   Lab Results  Component Value Date   TSH 1.655 07/13/2020   Lab Results  Component Value Date   HGBA1C 6.2 (A) 09/06/2022   Lab Results  Component Value Date   WBC 10.2 05/21/2022   HGB 13.8 05/21/2022   HCT 42.1 05/21/2022   MCV 89.6 05/21/2022   PLT 271 05/21/2022   Lab Results  Component Value Date   ALT 26 03/06/2022   AST 16 03/06/2022   ALKPHOS 51 03/06/2022   BILITOT 0.5 03/06/2022   No results found for: "25OHVITD2", "25OHVITD3", "VD25OH"   Review of Systems  Constitutional:  Negative for fatigue and unexpected weight change.  HENT:  Negative for nosebleeds.   Eyes:  Negative  for visual disturbance.  Respiratory:  Negative for cough, chest tightness, shortness of breath and wheezing.   Cardiovascular:  Negative for chest pain, palpitations and leg swelling.  Gastrointestinal:  Negative for abdominal pain, constipation and diarrhea.  Neurological:  Negative for dizziness, weakness, light-headedness and headaches.    Patient Active Problem List   Diagnosis Date Noted   Mixed hyperlipidemia 03/06/2022   Prediabetes 03/07/2021   Acquired thrombophilia ([place]) 03/03/2021   S/P ablation of atrial fibrillation 12/01/2020   Coronary artery disease involving native coronary artery of native heart without angina pectoris 05/04/2020   Paroxysmal atrial fibrillation ([place]) 02/26/2020   OSA on CPAP 06/20/2019   BPH associated with nocturia 02/21/2018   Essential (primary) hypertension 07/26/2015   History of colon polyps 07/26/2015   H/O malignant neoplasm of skin 07/26/2015   Plantar fasciitis 07/26/2015    Allergies  Allergen Reactions   Cyclobenzaprine Other (See Comments)    Acute urinary retention    Past Surgical History:  Procedure Laterality Date   CARDIAC ELECTROPHYSIOLOGY STUDY AND ABLATION  07/20/2020   COLONOSCOPY  01/2011   repeat 3 yrs - @ [place] internal [place] WITH MORCELLATION N/A 07/03/2022   Procedure: HOLEP-LASER ENUCLEATION OF THE PROSTATE WITH MORCELLATION;  Surgeon: [name], MD;  Location: [facility];  Service: Urology;  Laterality: N/A;   LEFT HEART CATH AND CORONARY ANGIOGRAPHY N/A 07/15/2020   Procedure:  LEFT HEART CATH AND CORONARY ANGIOGRAPHY;  Surgeon: Corey Skains, MD;  Location: [place] [facility];  Service: Cardiovascular;  Laterality: N/A;    Social History   Tobacco Use   Smoking status: Former    Packs/day: 0.50    Years: 5.00    Total pack years: 2.50    Types: Cigarettes    Quit date: 10/17/1987    Years since quitting: 34.9   Smokeless  tobacco: Never  Vaping Use   Vaping Use: Never used  Substance Use Topics   Alcohol use: No    Alcohol/week: 0.0 standard drinks of alcohol   Drug use: Never     Medication list has been reviewed and updated.  Current Meds  Medication Sig   finasteride (PROSCAR) 5 MG tablet Take 1 tablet (5 mg total) by mouth daily.   HYDROcodone-acetaminophen (NORCO/VICODIN) 5-325 MG tablet Take 1-2 tablets by mouth every 6 (six) hours as needed for moderate pain.   metoprolol succinate (TOPROL-XL) 25 MG 24 hr tablet Take 25 mg by mouth daily.   NON FORMULARY CPAP Nightly.   OVER THE COUNTER MEDICATION Potassium daily   valsartan-hydrochlorothiazide (DIOVAN-HCT) 320-25 MG tablet Take 1 tablet by mouth daily.   XARELTO 20 MG TABS tablet SMARTSIG:1 Tablet(s) By Mouth Every Evening       09/06/2022   10:41 AM 03/06/2022    9:13 AM 08/24/2021    8:21 AM 03/03/2021   10:32 AM  GAD 7 : Generalized Anxiety Score  Nervous, Anxious, on Edge 0 0 0 0  Control/stop worrying 0 0 0 0  Worry too much - different things 0 0 0 0  Trouble relaxing 0 0 0 0  Restless 0 0 0 0  Easily annoyed or irritable 0 0 0 0  Afraid - awful might happen 0 0 0 0  Total GAD 7 Score 0 0 0 0  Anxiety Difficulty Not difficult at all Not difficult at all Not difficult at all        09/06/2022   10:41 AM 03/06/2022    9:13 AM 08/24/2021    8:21 AM  Depression screen PHQ 2/9  Decreased Interest 0 3 0  Down, Depressed, Hopeless 0 0 0  PHQ - 2 Score 0 3 0  Altered sleeping 0 0 0  Tired, decreased energy 1 1 0  Change in appetite 0 0 0  Feeling bad or failure about yourself  0 0 0  Trouble concentrating 0 0 0  Moving slowly or fidgety/restless 0 0 0  Suicidal thoughts 0 0 0  PHQ-9 Score 1 4 0  Difficult doing work/chores Not difficult at all Not difficult at all Not difficult at all    BP Readings from Last 3 Encounters:  09/06/22 120/76  08/15/22 127/84  07/05/22 120/75    Physical Exam Vitals and nursing note  reviewed.  Constitutional:      General: He is not in acute distress.    Appearance: Normal appearance. He is well-developed.  HENT:     Head: Normocephalic and atraumatic.  Cardiovascular:     Rate and Rhythm: Normal rate and regular rhythm.     Pulses: Normal pulses.     Heart sounds: No murmur heard. Pulmonary:     Effort: Pulmonary effort is normal. No respiratory distress.     Breath sounds: No wheezing or rhonchi.  Musculoskeletal:     Cervical back: Normal range of motion.     Right lower leg: No edema.  Left lower leg: No edema.  Lymphadenopathy:     Cervical: No cervical adenopathy.  Skin:    General: Skin is warm and dry.     Findings: No rash.  Neurological:     Mental Status: He is alert and oriented to person, place, and time.  Psychiatric:        Mood and Affect: Mood normal.        Behavior: Behavior normal.     Wt Readings from Last 3 Encounters:  09/06/22 239 lb (108.4 kg)  08/15/22 240 lb (108.9 kg)  07/05/22 238 lb (108 kg)    BP 120/76   Pulse (!) 58   Ht _0  (1.854 m)   Wt 239 lb (108.4 kg)   SpO2 96%   BMI 31.53 kg/m   Assessment and Plan: 1. Essential (primary) hypertension Clinically stable exam with well controlled BP. Tolerating medications without side effects at this time. Pt to continue current regimen and low sodium diet; benefits of regular exercise as able discussed. No angina.  Shortness of breath with exertion is stable.  2. Prediabetes Discussed dietary changes and exercise Weight loss is advised - POCT HgB A1C= 6.2 down from 6.4  3. Mixed hyperlipidemia Tolerating statin medication without side effects at this time LDL is at goal of < 70 on current dose Continue same therapy without change at this time.  4. Colon cancer screening Due for colonoscopy but waiting to heal completely from his prostate surgery   Partially dictated using Dragon software. Any errors are unintentional.  Halina Maidens, MD [facility]  09/06/2022

## 2022-11-18 ENCOUNTER — Other Ambulatory Visit: Payer: Self-pay | Admitting: Physician Assistant

## 2022-11-18 DIAGNOSIS — N401 Enlarged prostate with lower urinary tract symptoms: Secondary | ICD-10-CM

## 2022-11-24 ENCOUNTER — Ambulatory Visit
Admission: RE | Admit: 2022-11-24 | Discharge: 2022-11-24 | Disposition: A | Discharge status: Home / Self Care | Payer: BC Managed Care – PPO | Source: Ambulatory Visit | Attending: Emergency Medicine | Admitting: Emergency Medicine

## 2022-11-24 VITALS — BP 123/85 | HR 86 | Temp 98.6°F | Resp 18

## 2022-11-24 DIAGNOSIS — L0211 Cutaneous abscess of neck: Secondary | ICD-10-CM | POA: Diagnosis not present

## 2022-11-24 MED ORDER — DOXYCYCLINE HYCLATE 100 MG PO CAPS: 100.0000 mg | ORAL_CAPSULE | Freq: Two times a day (BID) | ORAL | 0 refills | Status: DC

## 2022-11-24 NOTE — ED Triage Notes (Signed)
Patient to Urgent Care with complaints of abscess present to the back of his neck/ along his hair line. Reports area appeared three days ago. Has noticed some white/ clear drainage.  Hx of the same several years ago. Reports he wears a cpap and the straps of the cpap have been irritating the back of his neck.

## 2022-11-24 NOTE — ED Provider Notes (Signed)
John Hanna    CSN: [id number] Arrival date & time: 11/24/22  1320      History   Chief Complaint Chief Complaint  Patient presents with   Abscess    Had a cyst on neck maybe 5 years and had to get It lanced. One has popped back up in another spot on back of neck and causing discomfort! - Entered by patient    HPI John Hanna is a 62 y.o. male.  Patient present with 3 day history of tender firm abscess on the back of his neck.  It has scant clear drainage at times.  No fever, chills, numbness, weakness, or other symptoms.  No treatment at home.  He reports previous skin abscess in this same general area.  His medical history includes hypertension, atrial fibrillation, CAD, diastolic dysfunction, left bundle branch block, long term use of anticoagulant, obstructive sleep apnea, BPH, prediabetes.   The history is provided by the patient and medical records.    Past Medical History:  Diagnosis Date   Acute urinary retention 07/13/2020   Arthritis    Atrial fibrillation ([place])    a.) CHA2DS2VASc = 2 (age, HTN);  b.) 07/20/2020 s/p WACA PVI ([place]) with additional line of ablation just anterior to ligament of [place] given thickness of ridge;  c.) rate/rhythm maintained on oral metoprolol succinate; chronically anticoagulated with rivaroxaban   BPH (benign prostatic hyperplasia)    Colon polyps    Coronary artery disease 07/15/2020   a.) LHC 07/15/2020: 20% o-pLAD, 15% pLM, 20% mLM - med mgmt; b.) MPI 04/24/2019: EF 46%, no ischemic changes; c.) cMRI 10/15/2019: EF 55%, mild BAE, no LGE, bas-mid inferolateral perfusion defect c/w ischemia in OM of LCx or PLB of RCA.   Diastolic dysfunction [id number]   a.) TTE 04/24/2019: EF 45%, glob HK, mild LAE, triv PR, mild MR/TR, G1DD   DOE (dyspnea on exertion)    HLD (hyperlipidemia)    Hx of skin cancer, basal cell    Hypertension    LBBB (left bundle branch block)    Long term current use of anticoagulant    a.) rivaroxaban    OSA on CPAP    Palpitations    Plantar fasciitis    PSVT (paroxysmal supraventricular tachycardia)    SOB (shortness of breath)    Tachycardia-bradycardia syndrome ([place])     Patient Active Problem List   Diagnosis Date Noted   Mixed hyperlipidemia 03/06/2022   Prediabetes 03/07/2021   Acquired thrombophilia ([place]) 03/03/2021   S/P ablation of atrial fibrillation 12/01/2020   Coronary artery disease involving native coronary artery of native heart without angina pectoris 05/04/2020   Paroxysmal atrial fibrillation ([place]) 02/26/2020   OSA on CPAP 06/20/2019   BPH associated with nocturia 02/21/2018   Essential (primary) hypertension 07/26/2015   History of colon polyps 07/26/2015   H/O malignant neoplasm of skin 07/26/2015   Plantar fasciitis 07/26/2015    Past Surgical History:  Procedure Laterality Date   CARDIAC ELECTROPHYSIOLOGY STUDY AND ABLATION  07/20/2020   COLONOSCOPY  01/2011   repeat 3 yrs - @ [facility] internal [place] WITH MORCELLATION N/A 07/03/2022   Procedure: HOLEP-LASER ENUCLEATION OF THE PROSTATE WITH MORCELLATION;  Surgeon: [name], MD;  Location: [facility] OR[facility];  Service: Urology;  Laterality: N/A;   LEFT HEART CATH AND CORONARY ANGIOGRAPHY N/A 07/15/2020   Procedure: LEFT HEART CATH AND CORONARY ANGIOGRAPHY;  Surgeon: Corey Skains,  MD;  Location: [facility] [facility];  Service: Cardiovascular;  Laterality: N/A;       Home Medications    Prior to Admission medications   Medication Sig Start Date End Date Taking? Authorizing Provider  doxycycline (VIBRAMYCIN) 100 MG capsule Take 1 capsule (100 mg total) by mouth 2 (two) times daily for 7 days. 11/24/22 12/01/22 Yes Sharion Balloon, NP  finasteride (PROSCAR) 5 MG tablet Take 1 tablet by mouth once daily 11/20/22   Debroah Loop, PA-C  HYDROcodone-acetaminophen (NORCO/VICODIN) 5-325 MG tablet Take 1-2 tablets by mouth every 6 (six)  hours as needed for moderate pain. Patient not taking: Reported on 11/24/2022 07/03/22   [name], MD  metoprolol succinate (TOPROL-XL) 25 MG 24 hr tablet Take 25 mg by mouth daily. 01/22/22   [provider]  NON FORMULARY CPAP Nightly.    [provider]  OVER THE COUNTER MEDICATION Potassium daily    [provider]  valsartan-hydrochlorothiazide (DIOVAN-HCT) 320-25 MG tablet Take 1 tablet by mouth daily. 03/06/22   [name], MD  XARELTO 20 MG TABS tablet SMARTSIG:1 Tablet(s) By Mouth Every Evening 02/21/21   [provider]    Family History Family History  Problem Relation Age of Onset   Colon cancer Mother        died age 75   Hypertension Father    Prostate cancer Father 43    Social History Social History   Tobacco Use   Smoking status: Former    Packs/day: 0.50    Years: 5.00    Total pack years: 2.50    Types: Cigarettes    Quit date: 10/17/1987    Years since quitting: 35.1   Smokeless tobacco: Never  Vaping Use   Vaping Use: Never used  Substance Use Topics   Alcohol use: No    Alcohol/week: 0.0 standard drinks of alcohol   Drug use: Never     Allergies   Cyclobenzaprine   Review of Systems Review of Systems  Constitutional:  Negative for chills and fever.  Skin:  Positive for wound. Negative for color change.  Neurological:  Negative for weakness and numbness.  All other systems reviewed and are negative.    Physical Exam Triage Vital Signs ED Triage Vitals  Enc Vitals Group     BP      Pulse      Resp      Temp      Temp src      SpO2      Weight      Height      Head Circumference      Peak Flow      Pain Score      Pain Loc      Pain Edu?      Excl. in [place]?    No data found.  Updated Vital Signs BP 123/85   Pulse 86   Temp 98.6 F (37 C)   Resp 18   SpO2 95%   Visual Acuity Right Eye Distance:   Left Eye Distance:   Bilateral Distance:    Right Eye Near:   Left Eye Near:     Bilateral Near:     Physical Exam Vitals and nursing note reviewed.  Constitutional:      General: He is not in acute distress.    Appearance: He is well-developed. He is not ill-appearing.  HENT:     Mouth/Throat:     Mouth: Mucous membranes are moist.  Neck:   Cardiovascular:     Rate and Rhythm: Normal rate and regular rhythm.  Pulmonary:     Effort: Pulmonary effort is normal. No respiratory distress.  Musculoskeletal:        General: No swelling. Normal range of motion.     Cervical back: Neck supple.  Skin:    General: Skin is warm and dry.     Findings: Lesion present.  Neurological:     Mental Status: He is alert.     Sensory: No sensory deficit.     Motor: No weakness.  Psychiatric:        Mood and Affect: Mood normal.        Behavior: Behavior normal.      UC Treatments / Results  Labs (all labs ordered are listed, but only abnormal results are displayed) Labs Reviewed - No data to display  EKG   Radiology No results found.  Procedures Procedures (including critical care time)  Medications Ordered in UC Medications - No data to display  Initial Impression / Assessment and Plan / UC Course  I have reviewed the triage vital signs and the nursing notes.  Pertinent labs & imaging results that were available during my care of the patient were reviewed by me and considered in my medical decision making (see chart for details).    Abscess of neck.  No I&D indicated today.  Area is firm and nonfluctuant.  Treating with doxycycline.  Instructed patient to follow up with his PCP next week.  Education provided on skin abscess.  He agrees to plan of care.    Final Clinical Impressions(s) / UC Diagnoses   Final diagnoses:  Abscess of neck     Discharge Instructions      Take the doxycycline as directed.    Keep the area clean and dry.  Wash it gently twice a day with soap and water.  Apply an antibiotic cream twice a day.    Follow up with your  primary care provider next week.        ED Prescriptions     Medication Sig Dispense Auth. Provider   doxycycline (VIBRAMYCIN) 100 MG capsule Take 1 capsule (100 mg total) by mouth 2 (two) times daily for 7 days. 14 capsule Sharion Balloon, NP      PDMP not reviewed this encounter.   Sharion Balloon, NP 11/24/22 1400

## 2022-11-24 NOTE — Discharge Instructions (Addendum)
Take the doxycycline as directed.    Keep the area clean and dry.  Wash it gently twice a day with soap and water.  Apply an antibiotic cream twice a day.    Follow up with your primary care provider next week.

## 2022-11-27 ENCOUNTER — Telehealth: Payer: Self-pay

## 2022-11-27 NOTE — Transitions of Care (Post Inpatient/ED Visit) (Signed)
   11/27/2022  Name: John Hanna MRN: [id number] DOB: 1961-05-10  Today's TOC FU Call Status:    Transition Care Management Follow-up Telephone Call Date of Discharge: 11/25/22 Discharge Facility: Other Facility Name of Other Discharge Facility: [facility] Type of Discharge: Emergency Department Reason for ED Visit: Other: (Cellulitis) How have you been since you were released from the hospital?: Same Any questions or concerns?: Yes Patient Questions/Concerns:: New medication is causing hands to itch. Patient Questions/Concerns Addressed: Notified Provider of Patient Questions/Concerns  Items Reviewed: Did you receive and understand the discharge instructions provided?: Yes Medications obtained and verified?: Yes (Medications Reviewed) Any new allergies since your discharge?: Yes Dietary orders reviewed?: No Do you have support at home?: Yes People in Home: spouse Name of Support/Comfort Primary Source: [facility] and Equipment/Supplies: [place] Ordered?: No  Functional Questionnaire: Do you need assistance with bathing/showering or dressing?: No Do you need assistance with meal preparation?: No Do you need assistance with eating?: No Do you have difficulty maintaining continence: No Do you need assistance with getting out of bed/getting out of a chair/moving?: No Do you have difficulty managing or taking your medications?: No  Folllow up appointments reviewed: PCP Follow-up appointment confirmed?: Yes MD Provider Line [phone] Given: Yes Date of PCP follow-up appointment?: 12/01/22 Follow-up Provider: Halina Maidens, MD [facility] Follow-up appointment confirmed?: No Do you need transportation to your follow-up appointment?: No Do you understand care options if your condition(s) worsen?: Yes-patient verbalized understanding    [facility] [place], [address] [place]  [place] [address] Office [phone]  Fax: [phone]

## 2022-12-01 ENCOUNTER — Encounter: Payer: Self-pay | Admitting: Internal Medicine

## 2022-12-01 ENCOUNTER — Ambulatory Visit: Payer: BC Managed Care – PPO | Admitting: Internal Medicine

## 2022-12-01 VITALS — BP 127/82 | HR 72 | Ht 73.0 in | Wt 244.0 lb

## 2022-12-01 DIAGNOSIS — L723 Sebaceous cyst: Secondary | ICD-10-CM

## 2022-12-01 DIAGNOSIS — L089 Local infection of the skin and subcutaneous tissue, unspecified: Secondary | ICD-10-CM

## 2022-12-01 DIAGNOSIS — I10 Essential (primary) hypertension: Secondary | ICD-10-CM | POA: Diagnosis not present

## 2022-12-01 MED ORDER — DOXYCYCLINE HYCLATE 100 MG PO TABS: 100.0000 mg | ORAL_TABLET | Freq: Two times a day (BID) | ORAL | 0 refills | Status: AC

## 2022-12-01 NOTE — Assessment & Plan Note (Signed)
Clinically stable exam with well controlled BP on valsartan hct and metoprolol. Tolerating medications without side effects. Pt to continue current regimen and low sodium diet.

## 2022-12-01 NOTE — Progress Notes (Signed)
Date:  12/01/2022   Name:  John Hanna   DOB:  1961/03/06   MRN:  [id number]   Chief Complaint: ER Follow Up Follow up from ER 11/25/22.  Seen in UC 11/24/22 and given Doxycycline.  He went to ER the next day. TOC call done on 11/27/22.  ED Assessment/Plan   Patient has cellulitis of his posterior neck. It may be coalescing into an abscess but at this time there does not appear to be a drainable abscess on ultrasound or by exam. He is currently on doxycycline but has only taken 2 doses. Recommend he continue this and will add cephalexin. He is afebrile here. Perhaps some subjective fevers at home but overall not consistent with a septic picture. Recommend warm compresses and close follow-up with PCP. Return precautions reviewed.    Hypertension This is a chronic problem. The problem is controlled. Pertinent negatives include no chest pain or shortness of breath. Past treatments include beta blockers, angiotensin blockers and diuretics. The current treatment provides significant improvement.    Lab Results  Component Value Date   NA 140 05/21/2022   K 3.3 (L) 05/21/2022   CO2 24 05/21/2022   GLUCOSE 103 (H) 05/21/2022   BUN 14 05/21/2022   CREATININE 1.20 05/21/2022   CALCIUM 9.4 05/21/2022   EGFR 76 03/06/2022   GFRNONAA >60 05/21/2022   Lab Results  Component Value Date   CHOL 108 03/06/2022   HDL 41 03/06/2022   LDLCALC 48 03/06/2022   TRIG 97 03/06/2022   CHOLHDL 2.6 03/06/2022   Lab Results  Component Value Date   TSH 1.655 07/13/2020   Lab Results  Component Value Date   HGBA1C 6.2 (A) 09/06/2022   Lab Results  Component Value Date   WBC 10.2 05/21/2022   HGB 13.8 05/21/2022   HCT 42.1 05/21/2022   MCV 89.6 05/21/2022   PLT 271 05/21/2022   Lab Results  Component Value Date   ALT 26 03/06/2022   AST 16 03/06/2022   ALKPHOS 51 03/06/2022   BILITOT 0.5 03/06/2022   No results found for: "25OHVITD2", "25OHVITD3", "VD25OH"   Review of Systems   Constitutional:  Negative for chills, fatigue and fever.  Respiratory:  Negative for cough, chest tightness and shortness of breath.   Cardiovascular:  Negative for chest pain.  Skin:  Positive for color change and wound.  Psychiatric/Behavioral:  Negative for dysphoric mood and sleep disturbance. The patient is not nervous/anxious.     Patient Active Problem List   Diagnosis Date Noted   Mixed hyperlipidemia 03/06/2022   Prediabetes 03/07/2021   Acquired thrombophilia ([place]) 03/03/2021   S/P ablation of atrial fibrillation 12/01/2020   Coronary artery disease involving native coronary artery of native heart without angina pectoris 05/04/2020   Paroxysmal atrial fibrillation ([place]) 02/26/2020   OSA on CPAP 06/20/2019   BPH associated with nocturia 02/21/2018   Essential (primary) hypertension 07/26/2015   History of colon polyps 07/26/2015   H/O malignant neoplasm of skin 07/26/2015   Plantar fasciitis 07/26/2015    Allergies  Allergen Reactions   Cyclobenzaprine Other (See Comments)    Acute urinary retention    Past Surgical History:  Procedure Laterality Date   CARDIAC ELECTROPHYSIOLOGY STUDY AND ABLATION  07/20/2020   COLONOSCOPY  01/2011   repeat 3 yrs - @ [facility] WITH MORCELLATION N/A 07/03/2022   Procedure: HOLEP-LASER ENUCLEATION OF THE PROSTATE WITH  MORCELLATION;  Surgeon: [name], MD;  Location: [facility] ORS;  Service: Urology;  Laterality: N/A;   LEFT HEART CATH AND CORONARY ANGIOGRAPHY N/A 07/15/2020   Procedure: LEFT HEART CATH AND CORONARY ANGIOGRAPHY;  Surgeon: Corey Skains, MD;  Location: [facility];  Service: Cardiovascular;  Laterality: N/A;    Social History   Tobacco Use   Smoking status: Former    Packs/day: 0.50    Years: 5.00    Total pack years: 2.50    Types: Cigarettes    Quit date: 10/17/1987    Years since quitting: 35.1   Smokeless  tobacco: Never  Vaping Use   Vaping Use: Never used  Substance Use Topics   Alcohol use: No    Alcohol/week: 0.0 standard drinks of alcohol   Drug use: Never     Medication list has been reviewed and updated.  Current Meds  Medication Sig   doxycycline (VIBRA-TABS) 100 MG tablet Take 1 tablet (100 mg total) by mouth 2 (two) times daily for 10 days.   finasteride (PROSCAR) 5 MG tablet Take 1 tablet by mouth once daily   HYDROcodone-acetaminophen (NORCO/VICODIN) 5-325 MG tablet Take 1-2 tablets by mouth every 6 (six) hours as needed for moderate pain.   metoprolol succinate (TOPROL-XL) 25 MG 24 hr tablet Take 25 mg by mouth daily.   NON FORMULARY CPAP Nightly.   OVER THE COUNTER MEDICATION Potassium daily   valsartan-hydrochlorothiazide (DIOVAN-HCT) 320-25 MG tablet Take 1 tablet by mouth daily.   XARELTO 20 MG TABS tablet SMARTSIG:1 Tablet(s) By Mouth Every Evening       12/01/2022    1:54 PM 09/06/2022   10:41 AM 03/06/2022    9:13 AM 08/24/2021    8:21 AM  GAD 7 : Generalized Anxiety Score  Nervous, Anxious, on Edge 0 0 0 0  Control/stop worrying 0 0 0 0  Worry too much - different things 0 0 0 0  Trouble relaxing 0 0 0 0  Restless 0 0 0 0  Easily annoyed or irritable 0 0 0 0  Afraid - awful might happen 0 0 0 0  Total GAD 7 Score 0 0 0 0  Anxiety Difficulty Not difficult at all Not difficult at all Not difficult at all Not difficult at all       12/01/2022    1:54 PM 09/06/2022   10:41 AM 03/06/2022    9:13 AM  Depression screen PHQ 2/9  Decreased Interest 0 0 3  Down, Depressed, Hopeless 0 0 0  PHQ - 2 Score 0 0 3  Altered sleeping 0 0 0  Tired, decreased energy 1 1 1  $ Change in appetite 0 0 0  Feeling bad or failure about yourself  0 0 0  Trouble concentrating 0 0 0  Moving slowly or fidgety/restless 0 0 0  Suicidal thoughts 0 0 0  PHQ-9 Score 1 1 4  $ Difficult doing work/chores Not difficult at all Not difficult at all Not difficult at all    BP Readings  from Last 3 Encounters:  12/01/22 127/82  11/24/22 123/85  09/06/22 120/76    Physical Exam Vitals and nursing note reviewed.  Constitutional:      General: He is not in acute distress.    Appearance: He is well-developed.  HENT:     Head: Normocephalic and atraumatic.  Cardiovascular:     Rate and Rhythm: Normal rate and regular rhythm.  Pulmonary:     Effort: Pulmonary effort is normal.  No respiratory distress.     Breath sounds: No wheezing or rhonchi.  Musculoskeletal:     Cervical back: Normal range of motion.  Skin:    General: Skin is warm and dry.     Findings: No rash.          Comments: Red lesion with central eschar No fluctuance or drainage  Neurological:     Mental Status: He is alert and oriented to person, place, and time.  Psychiatric:        Mood and Affect: Mood normal.        Behavior: Behavior normal.     Wt Readings from Last 3 Encounters:  12/01/22 244 lb (110.7 kg)  09/06/22 239 lb (108.4 kg)  08/15/22 240 lb (108.9 kg)    BP 127/82 (BP Location: Right Arm, Cuff Size: Large)   Pulse 72   Ht 6' 1"$  (1.854 m)   Wt 244 lb (110.7 kg)   SpO2 98%   BMI 32.19 kg/m   Assessment and Plan: Problem List Items Addressed This Visit       Cardiovascular and Mediastinum   Essential (primary) hypertension (Chronic)    Clinically stable exam with well controlled BP on valsartan hct and metoprolol. Tolerating medications without side effects. Pt to continue current regimen and low sodium diet.       Other Visit Diagnoses     Infected sebaceous cyst of skin    -  Primary   healing without need for I&D will give another course of Doxycyline continue Tylenol as needed for discomfort   Relevant Medications   doxycycline (VIBRA-TABS) 100 MG tablet        Partially dictated using Editor, commissioning. Any errors are unintentional.  Halina Maidens, MD [facility]  12/01/2022

## 2023-02-06 ENCOUNTER — Ambulatory Visit: Payer: BC Managed Care – PPO | Admitting: Urology

## 2023-02-13 ENCOUNTER — Other Ambulatory Visit: Payer: BC Managed Care – PPO

## 2023-02-13 DIAGNOSIS — N401 Enlarged prostate with lower urinary tract symptoms: Secondary | ICD-10-CM

## 2023-02-14 LAB — PSA: Prostate Specific Ag, Serum: 0.3 ng/mL (ref 0.0–4.0)

## 2023-02-20 ENCOUNTER — Ambulatory Visit: Payer: BC Managed Care – PPO | Admitting: Urology

## 2023-02-20 VITALS — BP 132/82 | HR 62 | Ht 74.0 in | Wt 240.4 lb

## 2023-02-20 DIAGNOSIS — N401 Enlarged prostate with lower urinary tract symptoms: Secondary | ICD-10-CM

## 2023-02-20 DIAGNOSIS — N138 Other obstructive and reflux uropathy: Secondary | ICD-10-CM

## 2023-02-20 DIAGNOSIS — R351 Nocturia: Secondary | ICD-10-CM | POA: Diagnosis not present

## 2023-02-20 LAB — BLADDER SCAN AMB NON-IMAGING: Scan Result: 12

## 2023-02-20 NOTE — Progress Notes (Signed)
I, Amy L Pierron, acting as a scribe for Vanna Scotland, MD.,have documented all relevant documentation on the behalf of Vanna Scotland, MD,as directed by  Vanna Scotland, MD while in the presence of Vanna Scotland, MD.  02/20/2023 10:36 AM   John Hanna Sep 10, 1961 [id number]  Referring provider: Reubin Milan, MD [address] [place],  [place] [address]  Chief Complaint  Patient presents with   Benign Prostatic Hypertrophy    HPI: 62 year old male with a personal history of BPH returns today for a six-month follow-up.   He underwent Holmium laser enucleation of the prostate on 07/03/2022. His preoperative estimated prostate volume was 145. Surgical pathology was negative. Resected volume 60 cc's. He passed a voiding trial and is no longer on BPH medications. His most recent PSA on 02/13/2023 was 0.3.   He reports his urinary symptoms have resolved and is doing well. He has some itching at times but no rash. He has one more pill of Finasteride to take. He reports having less fluid with ejaculation.    Results for orders placed or performed in visit on 02/20/23  Bladder Scan (Post Void Residual) in office  Result Value Ref Range   Scan Result 12 ml     IPSS     Row Name 02/20/23 0900         International Prostate Symptom Score   How often have you had the sensation of not emptying your bladder? Not at All     How often have you had to urinate less than every two hours? Not at All     How often have you found you stopped and started again several times when you urinated? Not at All     How often have you found it difficult to postpone urination? Not at All     How often have you had a weak urinary stream? Not at All     How often have you had to strain to start urination? Not at All     How many times did you typically get up at night to urinate? 1 Time     Total IPSS Score 1       Quality of Life due to urinary symptoms   If you were to spend the rest of  your life with your urinary condition just the way it is now how would you feel about that? Delighted             Score:  1-7 Mild 8-19 Moderate 20-35 Severe   PMH: Past Medical History:  Diagnosis Date   Acute urinary retention 07/13/2020   Arthritis    Atrial fibrillation (HCC)    a.) CHA2DS2VASc = 2 (age, HTN);  b.) 07/20/2020 s/p [facility] PVI ([place]) with additional line of ablation just anterior to ligament of [place] given thickness of ridge;  c.) rate/rhythm maintained on oral metoprolol succinate; chronically anticoagulated with rivaroxaban   BPH (benign prostatic hyperplasia)    Colon polyps    Coronary artery disease 07/15/2020   a.) LHC 07/15/2020: 20% o-pLAD, 15% pLM, 20% mLM - med mgmt; b.) MPI 04/24/2019: EF 46%, no ischemic changes; c.) cMRI 10/15/2019: EF 55%, mild BAE, no LGE, bas-mid inferolateral perfusion defect c/w ischemia in OM of LCx or PLB of RCA.   Diastolic dysfunction 04/24/2019   a.) TTE 04/24/2019: EF 45%, glob HK, mild LAE, triv PR, mild MR/TR, G1DD   DOE (dyspnea on exertion)    HLD (hyperlipidemia)    Hx  of skin cancer, basal cell    Hypertension    LBBB (left bundle branch block)    Long term current use of anticoagulant    a.) rivaroxaban   OSA on CPAP    Palpitations    Plantar fasciitis    PSVT (paroxysmal supraventricular tachycardia)    SOB (shortness of breath)    Tachycardia-bradycardia syndrome (HCC)     Surgical History: Past Surgical History:  Procedure Laterality Date   CARDIAC ELECTROPHYSIOLOGY STUDY AND ABLATION  07/20/2020   COLONOSCOPY  01/2011   repeat 3 yrs - @ [facility]   HERNIA REPAIR  1992   HOLEP-LASER ENUCLEATION OF THE PROSTATE WITH MORCELLATION N/A 07/03/2022   Procedure: HOLEP-LASER ENUCLEATION OF THE PROSTATE WITH MORCELLATION;  Surgeon: Vanna Scotland, MD;  Location: [facility] OR[facility];  Service: Urology;  Laterality: N/A;   LEFT HEART CATH AND CORONARY ANGIOGRAPHY N/A 07/15/2020   Procedure: LEFT  HEART CATH AND CORONARY ANGIOGRAPHY;  Surgeon: Lamar Blinks, MD;  Location: [facility];  Service: Cardiovascular;  Laterality: N/A;    Home Medications:  Allergies as of 02/20/2023       Reactions   Cyclobenzaprine Other (See Comments)   Acute urinary retention        Medication List        Accurate as of Feb 20, 2023 10:36 AM. If you have any questions, ask your nurse or doctor.          STOP taking these medications    HYDROcodone-acetaminophen 5-325 MG tablet Commonly known as: NORCO/VICODIN       TAKE these medications    finasteride 5 MG tablet Commonly known as: PROSCAR Take 1 tablet by mouth once daily   metoprolol succinate 25 MG 24 hr tablet Commonly known as: TOPROL-XL Take 25 mg by mouth daily.   NON FORMULARY CPAP Nightly.   OVER THE COUNTER MEDICATION Potassium daily   valsartan-hydrochlorothiazide 320-25 MG tablet Commonly known as: DIOVAN-HCT Take 1 tablet by mouth daily.   Xarelto 20 MG Tabs tablet Generic drug: rivaroxaban SMARTSIG:1 Tablet(s) By Mouth Every Evening        Allergies:  Allergies  Allergen Reactions   Cyclobenzaprine Other (See Comments)    Acute urinary retention    Family History: Family History  Problem Relation Age of Onset   Colon cancer Mother        died age 93   Hypertension Father    Prostate cancer Father 21    Social History:  reports that he quit smoking about 35 years ago. His smoking use included cigarettes. He has a 2.50 pack-year smoking history. He has never used smokeless tobacco. He reports that he does not drink alcohol and does not use drugs.   Physical Exam: BP 132/82   Pulse 62   Ht 6\' 2"  (1.88 m)   Wt 240 lb 6 oz (109 kg)   BMI 30.86 kg/m   Constitutional:  Alert and oriented, No acute distress. HEENT: [place] AT, moist mucus membranes.  Trachea midline, no masses. Neurologic: Grossly intact, no focal deficits, moving all 4 extremities. Psychiatric: Normal mood and  affect.   Assessment & Plan:    BPH with outlet obstruction  - S/p HoLep with excellent outcome  - New PSA baseline following surgery is 0.3.   - Plan to stop taking Finasteride at this point in light of minimal to no urinary symptoms, discussed risk and benefits of this.  He prefers to discontinue which is very  reasonable. - Explained his PSA will double in 1 year, perhaps reaching 0.6.  -Will have his PCP check it next year. If it is significantly more than doubled she will refer him back here.  Return if symptoms worsen or fail to improve.  I have reviewed the above documentation for accuracy and completeness, and I agree with the above.   Vanna Scotland, MD   [facility] [address] [place], [place] [address] [phone]

## 2023-03-07 ENCOUNTER — Ambulatory Visit (INDEPENDENT_AMBULATORY_CARE_PROVIDER_SITE_OTHER): Payer: BC Managed Care – PPO | Admitting: Internal Medicine

## 2023-03-07 ENCOUNTER — Encounter: Payer: Self-pay | Admitting: Internal Medicine

## 2023-03-07 VITALS — BP 110/70 | HR 64 | Ht 74.0 in | Wt 236.8 lb

## 2023-03-07 DIAGNOSIS — I10 Essential (primary) hypertension: Secondary | ICD-10-CM

## 2023-03-07 DIAGNOSIS — R7303 Prediabetes: Secondary | ICD-10-CM

## 2023-03-07 DIAGNOSIS — I480 Paroxysmal atrial fibrillation: Secondary | ICD-10-CM

## 2023-03-07 DIAGNOSIS — Z23 Encounter for immunization: Secondary | ICD-10-CM | POA: Diagnosis not present

## 2023-03-07 DIAGNOSIS — E782 Mixed hyperlipidemia: Secondary | ICD-10-CM | POA: Diagnosis not present

## 2023-03-07 DIAGNOSIS — Z0000 Encounter for general adult medical examination without abnormal findings: Secondary | ICD-10-CM | POA: Diagnosis not present

## 2023-03-07 DIAGNOSIS — D6869 Other thrombophilia: Secondary | ICD-10-CM

## 2023-03-07 DIAGNOSIS — Z1211 Encounter for screening for malignant neoplasm of colon: Secondary | ICD-10-CM | POA: Diagnosis not present

## 2023-03-07 MED ORDER — VALSARTAN-HYDROCHLOROTHIAZIDE 320-25 MG PO TABS
1.0000 | ORAL_TABLET | Freq: Every day | ORAL | 3 refills | Status: DC
Start: 2023-03-07 — End: 2024-03-11

## 2023-03-07 NOTE — Assessment & Plan Note (Addendum)
Stable exam with well controlled BP.  Currently taking Valsartan hct and metoprolol. Tolerating medications without concerns or side effects. Will continue to recommend low sodium diet and current regimen.

## 2023-03-07 NOTE — Assessment & Plan Note (Signed)
Doing well s/p ablation; he continues on Xarelto. He denies any episodes of Afib.

## 2023-03-07 NOTE — Progress Notes (Signed)
Date:  03/07/2023   Name:  John Hanna   DOB:  05-17-1961   MRN:  [id number]   Chief Complaint: Annual Exam John Hanna is a 62 y.o. male who presents today for his Complete Annual Exam. He feels well. He reports exercising- working. He reports he is sleeping well.   Colonoscopy: 01/2011  Immunization History  Administered Date(s) Administered   Fluad Quad(high Dose 65+) 09/06/2022   Influenza, Quadrivalent, Recombinant, Inj, Pf 09/02/2019   Influenza,inj,Quad PF,6+ Mos 08/24/2021   Influenza-Unspecified 07/20/2015, 07/30/2018   PFIZER Comirnaty(Gray Top)Covid-19 Tri-Sucrose Vaccine 05/24/2020, 06/14/2020   Tdap 02/12/2013, 03/07/2023   Zoster, Live 02/16/2014   Health Maintenance Due  Topic Date Due   Zoster Vaccines- Shingrix (1 of 2) Never done   COLONOSCOPY (Pts 45-76yrs Insurance coverage will need to be confirmed)  01/28/2014   COVID-19 Vaccine (3 - Pfizer risk series) 07/12/2020    Lab Results  Component Value Date   PSA1 0.3 02/13/2023   PSA1 1.4 03/06/2022   PSA1 3.6 08/31/2020   PSA 3.0 02/17/2015    Hypertension This is a chronic problem. The problem is controlled. Pertinent negatives include no chest pain, headaches, palpitations or shortness of breath. Past treatments include angiotensin blockers, diuretics and beta blockers. The current treatment provides significant improvement. Hypertensive end-organ damage includes CAD/MI. There is no history of kidney disease or CVA.    Lab Results  Component Value Date   NA 140 05/21/2022   K 3.3 (L) 05/21/2022   CO2 24 05/21/2022   GLUCOSE 103 (H) 05/21/2022   BUN 14 05/21/2022   CREATININE 1.20 05/21/2022   CALCIUM 9.4 05/21/2022   EGFR 76 03/06/2022   GFRNONAA >60 05/21/2022   Lab Results  Component Value Date   CHOL 108 03/06/2022   HDL 41 03/06/2022   LDLCALC 48 03/06/2022   TRIG 97 03/06/2022   CHOLHDL 2.6 03/06/2022   Lab Results  Component Value Date   TSH 1.655 07/13/2020   Lab  Results  Component Value Date   HGBA1C 6.2 (A) 09/06/2022   Lab Results  Component Value Date   WBC 10.2 05/21/2022   HGB 13.8 05/21/2022   HCT 42.1 05/21/2022   MCV 89.6 05/21/2022   PLT 271 05/21/2022   Lab Results  Component Value Date   ALT 26 03/06/2022   AST 16 03/06/2022   ALKPHOS 51 03/06/2022   BILITOT 0.5 03/06/2022   No results found for: "25OHVITD2", "25OHVITD3", "VD25OH"   Review of Systems  Constitutional:  Negative for appetite change, chills, diaphoresis, fatigue and unexpected weight change.  HENT:  Negative for hearing loss, tinnitus, trouble swallowing and voice change.   Eyes:  Negative for visual disturbance.  Respiratory:  Negative for choking, shortness of breath and wheezing.   Cardiovascular:  Negative for chest pain, palpitations and leg swelling.  Gastrointestinal:  Negative for abdominal pain, blood in stool, constipation and diarrhea.  Genitourinary:  Negative for difficulty urinating, dysuria and frequency.  Musculoskeletal:  Negative for arthralgias, back pain and myalgias.  Skin:  Negative for color change and rash.  Neurological:  Negative for dizziness, syncope and headaches.  Hematological:  Negative for adenopathy.  Psychiatric/Behavioral:  Negative for dysphoric mood and sleep disturbance. The patient is not nervous/anxious.     Patient Active Problem List   Diagnosis Date Noted   Mixed hyperlipidemia 03/06/2022   Prediabetes 03/07/2021   Acquired thrombophilia (HCC) 03/03/2021   S/P ablation of atrial fibrillation 12/01/2020   Coronary artery disease  involving native coronary artery of native heart without angina pectoris 05/04/2020   Paroxysmal atrial fibrillation (HCC) 02/26/2020   OSA on CPAP 06/20/2019   BPH associated with nocturia 02/21/2018   Essential (primary) hypertension 07/26/2015   History of colon polyps 07/26/2015   H/O malignant neoplasm of skin 07/26/2015   Plantar fasciitis 07/26/2015    Allergies  Allergen  Reactions   Cyclobenzaprine Other (See Comments)    Acute urinary retention    Past Surgical History:  Procedure Laterality Date   CARDIAC ELECTROPHYSIOLOGY STUDY AND ABLATION  07/20/2020   COLONOSCOPY  01/2011   repeat 3 yrs - @ [facility]   HERNIA REPAIR  1992   HOLEP-LASER ENUCLEATION OF THE PROSTATE WITH MORCELLATION N/A 07/03/2022   Procedure: HOLEP-LASER ENUCLEATION OF THE PROSTATE WITH MORCELLATION;  Surgeon: Vanna Scotland, MD;  Location: [facility] ORS;  Service: Urology;  Laterality: N/A;   LEFT HEART CATH AND CORONARY ANGIOGRAPHY N/A 07/15/2020   Procedure: LEFT HEART CATH AND CORONARY ANGIOGRAPHY;  Surgeon: Lamar Blinks, MD;  Location: [facility] INVASIVE CV LAB;  Service: Cardiovascular;  Laterality: N/A;    Social History   Tobacco Use   Smoking status: Former    Packs/day: 0.50    Years: 5.00    Additional pack years: 0.00    Total pack years: 2.50    Types: Cigarettes    Quit date: 10/17/1987    Years since quitting: 35.4   Smokeless tobacco: Never  Vaping Use   Vaping Use: Never used  Substance Use Topics   Alcohol use: No    Alcohol/week: 0.0 standard drinks of alcohol   Drug use: Never     Medication list has been reviewed and updated.  Current Meds  Medication Sig   atorvastatin (LIPITOR) 40 MG tablet Take 40 mg by mouth daily.   metoprolol succinate (TOPROL-XL) 25 MG 24 hr tablet Take 25 mg by mouth daily.   NON FORMULARY CPAP Nightly.   OVER THE COUNTER MEDICATION Potassium daily   XARELTO 20 MG TABS tablet SMARTSIG:1 Tablet(s) By Mouth Every Evening   [DISCONTINUED] finasteride (PROSCAR) 5 MG tablet Take 1 tablet by mouth once daily   [DISCONTINUED] valsartan-hydrochlorothiazide (DIOVAN-HCT) 320-25 MG tablet Take 1 tablet by mouth daily.       03/07/2023   10:06 AM 12/01/2022    1:54 PM 09/06/2022   10:41 AM 03/06/2022    9:13 AM  GAD 7 : Generalized Anxiety Score  Nervous, Anxious, on Edge 0 0 0 0  Control/stop worrying 0 0 0 0   Worry too much - different things 0 0 0 0  Trouble relaxing 0 0 0 0  Restless 0 0 0 0  Easily annoyed or irritable 0 0 0 0  Afraid - awful might happen 0 0 0 0  Total GAD 7 Score 0 0 0 0  Anxiety Difficulty Not difficult at all Not difficult at all Not difficult at all Not difficult at all       03/07/2023   10:06 AM 12/01/2022    1:54 PM 09/06/2022   10:41 AM  Depression screen PHQ 2/9  Decreased Interest 0 0 0  Down, Depressed, Hopeless 0 0 0  PHQ - 2 Score 0 0 0  Altered sleeping 0 0 0  Tired, decreased energy 0 1 1  Change in appetite 0 0 0  Feeling bad or failure about yourself  0 0 0  Trouble concentrating 0 0 0  Moving slowly or fidgety/restless 0  0 0  Suicidal thoughts 0 0 0  PHQ-9 Score 0 1 1  Difficult doing work/chores Not difficult at all Not difficult at all Not difficult at all    BP Readings from Last 3 Encounters:  03/07/23 110/70  02/20/23 132/82  12/01/22 127/82    Physical Exam Vitals and nursing note reviewed.  Constitutional:      Appearance: Normal appearance. He is well-developed.  HENT:     Head: Normocephalic.     Right Ear: Tympanic membrane, ear canal and external ear normal.     Left Ear: Tympanic membrane, ear canal and external ear normal.     Nose: Nose normal.  Eyes:     Conjunctiva/sclera: Conjunctivae normal.     Pupils: Pupils are equal, round, and reactive to light.  Neck:     Thyroid: No thyromegaly.     Vascular: No carotid bruit.  Cardiovascular:     Rate and Rhythm: Normal rate and regular rhythm.     Heart sounds: Normal heart sounds.  Pulmonary:     Effort: Pulmonary effort is normal.     Breath sounds: Normal breath sounds. No wheezing.  Chest:  Breasts:    Right: No mass.     Left: No mass.  Abdominal:     General: Bowel sounds are normal.     Palpations: Abdomen is soft.     Tenderness: There is no abdominal tenderness.  Musculoskeletal:        General: Normal range of motion.     Cervical back: Normal  range of motion and neck supple.  Lymphadenopathy:     Cervical: No cervical adenopathy.  Skin:    General: Skin is warm and dry.  Neurological:     Mental Status: He is alert and oriented to person, place, and time.     Deep Tendon Reflexes: Reflexes are normal and symmetric.  Psychiatric:        Attention and Perception: Attention normal.        Mood and Affect: Mood normal.        Thought Content: Thought content normal.     Wt Readings from Last 3 Encounters:  03/07/23 236 lb 12.8 oz (107.4 kg)  02/20/23 240 lb 6 oz (109 kg)  12/01/22 244 lb (110.7 kg)    BP 110/70   Pulse 64   Ht 6\' 2"  (1.88 m)   Wt 236 lb 12.8 oz (107.4 kg)   SpO2 96%   BMI 30.40 kg/m   Assessment and Plan:  Problem List Items Addressed This Visit     Prediabetes (Chronic)    Controlled with diet changes. Lab Results  Component Value Date   HGBA1C 6.2 (A) 09/06/2022        Relevant Orders   Hemoglobin A1c   Paroxysmal atrial fibrillation (HCC) (Chronic)    Doing well s/p ablation; he continues on Xarelto. He denies any episodes of Afib.      Relevant Medications   atorvastatin (LIPITOR) 40 MG tablet   valsartan-hydrochlorothiazide (DIOVAN-HCT) 320-25 MG tablet   Mixed hyperlipidemia (Chronic)    Tolerating statin medications.  No side effects noted. LDL is  Lab Results  Component Value Date   LDLCALC 48 03/06/2022  Will advise if dose change is indicated.       Relevant Medications   atorvastatin (LIPITOR) 40 MG tablet   valsartan-hydrochlorothiazide (DIOVAN-HCT) 320-25 MG tablet   Other Relevant Orders   Lipid panel   Essential (primary) hypertension (Chronic)  Stable exam with well controlled BP.  Currently taking Valsartan hct and metoprolol. Tolerating medications without concerns or side effects. Will continue to recommend low sodium diet and current regimen.       Relevant Medications   atorvastatin (LIPITOR) 40 MG tablet   valsartan-hydrochlorothiazide  (DIOVAN-HCT) 320-25 MG tablet   Other Relevant Orders   CBC with Differential/Platelet   Comprehensive metabolic panel   Acquired thrombophilia (HCC) (Chronic)   Other Visit Diagnoses     Annual physical exam    -  Primary   Relevant Orders   CBC with Differential/Platelet   Comprehensive metabolic panel   Hemoglobin A1c   Lipid panel   Colon cancer screening       Relevant Orders   Ambulatory referral to Gastroenterology   Need for diphtheria-tetanus-pertussis (Tdap) vaccine       Relevant Orders   Tdap vaccine greater than or equal to 7yo IM (Completed)       Return in about 6 months (around 09/07/2023) for HTN.   Partially dictated using Dragon software, any errors are not intentional.  Reubin Milan, MD [facility] [place], [place]

## 2023-03-07 NOTE — Assessment & Plan Note (Signed)
Controlled with diet changes. Lab Results  Component Value Date   HGBA1C 6.2 (A) 09/06/2022

## 2023-03-07 NOTE — Assessment & Plan Note (Addendum)
Tolerating statin medications.  No side effects noted. LDL is  Lab Results  Component Value Date   LDLCALC 48 03/06/2022   Will advise if dose change is indicated.

## 2023-03-08 ENCOUNTER — Telehealth: Payer: Self-pay

## 2023-03-08 ENCOUNTER — Other Ambulatory Visit: Payer: Self-pay

## 2023-03-08 DIAGNOSIS — Z86010 Personal history of colonic polyps: Secondary | ICD-10-CM

## 2023-03-08 LAB — CBC WITH DIFFERENTIAL/PLATELET
Basophils Absolute: 0 10*3/uL (ref 0.0–0.2)
Basos: 0 %
EOS (ABSOLUTE): 0.3 10*3/uL (ref 0.0–0.4)
Eos: 3 %
Hematocrit: 41.1 % (ref 37.5–51.0)
Hemoglobin: 13.5 g/dL (ref 13.0–17.7)
Immature Grans (Abs): 0 10*3/uL (ref 0.0–0.1)
Immature Granulocytes: 0 %
Lymphocytes Absolute: 2.4 10*3/uL (ref 0.7–3.1)
Lymphs: 25 %
MCH: 30.1 pg (ref 26.6–33.0)
MCHC: 32.8 g/dL (ref 31.5–35.7)
MCV: 92 fL (ref 79–97)
Monocytes Absolute: 0.6 10*3/uL (ref 0.1–0.9)
Monocytes: 7 %
Neutrophils Absolute: 5.9 10*3/uL (ref 1.4–7.0)
Neutrophils: 65 %
Platelets: 234 10*3/uL (ref 150–450)
RBC: 4.49 x10E6/uL (ref 4.14–5.80)
RDW: 12.8 % (ref 11.6–15.4)
WBC: 9.3 10*3/uL (ref 3.4–10.8)

## 2023-03-08 LAB — COMPREHENSIVE METABOLIC PANEL
ALT: 40 IU/L (ref 0–44)
AST: 24 IU/L (ref 0–40)
Albumin/Globulin Ratio: 1.5 (ref 1.2–2.2)
Albumin: 4.3 g/dL (ref 3.8–4.9)
Alkaline Phosphatase: 55 IU/L (ref 44–121)
BUN/Creatinine Ratio: 15 (ref 9–20)
BUN: 17 mg/dL (ref 6–24)
Bilirubin Total: 0.8 mg/dL (ref 0.0–1.2)
CO2: 23 mmol/L (ref 20–29)
Calcium: 9.6 mg/dL (ref 8.7–10.2)
Chloride: 101 mmol/L (ref 96–106)
Creatinine, Ser: 1.11 mg/dL (ref 0.76–1.27)
Globulin, Total: 2.9 g/dL (ref 1.5–4.5)
Glucose: 102 mg/dL — ABNORMAL HIGH (ref 70–99)
Potassium: 4.2 mmol/L (ref 3.5–5.2)
Sodium: 140 mmol/L (ref 134–144)
Total Protein: 7.2 g/dL (ref 6.0–8.5)
eGFR: 76 mL/min/{1.73_m2} (ref >59)

## 2023-03-08 LAB — LIPID PANEL
Chol/HDL Ratio: 2.7 ratio (ref 0.0–5.0)
Cholesterol, Total: 100 mg/dL (ref 100–199)
HDL: 37 mg/dL — ABNORMAL LOW (ref >39)
LDL Chol Calc (NIH): 41 mg/dL (ref 0–99)
Triglycerides: 124 mg/dL (ref 0–149)
VLDL Cholesterol Cal: 22 mg/dL (ref 5–40)

## 2023-03-08 LAB — HEMOGLOBIN A1C
Est. average glucose Bld gHb Est-mCnc: 140 mg/dL
Hgb A1c MFr Bld: 6.5 % — ABNORMAL HIGH (ref 4.8–5.6)

## 2023-03-08 MED ORDER — NA SULFATE-K SULFATE-MG SULF 17.5-3.13-1.6 GM/177ML PO SOLN: 1.0000 | Freq: Once | ORAL | 0 refills | Status: AC

## 2023-03-08 NOTE — Telephone Encounter (Signed)
Gastroenterology Pre-Procedure Review  Request Date: 03/28/23 Requesting Physician: Dr. Tobi Bastos  PATIENT REVIEW QUESTIONS: The patient responded to the following health history questions as indicated:    1. Are you having any GI issues? no 2. Do you have a personal history of Polyps? yes (last colonoscopy 12 years ago adenoma was noted) 3. Do you have a family history of Colon Cancer or Polyps? no 4. Diabetes Mellitus? no 5. Joint replacements in the past 12 months?no 6. Major health problems in the past 3 months?no 7. Any artificial heart valves, MVP, or defibrillator?no    MEDICATIONS & ALLERGIES:    Patient reports the following regarding taking any anticoagulation/antiplatelet therapy:   Plavix, Coumadin, Eliquis, Xarelto, Lovenox, Pradaxa, Brilinta, or Effient? Yes Pt takes Xarelto clearance sent to [facility] Aspirin? no  Patient confirms/reports the following medications:  Current Outpatient Medications  Medication Sig Dispense Refill   atorvastatin (LIPITOR) 40 MG tablet Take 40 mg by mouth daily.     metoprolol succinate (TOPROL-XL) 25 MG 24 hr tablet Take 25 mg by mouth daily.     NON FORMULARY CPAP Nightly.     OVER THE COUNTER MEDICATION Potassium daily     valsartan-hydrochlorothiazide (DIOVAN-HCT) 320-25 MG tablet Take 1 tablet by mouth daily. 90 tablet 3   XARELTO 20 MG TABS tablet SMARTSIG:1 Tablet(s) By Mouth Every Evening     No current facility-administered medications for this visit.    Patient confirms/reports the following allergies:  Allergies  Allergen Reactions   Cyclobenzaprine Other (See Comments)    Acute urinary retention    No orders of the defined types were placed in this encounter.   AUTHORIZATION INFORMATION Primary Insurance: 1D#: Group #:  Secondary Insurance: 1D#: Group #:  SCHEDULE INFORMATION: Date: 03/28/23 Time: Location: [facility]

## 2023-03-22 ENCOUNTER — Encounter: Payer: Self-pay | Admitting: Gastroenterology

## 2023-03-28 ENCOUNTER — Ambulatory Visit
Admission: RE | Admit: 2023-03-28 | Discharge: 2023-03-28 | Disposition: A | Discharge status: Home / Self Care | Payer: BC Managed Care – PPO | Attending: Gastroenterology | Admitting: Gastroenterology

## 2023-03-28 ENCOUNTER — Ambulatory Visit: Payer: BC Managed Care – PPO | Admitting: Anesthesiology

## 2023-03-28 ENCOUNTER — Encounter
Admission: RE | Disposition: A | Discharge status: Home / Self Care | Payer: Self-pay | Source: Home / Self Care | Attending: Gastroenterology

## 2023-03-28 DIAGNOSIS — D126 Benign neoplasm of colon, unspecified: Secondary | ICD-10-CM | POA: Diagnosis not present

## 2023-03-28 DIAGNOSIS — Z87891 Personal history of nicotine dependence: Secondary | ICD-10-CM | POA: Insufficient documentation

## 2023-03-28 DIAGNOSIS — D120 Benign neoplasm of cecum: Secondary | ICD-10-CM | POA: Insufficient documentation

## 2023-03-28 DIAGNOSIS — Z86010 Personal history of colonic polyps: Secondary | ICD-10-CM | POA: Insufficient documentation

## 2023-03-28 DIAGNOSIS — K5730 Diverticulosis of large intestine without perforation or abscess without bleeding: Secondary | ICD-10-CM | POA: Diagnosis not present

## 2023-03-28 DIAGNOSIS — Z800 Family history of malignant neoplasm of digestive organs: Secondary | ICD-10-CM | POA: Diagnosis not present

## 2023-03-28 DIAGNOSIS — Z1211 Encounter for screening for malignant neoplasm of colon: Secondary | ICD-10-CM | POA: Insufficient documentation

## 2023-03-28 HISTORY — PX: COLONOSCOPY WITH PROPOFOL: SHX5780

## 2023-03-28 SURGERY — COLONOSCOPY WITH PROPOFOL
Anesthesia: General

## 2023-03-28 MED ORDER — DEXMEDETOMIDINE HCL IN NACL 80 MCG/20ML IV SOLN
INTRAVENOUS | Status: DC | PRN
  Administered 2023-03-28: 12 ug via INTRAVENOUS

## 2023-03-28 MED ORDER — LIDOCAINE HCL (CARDIAC) PF 100 MG/5ML IV SOSY
PREFILLED_SYRINGE | INTRAVENOUS | Status: DC | PRN
  Administered 2023-03-28: 50 mg via INTRAVENOUS

## 2023-03-28 MED ORDER — PROPOFOL 10 MG/ML IV BOLUS
INTRAVENOUS | Status: AC
  Filled 2023-03-28: qty 20

## 2023-03-28 MED ORDER — PROPOFOL 10 MG/ML IV BOLUS
INTRAVENOUS | Status: DC | PRN
  Administered 2023-03-28: 80 mg via INTRAVENOUS
  Administered 2023-03-28 (×2): 20 mg via INTRAVENOUS

## 2023-03-28 MED ORDER — SODIUM CHLORIDE 0.9 % IV SOLN
INTRAVENOUS | Status: DC
  Administered 2023-03-28: 20 mL/h via INTRAVENOUS

## 2023-03-28 MED ORDER — LIDOCAINE HCL (PF) 2 % IJ SOLN
INTRAMUSCULAR | Status: AC
  Filled 2023-03-28: qty 5

## 2023-03-28 MED ORDER — PROPOFOL 500 MG/50ML IV EMUL
INTRAVENOUS | Status: DC | PRN
  Administered 2023-03-28: 100 ug/kg/min via INTRAVENOUS

## 2023-03-28 NOTE — Anesthesia Postprocedure Evaluation (Signed)
Anesthesia Post Note  Patient: John Hanna  Procedure(s) Performed: COLONOSCOPY WITH PROPOFOL  Patient location during evaluation: PACU Anesthesia Type: General Level of consciousness: awake and alert, oriented and patient cooperative Pain management: pain level controlled Vital Signs Assessment: post-procedure vital signs reviewed and stable Respiratory status: spontaneous breathing, nonlabored ventilation and respiratory function stable Cardiovascular status: blood pressure returned to baseline and stable Postop Assessment: adequate PO intake Anesthetic complications: no   No notable events documented.   Last Vitals:  Vitals:   03/28/23 1127 03/28/23 1139  BP: 103/67 103/69  Pulse:    Resp:    Temp:    SpO2:      Last Pain:  Vitals:   03/28/23 1139  TempSrc:   PainSc: 0-No pain                 Reed Breech

## 2023-03-28 NOTE — Op Note (Signed)
[facility] Patient Name: John Hanna Procedure Date: 03/28/2023 10:43 AM MRN: [id number] Account #: [id number] Date of Birth: 01-12-1961 Admit Type: Outpatient Age: 62 Room: [facility] ENDO ROOM 3 Gender: Male Note Status: Finalized Instrument Name: Prentice Docker [id number] Procedure:             Colonoscopy Indications:           Surveillance: Personal history of adenomatous polyps                         on last colonoscopy > 5 years ago, Family history of                         colon cancer in a first-degree relative before age 69                         years Providers:             Wyline Mood MD, MD Referring MD:          Bari Edward, MD (Referring MD) Medicines:             Monitored Anesthesia Care Complications:         No immediate complications. Procedure:             Pre-Anesthesia Assessment:                        - Prior to the procedure, a History and Physical was                         performed, and patient medications, allergies and                         sensitivities were reviewed. The patient's tolerance                         of previous anesthesia was reviewed.                        - The risks and benefits of the procedure and the                         sedation options and risks were discussed with the                         patient. All questions were answered and informed                         consent was obtained.                        - ASA Grade Assessment: II - A patient with mild                         systemic disease.                        After obtaining informed consent, the colonoscope was                         passed  under direct vision. Throughout the procedure,                         the patient's blood pressure, pulse, and oxygen                         saturations were monitored continuously. The                         Colonoscope was introduced through the anus and                          advanced to the the cecum, identified by the                         appendiceal orifice. The colonoscopy was performed                         with ease. The patient tolerated the procedure well.                         The quality of the bowel preparation was excellent.                         The ileocecal valve, appendiceal orifice, and rectum                         were photographed. Findings:      The perianal and digital rectal examinations were normal.      A 5 mm polyp was found in the cecum. The polyp was sessile. The polyp       was removed with a cold snare. Resection and retrieval were complete.      Multiple medium-mouthed diverticula were found in the sigmoid colon.      The exam was otherwise without abnormality on direct and retroflexion       views. Impression:            - One 5 mm polyp in the cecum, removed with a cold                         snare. Resected and retrieved.                        - Diverticulosis in the sigmoid colon.                        - The examination was otherwise normal on direct and                         retroflexion views. Recommendation:        - Discharge patient to home (with escort).                        - Resume previous diet.                        - Continue present medications.                        -  Await pathology results.                        - Repeat colonoscopy in 5 years for surveillance based                         on pathology results. Procedure Code(s):     --- Professional ---                        [phone], Colonoscopy, flexible; with removal of                         tumor(s), polyp(s), or other lesion(s) by snare                         technique Diagnosis Code(s):     --- Professional ---                        Z86.010, Personal history of colonic polyps                        D12.0, Benign neoplasm of cecum                        K57.30, Diverticulosis of large intestine without                          perforation or abscess without bleeding                        Z80.0, Family history of malignant neoplasm of                         digestive organs CPT copyright 2022 American Medical Association. All rights reserved. The codes documented in this report are preliminary and upon coder review may  be revised to meet current compliance requirements. Wyline Mood, MD Wyline Mood MD, MD 03/28/2023 11:20:16 AM This report has been signed electronically. Number of Addenda: 0 Note Initiated On: 03/28/2023 10:43 AM Scope Withdrawal Time: 0 hours 8 minutes 38 seconds  Total Procedure Duration: 0 hours 19 minutes 49 seconds  Estimated Blood Loss:  Estimated blood loss: none.      [facility]

## 2023-03-28 NOTE — Transfer of Care (Signed)
Immediate Anesthesia Transfer of Care Note  Patient: John Hanna  Procedure(s) Performed: COLONOSCOPY WITH PROPOFOL  Patient Location: PACU and Endoscopy Unit  Anesthesia Type:General  Level of Consciousness: awake, oriented, and patient cooperative  Airway & Oxygen Therapy: Patient Spontanous Breathing  Post-op Assessment: Report given to RN and Post -op Vital signs reviewed and stable  Post vital signs: Reviewed and stable  Last Vitals:  Vitals Value Taken Time  BP 93/48 1122  Temp    Pulse 70 03/28/23 1122  Resp 17 03/28/23 1122  SpO2 99 % 03/28/23 1122  Vitals shown include unvalidated device data.  Last Pain:  Vitals:   03/28/23 1017  TempSrc: Temporal  PainSc: 0-No pain         Complications: No notable events documented.

## 2023-03-28 NOTE — Anesthesia Preprocedure Evaluation (Addendum)
Anesthesia Evaluation  Patient identified by MRN, date of birth, ID band Patient awake    Reviewed: Allergy & Precautions, NPO status , Patient's Chart, lab work & pertinent test results  History of Anesthesia Complications Negative for: history of anesthetic complications  Airway Mallampati: III   Neck ROM: Full    Dental  (+) Missing   Pulmonary sleep apnea and Continuous Positive Airway Pressure Ventilation , former smoker (quit 1989)   Pulmonary exam normal breath sounds clear to auscultation       Cardiovascular hypertension, + CAD  Normal cardiovascular exam+ dysrhythmias (a fib on Xarelto) Supra Ventricular Tachycardia  Rhythm:Regular Rate:Normal  ECG 06/07/22: Sinus bradycardia  Early precordial QRS transition  Otherwise normal ECG     Neuro/Psych negative neurological ROS     GI/Hepatic negative GI ROS,,,  Endo/Other  negative endocrine ROS    Renal/GU negative Renal ROS   BPH    Musculoskeletal  (+) Arthritis ,    Abdominal   Peds  Hematology negative hematology ROS (+)   Anesthesia Other Findings Cardiology note 06/07/22:  Assessment/Plan: 1. Atrial fibrillation, now status post ablation. He has not had recurrence of palpitations or arrhythmia that he is aware of. He is in sinus rhythm today. He feels well from a cardiac perspective.   Review of cardiac stress MRI from 2020 with abnormal adenosine stress perfusion imaging. He later had follow-up with Dr. Lady Gary who reviewed and did not recommend further intervention due to small vessel disease and he has continued medical management.   He continues on Xarelto 20 mg daily, which we will continue. Recent CBC and creatinine were normal. He can hold Xarelto for 3 days preop and restart at the discretion of his urologist once his bleeding risk is low. He is cleared from a cardiac perspective to proceed with procedure. All questions and concerns were addressed  today. He will call with recurrence of atrial fibrillation. Otherwise, he will follow up in 1 year.  I spent a total of 35 minutes in both face-to-face and nonface-to-face activities for this visit on the date of this encounter.  Follow-up: 1 year.    Reproductive/Obstetrics                             Anesthesia Physical Anesthesia Plan  ASA: 3  Anesthesia Plan: General   Post-op Pain Management:    Induction: Intravenous  PONV Risk Score and Plan: 2 and Propofol infusion, TIVA and Treatment may vary due to age or medical condition  Airway Management Planned: Natural Airway  Additional Equipment:   Intra-op Plan:   Post-operative Plan:   Informed Consent: I have reviewed the patients History and Physical, chart, labs and discussed the procedure including the risks, benefits and alternatives for the proposed anesthesia with the patient or authorized representative who has indicated his/her understanding and acceptance.       Plan Discussed with: CRNA  Anesthesia Plan Comments: (LMA/GETA backup discussed.  Patient consented for risks of anesthesia including but not limited to:  - adverse reactions to medications - damage to eyes, teeth, lips or other oral mucosa - nerve damage due to positioning  - sore throat or hoarseness - damage to heart, brain, nerves, lungs, other parts of body or loss of life  Informed patient about role of CRNA in peri- and intra-operative care.  Patient voiced understanding.)        Anesthesia Quick Evaluation

## 2023-03-28 NOTE — H&P (Signed)
Wyline Mood, MD [address], [place], [place], [address] [address], [place], [place], [address] Phone: [phone]  Fax: [phone]  Primary Care Physician:  Reubin Milan, MD   Pre-Procedure History & Physical: HPI:  John Hanna is a 62 y.o. male is here for an colonoscopy.   Past Medical History:  Diagnosis Date   Acute urinary retention 07/13/2020   Arthritis    Atrial fibrillation (HCC)    a.) CHA2DS2VASc = 2 (age, HTN);  b.) 07/20/2020 s/p WACA PVI ([place]) with additional line of ablation just anterior to ligament of [place] Face given thickness of ridge;  c.) rate/John maintained on oral metoprolol succinate; chronically anticoagulated with rivaroxaban   BPH (benign prostatic hyperplasia)    Colon polyps    Coronary artery disease 07/15/2020   a.) LHC 07/15/2020: 20% o-pLAD, 15% pLM, 20% mLM - med mgmt; b.) MPI 04/24/2019: EF 46%, no ischemic changes; c.) cMRI 10/15/2019: EF 55%, mild BAE, no LGE, bas-mid inferolateral perfusion defect c/w ischemia in OM of LCx or PLB of RCA.   Diastolic dysfunction 04/24/2019   a.) TTE 04/24/2019: EF 45%, glob HK, mild LAE, triv PR, mild MR/TR, G1DD   DOE (dyspnea on exertion)    HLD (hyperlipidemia)    Hx of skin cancer, basal cell    Hypertension    LBBB (left bundle branch block)    Long term current use of anticoagulant    a.) rivaroxaban   OSA on CPAP    Palpitations    Plantar fasciitis    PSVT (paroxysmal supraventricular tachycardia)    SOB (shortness of breath)    Tachycardia-bradycardia syndrome (HCC)     Past Surgical History:  Procedure Laterality Date   CARDIAC ELECTROPHYSIOLOGY STUDY AND ABLATION  07/20/2020   COLONOSCOPY  01/2011   repeat 3 yrs - @ [facility]   HERNIA REPAIR  1992   HOLEP-LASER ENUCLEATION OF THE PROSTATE WITH MORCELLATION N/A 07/03/2022   Procedure: HOLEP-LASER ENUCLEATION OF THE PROSTATE WITH MORCELLATION;  Surgeon: Vanna Scotland, MD;   Location: [facility] ORS;  Service: Urology;  Laterality: N/A;   LEFT HEART CATH AND CORONARY ANGIOGRAPHY N/A 07/15/2020   Procedure: LEFT HEART CATH AND CORONARY ANGIOGRAPHY;  Surgeon: Lamar Blinks, MD;  Location: [facility]IVE CV [facility];  Service: Cardiovascular;  Laterality: N/A;    Prior to Admission medications   Medication Sig Start Date End Date Taking? Authorizing Provider  atorvastatin (LIPITOR) 40 MG tablet Take 40 mg by mouth daily.    [provider]  metoprolol succinate (TOPROL-XL) 25 MG 24 hr tablet Take 25 mg by mouth daily. 01/22/22   [provider]  NON FORMULARY CPAP Nightly.    [provider]  OVER THE COUNTER MEDICATION Potassium daily    [provider]  valsartan-hydrochlorothiazide (DIOVAN-HCT) 320-25 MG tablet Take 1 tablet by mouth daily. 03/07/23   Reubin Milan, MD  XARELTO 20 MG TABS tablet SMARTSIG:1 Tablet(s) By Mouth Every Evening 02/21/21   [provider]    Allergies as of 03/08/2023 - Review Complete 03/08/2023  Allergen Reaction Noted   Cyclobenzaprine Other (See Comments) 09/02/2018    Family History  Problem Relation Age of Onset   Colon cancer Mother        died age 15   Hypertension Father    Prostate cancer Father 89    Social History   Socioeconomic History   Marital status: Married    Spouse name: carla  Number of children: Not on file   Years of education: Not on file   Highest education level: Not on file  Occupational History   Occupation: retired  Tobacco Use   Smoking status: Former    Packs/day: 0.50    Years: 5.00    Additional pack years: 0.00    Total pack years: 2.50    Types: Cigarettes    Quit date: 10/17/1987    Years since quitting: 35.4   Smokeless tobacco: Never  Vaping Use   Vaping Use: Never used  Substance and Sexual Activity   Alcohol use: No    Alcohol/week: 0.0 standard drinks of alcohol   Drug use: Never   Sexual activity: Not Currently    Birth  control/protection: None  Other Topics Concern   Not on file  Social History Narrative   Not on file   Social Determinants of Health   Financial Resource Strain: Low Risk  (09/06/2022)   Overall Financial Resource Strain (CARDIA)    Difficulty of Paying Living Expenses: Not hard at all  Food Insecurity: No Food Insecurity (09/06/2022)   Hunger Vital Sign    Worried About Running Out of Food in the Last Year: Never true    Ran Out of Food in the Last Year: Never true  Transportation Needs: No Transportation Needs (09/06/2022)   PRAPARE - Administrator, Civil Service (Medical): No    Lack of Transportation (Non-Medical): No  Physical Activity: Not on file  Stress: Not on file  Social Connections: Not on file  Intimate Partner Violence: Not At Risk (09/06/2022)   Humiliation, Afraid, Rape, and Kick questionnaire    Fear of Current or Ex-Partner: No    Emotionally Abused: No    Physically Abused: No    Sexually Abused: No    Review of Systems: See HPI, otherwise negative ROS  Physical Exam: There were no vitals taken for this visit. General:   Alert,  pleasant and cooperative in NAD Head:  Normocephalic and atraumatic. Neck:  Supple; no masses or thyromegaly. Lungs:  Clear throughout to auscultation, normal respiratory effort.    Heart:  +S1, +S2, Regular rate and John, No edema. Abdomen:  Soft, nontender and nondistended. Normal bowel sounds, without guarding, and without rebound.   Neurologic:  Alert and  oriented x4;  grossly normal neurologically.  Impression/Plan: John Hanna is here for an colonoscopy to be performed for surveillance due to prior history of colon polyps   Risks, benefits, limitations, and alternatives regarding  colonoscopy have been reviewed with the patient.  Questions have been answered.  All parties agreeable.   Wyline Mood, MD  03/28/2023, 10:04 AM

## 2023-03-29 ENCOUNTER — Encounter: Payer: Self-pay | Admitting: Gastroenterology

## 2023-07-13 ENCOUNTER — Ambulatory Visit: Payer: BC Managed Care – PPO | Admitting: Internal Medicine

## 2023-08-08 ENCOUNTER — Encounter: Payer: Self-pay | Admitting: Internal Medicine

## 2023-08-08 ENCOUNTER — Ambulatory Visit: Payer: BC Managed Care – PPO | Admitting: Internal Medicine

## 2023-08-08 VITALS — BP 112/78 | HR 61 | Ht 74.0 in | Wt 228.0 lb

## 2023-08-08 DIAGNOSIS — Z23 Encounter for immunization: Secondary | ICD-10-CM | POA: Diagnosis not present

## 2023-08-08 DIAGNOSIS — I480 Paroxysmal atrial fibrillation: Secondary | ICD-10-CM

## 2023-08-08 DIAGNOSIS — R7303 Prediabetes: Secondary | ICD-10-CM | POA: Diagnosis not present

## 2023-08-08 DIAGNOSIS — I10 Essential (primary) hypertension: Secondary | ICD-10-CM

## 2023-08-08 LAB — POCT GLYCOSYLATED HEMOGLOBIN (HGB A1C): Hemoglobin A1C: 6 % — AB (ref 4.0–5.6)

## 2023-08-08 NOTE — Assessment & Plan Note (Signed)
Normal exam with stable BP on valsartan and metoprolol. No concerns or side effects to current medication. No change in regimen; continue low sodium diet.

## 2023-08-08 NOTE — Assessment & Plan Note (Signed)
Not aware of any Afib Remains on Eliquis and metoprolol Consider tapering off of metoprolol if BP seems to be too low

## 2023-08-08 NOTE — Assessment & Plan Note (Addendum)
Blood sugars stable without hypoglycemic symptoms or events. Current regimen is diet.  He has lost a few pounds since last visit. Changes made last visit are none. Lab Results  Component Value Date   HGBA1C 6.5 (H) 03/07/2023  A1C today = 6.0

## 2023-08-08 NOTE — Progress Notes (Signed)
Date:  08/08/2023   Name:  John Hanna   DOB:  [birth year]   MRN:  [id number]   Chief Complaint: Hypertension and Diabetes  Hypertension This is a chronic problem. The problem is controlled. Pertinent negatives include no chest pain, headaches, palpitations or shortness of breath. Past treatments include angiotensin blockers and beta blockers. The current treatment provides significant improvement. Hypertensive end-organ damage includes CAD/MI. There is no history of kidney disease or CVA.  Diabetes He presents for his follow-up diabetic visit. He has type 2 diabetes mellitus. His disease course has been stable. Pertinent negatives for hypoglycemia include no headaches or tremors. Pertinent negatives for diabetes include no chest pain, no fatigue, no polydipsia and no polyuria. Pertinent negatives for diabetic complications include no CVA. Current diabetic treatment includes diet.    Review of Systems  Constitutional:  Negative for appetite change, fatigue and unexpected weight change.  Eyes:  Negative for visual disturbance.  Respiratory:  Negative for cough, shortness of breath and wheezing.   Cardiovascular:  Negative for chest pain, palpitations and leg swelling.  Gastrointestinal:  Negative for abdominal pain and blood in stool.  Endocrine: Negative for polydipsia and polyuria.  Genitourinary:  Negative for dysuria and hematuria.  Skin:  Negative for color change and rash.  Neurological:  Negative for tremors, numbness and headaches.  Psychiatric/Behavioral:  Negative for dysphoric mood.      Lab Results  Component Value Date   NA 140 03/07/2023   K 4.2 03/07/2023   CO2 23 03/07/2023   GLUCOSE 102 (H) 03/07/2023   BUN 17 03/07/2023   CREATININE 1.11 03/07/2023   CALCIUM 9.6 03/07/2023   EGFR 76 03/07/2023   GFRNONAA >60 05/21/2022   Lab Results  Component Value Date   CHOL 100 03/07/2023   HDL 37 (L) 03/07/2023   LDLCALC 41 03/07/2023   TRIG 124 03/07/2023    CHOLHDL 2.7 03/07/2023   Lab Results  Component Value Date   TSH 1.655 07/13/2020   Lab Results  Component Value Date   HGBA1C 6.0 (A) 08/08/2023   Lab Results  Component Value Date   WBC 9.3 03/07/2023   HGB 13.5 03/07/2023   HCT 41.1 03/07/2023   MCV 92 03/07/2023   PLT 234 03/07/2023   Lab Results  Component Value Date   ALT 40 03/07/2023   AST 24 03/07/2023   ALKPHOS 55 03/07/2023   BILITOT 0.8 03/07/2023   No results found for: "25OHVITD2", "25OHVITD3", "VD25OH"   Patient Active Problem List   Diagnosis Date Noted   Adenomatous polyp of colon 03/28/2023   Mixed hyperlipidemia 03/06/2022   Prediabetes 03/07/2021   Acquired thrombophilia (HCC) 03/03/2021   S/P ablation of atrial fibrillation 12/01/2020   Coronary artery disease involving native coronary artery of native heart without angina pectoris 05/04/2020   Paroxysmal atrial fibrillation (HCC) 02/26/2020   OSA on CPAP 06/20/2019   BPH associated with nocturia 02/21/2018   Essential (primary) hypertension 07/26/2015   History of colon polyps 07/26/2015   H/O malignant neoplasm of skin 07/26/2015   Plantar fasciitis 07/26/2015    Allergies  Allergen Reactions   Cyclobenzaprine Other (See Comments)    Acute urinary retention    Past Surgical History:  Procedure Laterality Date   CARDIAC ELECTROPHYSIOLOGY STUDY AND ABLATION  07/20/2020   COLONOSCOPY  01/2011   repeat 3 yrs - @ [facility]   COLONOSCOPY WITH PROPOFOL N/A 03/28/2023   Procedure: COLONOSCOPY WITH PROPOFOL;  Surgeon: Wyline Mood,  MD;  Location: [facility];  Service: Gastroenterology;  Laterality: N/A;   HERNIA REPAIR  1992   HOLEP-LASER ENUCLEATION OF THE PROSTATE WITH MORCELLATION N/A 07/03/2022   Procedure: HOLEP-LASER ENUCLEATION OF THE PROSTATE WITH MORCELLATION;  Surgeon: Vanna Scotland, MD;  Location: [facility];  Service: Urology;  Laterality: N/A;   LEFT HEART CATH AND CORONARY ANGIOGRAPHY N/A 07/15/2020    Procedure: LEFT HEART CATH AND CORONARY ANGIOGRAPHY;  Surgeon: Lamar Blinks, MD;  Location: [facility];  Service: Cardiovascular;  Laterality: N/A;    Social History   Tobacco Use   Smoking status: Former    Current packs/day: 0.00    Average packs/day: 0.5 packs/day for 5.0 years (2.5 ttl pk-yrs)    Types: Cigarettes    Start date: 10/16/1982    Quit date: 10/17/1987    Years since quitting: 35.8   Smokeless tobacco: Never  Vaping Use   Vaping status: Never Used  Substance Use Topics   Alcohol use: No    Alcohol/week: 0.0 standard drinks of alcohol   Drug use: Never     Medication list has been reviewed and updated.  Current Meds  Medication Sig   atorvastatin (LIPITOR) 40 MG tablet Take 40 mg by mouth daily.   metoprolol succinate (TOPROL-XL) 25 MG 24 hr tablet Take 25 mg by mouth daily.   NON FORMULARY CPAP Nightly.   valsartan-hydrochlorothiazide (DIOVAN-HCT) 320-25 MG tablet Take 1 tablet by mouth daily.   XARELTO 20 MG TABS tablet SMARTSIG:1 Tablet(s) By Mouth Every Evening       08/08/2023    2:43 PM 03/07/2023   10:06 AM 12/01/2022    1:54 PM 09/06/2022   10:41 AM  GAD 7 : Generalized Anxiety Score  Nervous, Anxious, on Edge 0 0 0 0  Control/stop worrying 0 0 0 0  Worry too much - different things 0 0 0 0  Trouble relaxing 0 0 0 0  Restless 0 0 0 0  Easily annoyed or irritable 0 0 0 0  Afraid - awful might happen 0 0 0 0  Total GAD 7 Score 0 0 0 0  Anxiety Difficulty Not difficult at all Not difficult at all Not difficult at all Not difficult at all       08/08/2023    2:42 PM 03/07/2023   10:06 AM 12/01/2022    1:54 PM  Depression screen PHQ 2/9  Decreased Interest 0 0 0  Down, Depressed, Hopeless 0 0 0  PHQ - 2 Score 0 0 0  Altered sleeping 0 0 0  Tired, decreased energy 0 0 1  Change in appetite 0 0 0  Feeling bad or failure about yourself  0 0 0  Trouble concentrating 0 0 0  Moving slowly or fidgety/restless 0 0 0  Suicidal  thoughts 0 0 0  PHQ-9 Score 0 0 1  Difficult doing work/chores Not difficult at all Not difficult at all Not difficult at all    BP Readings from Last 3 Encounters:  08/08/23 112/78  03/28/23 103/69  03/07/23 110/70    Physical Exam Vitals and nursing note reviewed.  Constitutional:      General: He is not in acute distress.    Appearance: He is well-developed.  HENT:     Head: Normocephalic and atraumatic.  Cardiovascular:     Rate and Rhythm: Normal rate and regular rhythm.     Heart sounds: No murmur heard. Pulmonary:     Effort: Pulmonary effort is normal.  No respiratory distress.     Breath sounds: No wheezing or rhonchi.  Musculoskeletal:     Cervical back: Normal range of motion.     Right lower leg: No edema.     Left lower leg: No edema.  Lymphadenopathy:     Cervical: No cervical adenopathy.  Skin:    General: Skin is warm and dry.     Findings: No rash.  Neurological:     General: No focal deficit present.     Mental Status: He is alert and oriented to person, place, and time.  Psychiatric:        Mood and Affect: Mood normal.        Behavior: Behavior normal.     Wt Readings from Last 3 Encounters:  08/08/23 228 lb (103.4 kg)  03/28/23 223 lb 6.4 oz (101.3 kg)  03/07/23 236 lb 12.8 oz (107.4 kg)    BP 112/78   Pulse 61   Ht 6\' 2"  (1.88 m)   Wt 228 lb (103.4 kg)   SpO2 95%   BMI 29.27 kg/m   Assessment and Plan:  Problem List Items Addressed This Visit       Unprioritized   Essential (primary) hypertension (Chronic)    Normal exam with stable BP on valsartan and metoprolol. No concerns or side effects to current medication. No change in regimen; continue low sodium diet.       Paroxysmal atrial fibrillation (HCC) (Chronic)    Not aware of any Afib Remains on Eliquis and metoprolol Consider tapering off of metoprolol if BP seems to be too low      Prediabetes - Primary (Chronic)    Blood sugars stable without hypoglycemic symptoms  or events. Current regimen is diet.  He has lost a few pounds since last visit. Changes made last visit are none. Lab Results  Component Value Date   HGBA1C 6.5 (H) 03/07/2023  A1C today = 6.0       Relevant Orders   POCT glycosylated hemoglobin (Hb A1C) (Completed)   Other Visit Diagnoses     Need for influenza vaccination       Relevant Orders   Flu vaccine trivalent PF, 6mos and older(Flulaval,Afluria,Fluarix,Fluzone) (Completed)       Return for CPX in May.    Reubin Milan, MD [facility] [place]

## 2023-09-07 ENCOUNTER — Ambulatory Visit: Payer: BC Managed Care – PPO | Admitting: Internal Medicine

## 2023-11-22 ENCOUNTER — Ambulatory Visit: Payer: Self-pay | Admitting: *Deleted

## 2023-11-22 NOTE — Telephone Encounter (Signed)
  Chief Complaint: post COVID cough Symptoms: chest congestion with cough Frequency: diagnosed - + COVID home test- 1/28 Pertinent Negatives: Patient denies fever,SOB Disposition: [] ED /[] Urgent Care (no appt availability in office) / [x] Appointment(In office/virtual)/ []  [facility]/ [] Home Care/ [] Refused Recommended Disposition /[] [facility]/ []  Follow-up with PCP Additional Notes: Patient states she has lingering cough from COVID infection- not getting better- appointment scheduled.

## 2023-11-22 NOTE — Telephone Encounter (Signed)
 Summary: Medication Request   Pt is calling in because he had COVID but says he cannot get rid of the cough and wants to know if Dr. Justus can send him in some cough medicine to [facility] on [address].         Reason for Disposition  [1] PERSISTING SYMPTOMS OF COVID-19 AND [2] NO medical visit for COVID-19 in past 2 weeks  Answer Assessment - Initial Assessment Questions 1. COVID-19 ONSET: When did the symptoms of COVID-19 first start?     1/25 2. DIAGNOSIS CONFIRMATION: How were you diagnosed? (e.g., COVID-19 oral or nasal viral test; COVID-19 antibody test; doctor visit)     Home test- +COVID- 1/28 3. MAIN SYMPTOM:  What is your main concern or symptom right now? (e.g., breathing difficulty, cough, fatigue. loss of smell)     cough 4. SYMPTOM ONSET: When did the  cough  start?     Constant throughout COVID 5. BETTER-SAME-WORSE: Are you getting better, staying the same, or getting worse over the last 1 to 2 weeks?     Same- this week as far as the cough 6. RECENT MEDICAL VISIT: Have you been seen by a healthcare provider (doctor, NP, PA) for these persisting COVID-19 symptoms? If Yes, ask: When were you seen? (e.g., date)     no 7. COUGH: Do you have a cough? If Yes, ask: How bad is the cough?       Yes- productive in am- dry throughout the days- yellowish 8. FEVER: Do you have a fever? If Yes, ask: What is your temperature, how was it measured, and when did it start?     no 9. BREATHING DIFFICULTY: Are you having any trouble breathing? If Yes, ask: How bad is your breathing? (e.g., mild, moderate, severe)    - MILD: No SOB at rest, mild SOB with walking, speaks normally in sentences, can lie down, no retractions, pulse < 100.    - MODERATE: SOB at rest, SOB with minimal exertion and prefers to sit, cannot lie down flat, speaks in phrases, mild retractions, audible wheezing, pulse 100-120.    - SEVERE: Very SOB at rest, speaks in single words,  struggling to breathe, sitting hunched forward, retractions, pulse > 120.       No- patient has been moving around 10. OTHER SYMPTOMS: Do you have any other symptoms?  (e.g., fatigue, headache, muscle pain, weakness)       no 11. HIGH RISK DISEASE: Do you have any chronic medical problems? (e.g., asthma, heart or lung disease, weak immune system, obesity, etc.)       Hypertension, Afib hx  Protocols used: Coronavirus (COVID-19) Persisting Symptoms Follow-up Call-[facility]

## 2023-11-23 ENCOUNTER — Encounter: Payer: Self-pay | Admitting: Family Medicine

## 2023-11-23 ENCOUNTER — Ambulatory Visit: Payer: 59 | Admitting: Family Medicine

## 2023-11-23 VITALS — BP 120/80 | HR 76 | Ht 74.0 in | Wt 239.0 lb

## 2023-11-23 DIAGNOSIS — U099 Post covid-19 condition, unspecified: Secondary | ICD-10-CM | POA: Diagnosis not present

## 2023-11-23 DIAGNOSIS — R053 Chronic cough: Secondary | ICD-10-CM

## 2023-11-23 MED ORDER — PROMETHAZINE-DM 6.25-15 MG/5ML PO SYRP
5.0000 mL | ORAL_SOLUTION | Freq: Four times a day (QID) | ORAL | 0 refills | Status: DC | PRN
Start: 2023-11-23 — End: 2023-11-30

## 2023-11-23 NOTE — Assessment & Plan Note (Signed)
 History of Present Illness John Hanna is a 63 year old male who presents with a persistent nighttime cough.  He has a persistent nighttime cough following a recent COVID-19 infection diagnosed approximately two weeks ago. This is his second COVID-19 infection. The cough is notably worse at night, impacting his sleep, although he managed to sleep well the previous night without medication. He experiences some phlegm production in the morning, which has recently changed from clear to yellow.  He has a partial loss of taste, which is beginning to return. During his first COVID-19 infection, he lost his sense of taste for about a week. No significant pressure around the forehead, nasal bridge, or chest, although he occasionally experiences some pressure in the sinuses areas. No ear problems or sore throat, and he feels he is breathing adequately without any chest congestion.  He has been using a CPAP machine  to congestion issues. He has been taking over-the-counter flu and cold medications but did not take any the previous night to assess his sleep quality without them. He previously used Flonase  but discontinued it due to epistaxis, we discussed the alcohol-based propellant in the spray. He is currently on Xarelto.  Physical Exam VITALS: SaO2- 97% (appears baseline per EMR review) HEENT: Sinuses minimally tender upon palpation.  Tympanic membranes, canals, and oropharynx benign. NECK: No lymphadenopathy palpated. CHEST: Lungs clear to auscultation bilaterally without wheezes, rales, rhonchi, air movement adequate. CARDIOVASCULAR: Heart sounds normal upon auscultation.  Assessment and Plan Post COVID-19 cough Second episode of infection with predominant symptoms of nocturnal cough and change in sputum color. No signs of secondary bacterial infection on examination. Taste is returning. -Prescribe for mothering-dextromethorphan (2.5-88ml as needed, especially at night). -Advise use of Flonase   since he missed or equivalent nonalcohol-based steroid nasal spray nightly while symptomatic. -Advise use of saline nasal spray as an optional additional measure. -Monitor symptoms and advise patient to contact clinic if symptoms worsen or do not improve.  Follow-up Monitor recovery from COVID-19, ensure steady improvement. Contact clinic if symptoms worsen or do not improve.

## 2023-11-23 NOTE — Patient Instructions (Signed)
 Patient Instructions for Post COVID-19 Cough  1. Medication:    - Take Phenergan -dextromethorphan cough syrup (2.5-5 ml) as needed, especially at night.  2. Nasal Care:    - Use Flonase  Sensimist or a nonalcohol-based steroid nasal spray nightly.    - Optionally, use a saline nasal spray.  3. Monitoring:    - Contact the clinic if symptoms worsen or do not improve.

## 2023-11-23 NOTE — Progress Notes (Signed)
 Primary Care / Sports Medicine Office Visit  Patient Information:  Patient ID: John Hanna, male DOB: 02-26-1961 Age: 63 y.o. MRN: [id number]   John Hanna is a pleasant 63 y.o. male presenting with the following:  Chief Complaint  Patient presents with   Cough    Patient tested positive for COVID on 11/13/23. He still has a cough and would like something for this. The cough is worse at night .    Vitals:   11/23/23 0916  BP: 120/80  Pulse: 76  SpO2: 97%   Vitals:   11/23/23 0916  Weight: 239 lb (108.4 kg)  Height: 6' 2 (1.88 m)   Body mass index is 30.69 kg/m.  No results found.   Independent interpretation of notes and tests performed by another provider:   None  Procedures performed:   None  Pertinent History, Exam, Impression, and Recommendations:   Problem List Items Addressed This Visit     Post-COVID chronic cough - Primary   History of Present Illness John Hanna is a 63 year old male who presents with a persistent nighttime cough.  He has a persistent nighttime cough following a recent COVID-19 infection diagnosed approximately two weeks ago. This is his second COVID-19 infection. The cough is notably worse at night, impacting his sleep, although he managed to sleep well the previous night without medication. He experiences some phlegm production in the morning, which has recently changed from clear to yellow.  He has a partial loss of taste, which is beginning to return. During his first COVID-19 infection, he lost his sense of taste for about a week. No significant pressure around the forehead, nasal bridge, or chest, although he occasionally experiences some pressure in the sinuses areas. No ear problems or sore throat, and he feels he is breathing adequately without any chest congestion.  He has been using a CPAP machine  to congestion issues. He has been taking over-the-counter flu and cold medications but did not take any the  previous night to assess his sleep quality without them. He previously used Flonase  but discontinued it due to epistaxis, we discussed the alcohol-based propellant in the spray. He is currently on Xarelto.  Physical Exam VITALS: SaO2- 97% (appears baseline per EMR review) HEENT: Sinuses minimally tender upon palpation.  Tympanic membranes, canals, and oropharynx benign. NECK: No lymphadenopathy palpated. CHEST: Lungs clear to auscultation bilaterally without wheezes, rales, rhonchi, air movement adequate. CARDIOVASCULAR: Heart sounds normal upon auscultation.  Assessment and Plan Post COVID-19 cough Second episode of infection with predominant symptoms of nocturnal cough and change in sputum color. No signs of secondary bacterial infection on examination. Taste is returning. -Prescribe for mothering-dextromethorphan (2.5-77ml as needed, especially at night). -Advise use of Flonase  since he missed or equivalent nonalcohol-based steroid nasal spray nightly while symptomatic. -Advise use of saline nasal spray as an optional additional measure. -Monitor symptoms and advise patient to contact clinic if symptoms worsen or do not improve.  Follow-up Monitor recovery from COVID-19, ensure steady improvement. Contact clinic if symptoms worsen or do not improve.      Relevant Medications   promethazine -dextromethorphan (PROMETHAZINE -DM) 6.25-15 MG/5ML syrup     Orders & Medications Medications:  Meds ordered this encounter  Medications   promethazine -dextromethorphan (PROMETHAZINE -DM) 6.25-15 MG/5ML syrup    Sig: Take 5 mLs by mouth 4 (four) times daily as needed for cough.    Dispense:  118 mL    Refill:  0   No orders of  the defined types were placed in this encounter.    No follow-ups on file.     Selinda JINNY Ku, MD, [facility]   Primary Care Sports Medicine Primary Care and Sports Medicine at [facility] [place]

## 2023-11-29 ENCOUNTER — Ambulatory Visit: Payer: Self-pay

## 2023-11-29 NOTE — Telephone Encounter (Signed)
  Chief Complaint: cough Symptoms: cough worse, dry, worse at night Frequency: ongoing several weeks since had COVID but gotten worse last 2 days  Pertinent Negatives: Patient denies SOB or congestion  Disposition: [] ED /[] Urgent Care (no appt availability in office) / [] Appointment(In office/virtual)/ []  [place] [facility]/ [] Home Care/ [] Refused Recommended Disposition /[] [place] [facility]/ [x]  Follow-up with PCP Additional Notes: pt states he has been taking the Promethazine DM cough syrup and was helping with cough but gotten to where not effective. Had rough night last night d/t cough. Pt asking for something different to be sent in. Denies congestion or SOB. Advised pt I would send message to provider since he had OV 11/23/23 with Dr. Ashley Royalty. Pt would like something sent to [facility].   Summary: cough   Pt called in says still has , cough even with med. He had appt last week, so wants to know can Dr Judithann Graves send him something else to help with cough         Reason for Disposition  [1] Continuous (nonstop) coughing interferes with work or school AND [2] no improvement using cough treatment per Care Advice  Answer Assessment - Initial Assessment Questions 1. ONSET: "When did the cough begin?"      Several weeks post COVID  2. SEVERITY: "How bad is the cough today?"      Last 2 days worse Worse last night  3. SPUTUM: "Describe the color of your sputum" (none, dry cough; clear, white, yellow, green)     Dry  5. DIFFICULTY BREATHING: "Are you having difficulty breathing?" If Yes, ask: "How bad is it?" (e.g., mild, moderate, severe)    - MILD: No SOB at rest, mild SOB with walking, speaks normally in sentences, can lie down, no retractions, pulse < 100.    - MODERATE: SOB at rest, SOB with minimal exertion and prefers to sit, cannot lie down flat, speaks in phrases, mild retractions, audible wheezing, pulse 100-120.    - SEVERE: Very SOB at rest, speaks in single words, struggling  to breathe, sitting hunched forward, retractions, pulse > 120      no 8. LUNG HISTORY: "Do you have any history of lung disease?"  (e.g., pulmonary embolus, asthma, emphysema)     no 10. OTHER SYMPTOMS: "Do you have any other symptoms?" (e.g., runny nose, wheezing, chest pain)       Cough worse at nights  Protocols used: Cough - Acute Productive-A-AH

## 2023-11-29 NOTE — Telephone Encounter (Signed)
Please review.  KP

## 2023-11-30 ENCOUNTER — Telehealth: Payer: Self-pay | Admitting: Internal Medicine

## 2023-11-30 ENCOUNTER — Other Ambulatory Visit: Payer: Self-pay | Admitting: Internal Medicine

## 2023-11-30 DIAGNOSIS — U099 Post covid-19 condition, unspecified: Secondary | ICD-10-CM

## 2023-11-30 MED ORDER — HYDROCODONE BIT-HOMATROP MBR 5-1.5 MG/5ML PO SOLN
5.0000 mL | Freq: Four times a day (QID) | ORAL | 0 refills | Status: DC | PRN
Start: 2023-11-30 — End: 2023-11-30

## 2023-11-30 MED ORDER — HYDROCODONE BIT-HOMATROP MBR 5-1.5 MG/5ML PO SOLN
5.0000 mL | Freq: Four times a day (QID) | ORAL | 0 refills | Status: AC | PRN
Start: 2023-11-30 — End: 2023-12-10

## 2023-11-30 NOTE — Telephone Encounter (Signed)
Medication Refill -  Most Recent Primary Care Visit:  Provider: Reubin Milan  Department: [facility]  Visit Type: OFFICE VISIT  Date: 08/08/2023  Medication: HYDROcodone bit-homatropine (HYCODAN) 5-1.5 MG/5ML syrup  Pharmacy at [facility] doesn't have this , so asking for this to be sent to [facility] instead  Has the patient contacted their pharmacy? yes Pharmacy called directly in  Is this the correct pharmacy for this prescription? yes  This is the patient's preferred pharmacy:   [facility] - [place], [place] - [address] [place] [address] Phone: [phone] Fax: [phone]   Has the prescription been filled recently? No    Is the patient out of the medication? yes  Has the patient been seen for an appointment in the last year OR does the patient have an upcoming appointment? yes  Can we respond through MyChart? yes  Agent: Please be advised that Rx refills may take up to 3 business days. We ask that you follow-up with your pharmacy.

## 2023-11-30 NOTE — Telephone Encounter (Signed)
Copied from CRM [id number]. Topic: Clinical - Medication Refill >> Nov 30, 2023  4:15 PM Carla L wrote: Reason for CRM: Pt calling to follow up on rx request to switch to new pharmacy for HYDROcodone bit-homatropine (HYCODAN) 5-1.5 MG/5ML syrup due to it being out of stock. Pt hoping it will go through before the weekend.

## 2023-11-30 NOTE — Telephone Encounter (Signed)
Please review  Thank you

## 2023-11-30 NOTE — Telephone Encounter (Signed)
Pt called. Pt aware.  KP

## 2023-11-30 NOTE — Telephone Encounter (Signed)
Please review.  KP

## 2023-11-30 NOTE — Progress Notes (Unsigned)
Date:  11/30/2023   Name:  John Hanna   DOB:  1961-02-15   MRN:  [id number]   Chief Complaint: No chief complaint on file.  HPI  Review of Systems   Lab Results  Component Value Date   NA 140 03/07/2023   K 4.2 03/07/2023   CO2 23 03/07/2023   GLUCOSE 102 (H) 03/07/2023   BUN 17 03/07/2023   CREATININE 1.11 03/07/2023   CALCIUM 9.6 03/07/2023   EGFR 76 03/07/2023   GFRNONAA >60 05/21/2022   Lab Results  Component Value Date   CHOL 100 03/07/2023   HDL 37 (L) 03/07/2023   LDLCALC 41 03/07/2023   TRIG 124 03/07/2023   CHOLHDL 2.7 03/07/2023   Lab Results  Component Value Date   TSH 1.655 07/13/2020   Lab Results  Component Value Date   HGBA1C 6.0 (A) 08/08/2023   Lab Results  Component Value Date   WBC 9.3 03/07/2023   HGB 13.5 03/07/2023   HCT 41.1 03/07/2023   MCV 92 03/07/2023   PLT 234 03/07/2023   Lab Results  Component Value Date   ALT 40 03/07/2023   AST 24 03/07/2023   ALKPHOS 55 03/07/2023   BILITOT 0.8 03/07/2023   No results found for: "25OHVITD2", "25OHVITD3", "VD25OH"   Patient Active Problem List   Diagnosis Date Noted   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   Adenomatous polyp of colon 03/28/2023   Mixed hyperlipidemia 03/06/2022   Prediabetes 03/07/2021   Acquired thrombophilia (HCC) 03/03/2021   S/P ablation of atrial fibrillation 12/01/2020   Coronary artery disease involving native coronary artery of native heart without angina pectoris 05/04/2020   Paroxysmal atrial fibrillation (HCC) 02/26/2020   OSA on CPAP 06/20/2019   BPH associated with nocturia 02/21/2018   Essential (primary) hypertension 07/26/2015   History of colon polyps 07/26/2015   H/O malignant neoplasm of skin 07/26/2015   Plantar fasciitis 07/26/2015    Allergies  Allergen Reactions   Cyclobenzaprine Other (See Comments)    Acute urinary retention    Past Surgical History:  Procedure Laterality Date   CARDIAC ELECTROPHYSIOLOGY STUDY AND  ABLATION  07/20/2020   COLONOSCOPY  01/2011   repeat 3 yrs - @ [facility]   COLONOSCOPY WITH PROPOFOL N/A 03/28/2023   Procedure: COLONOSCOPY WITH PROPOFOL;  Surgeon: Wyline Mood, MD;  Location: [facility] ENDOSCOPY;  Service: Gastroenterology;  Laterality: N/A;   HERNIA REPAIR  1992   HOLEP-LASER ENUCLEATION OF THE PROSTATE WITH MORCELLATION N/A 07/03/2022   Procedure: HOLEP-LASER ENUCLEATION OF THE PROSTATE WITH MORCELLATION;  Surgeon: Vanna Scotland, MD;  Location: [facility] ORS;  Service: Urology;  Laterality: N/A;   LEFT HEART CATH AND CORONARY ANGIOGRAPHY N/A 07/15/2020   Procedure: LEFT HEART CATH AND CORONARY ANGIOGRAPHY;  Surgeon: Lamar Blinks, MD;  Location: [facility] INVASIVE CV LAB;  Service: Cardiovascular;  Laterality: N/A;    Social History   Tobacco Use   Smoking status: Former    Current packs/day: 0.00    Average packs/day: 0.5 packs/day for 5.0 years (2.5 ttl pk-yrs)    Types: Cigarettes    Start date: 10/16/1982    Quit date: 10/17/1987    Years since quitting: 36.1   Smokeless tobacco: Never  Vaping Use   Vaping status: Never Used  Substance Use Topics   Alcohol use: No    Alcohol/week: 0.0 standard drinks of alcohol   Drug use: Never     Medication list has been reviewed and updated.  No  outpatient medications have been marked as taking for the 11/30/23 encounter (Orders Only) with Reubin Milan, MD.       11/23/2023    9:23 AM 08/08/2023    2:43 PM 03/07/2023   10:06 AM 12/01/2022    1:54 PM  GAD 7 : Generalized Anxiety Score  Nervous, Anxious, on Edge 1 0 0 0  Control/stop worrying 0 0 0 0  Worry too much - different things 0 0 0 0  Trouble relaxing 0 0 0 0  Restless 0 0 0 0  Easily annoyed or irritable 0 0 0 0  Afraid - awful might happen 0 0 0 0  Total GAD 7 Score 1 0 0 0  Anxiety Difficulty Not difficult at all Not difficult at all Not difficult at all Not difficult at all       11/23/2023    9:23 AM 08/08/2023    2:42 PM 03/07/2023    10:06 AM  Depression screen PHQ 2/9  Decreased Interest 1 0 0  Down, Depressed, Hopeless 0 0 0  PHQ - 2 Score 1 0 0  Altered sleeping 1 0 0  Tired, decreased energy 1 0 0  Change in appetite 0 0 0  Feeling bad or failure about yourself  0 0 0  Trouble concentrating 0 0 0  Moving slowly or fidgety/restless 1 0 0  Suicidal thoughts 0 0 0  PHQ-9 Score 4 0 0  Difficult doing work/chores Not difficult at all Not difficult at all Not difficult at all    BP Readings from Last 3 Encounters:  11/23/23 120/80  08/08/23 112/78  03/28/23 103/69    Physical Exam  Wt Readings from Last 3 Encounters:  11/23/23 239 lb (108.4 kg)  08/08/23 228 lb (103.4 kg)  03/28/23 223 lb 6.4 oz (101.3 kg)    There were no vitals taken for this visit.  Assessment and Plan:  Problem List Items Addressed This Visit   None   No follow-ups on file.    Reubin Milan, MD [facility] [place]

## 2024-03-06 ENCOUNTER — Other Ambulatory Visit: Payer: Self-pay | Admitting: Internal Medicine

## 2024-03-06 DIAGNOSIS — I10 Essential (primary) hypertension: Secondary | ICD-10-CM

## 2024-03-07 ENCOUNTER — Encounter: Payer: Self-pay | Admitting: Internal Medicine

## 2024-03-07 NOTE — Telephone Encounter (Signed)
 Requested medications are due for refill today.  yes  Requested medications are on the active medications list.  yes  Last refill. 03/07/2023 #90 3 rf  Future visit scheduled.   yes  Notes to clinic.  Labs are expired.    Requested Prescriptions  Pending Prescriptions Disp Refills   valsartan -hydrochlorothiazide  (DIOVAN -HCT) 320-25 MG tablet [Pharmacy Med Name: Valsartan -hydroCHLOROthiazide  320-25 MG Oral Tablet] 90 tablet 0    Sig: Take 1 tablet by mouth once daily     Cardiovascular: ARB + Diuretic Combos Failed - 03/07/2024  5:04 PM      Failed - K in normal range and within 180 days    Potassium  Date Value Ref Range Status  03/07/2023 4.2 3.5 - 5.2 mmol/L Final         Failed - Na in normal range and within 180 days    Sodium  Date Value Ref Range Status  03/07/2023 140 134 - 144 mmol/L Final         Failed - Cr in normal range and within 180 days    Creatinine, Ser  Date Value Ref Range Status  03/07/2023 1.11 0.76 - 1.27 mg/dL Final         Failed - eGFR is 10 or above and within 180 days    GFR calc Af Amer  Date Value Ref Range Status  07/15/2020 >60 >60 mL/min Final   GFR, Estimated  Date Value Ref Range Status  05/21/2022 >60 >60 mL/min Final    Comment:    (NOTE) Calculated using the CKD-EPI Creatinine Equation (2021)    eGFR  Date Value Ref Range Status  03/07/2023 76 >59 mL/min/1.73 Final         Failed - Valid encounter within last 6 months    Recent Outpatient Visits           3 months ago Post-COVID chronic cough   [facility] Ledger, Dessie Flow, MD       Future Appointments             In 1 week Sheron Dixons, MD [facility] at [facility], [facility]            Passed - Patient is not pregnant      Passed - Last BP in normal range    BP Readings from Last 1 Encounters:  11/23/23 120/80

## 2024-03-11 ENCOUNTER — Encounter: Payer: BC Managed Care – PPO | Admitting: Internal Medicine

## 2024-03-11 NOTE — Telephone Encounter (Signed)
 Medication refill

## 2024-03-18 ENCOUNTER — Encounter: Payer: Self-pay | Admitting: Internal Medicine

## 2024-03-18 ENCOUNTER — Ambulatory Visit (INDEPENDENT_AMBULATORY_CARE_PROVIDER_SITE_OTHER): Admitting: Internal Medicine

## 2024-03-18 VITALS — BP 122/78 | HR 58 | Ht 74.0 in | Wt 239.0 lb

## 2024-03-18 DIAGNOSIS — E782 Mixed hyperlipidemia: Secondary | ICD-10-CM

## 2024-03-18 DIAGNOSIS — I10 Essential (primary) hypertension: Secondary | ICD-10-CM

## 2024-03-18 DIAGNOSIS — R7303 Prediabetes: Secondary | ICD-10-CM

## 2024-03-18 DIAGNOSIS — I480 Paroxysmal atrial fibrillation: Secondary | ICD-10-CM

## 2024-03-18 DIAGNOSIS — Z125 Encounter for screening for malignant neoplasm of prostate: Secondary | ICD-10-CM

## 2024-03-18 DIAGNOSIS — G4733 Obstructive sleep apnea (adult) (pediatric): Secondary | ICD-10-CM

## 2024-03-18 DIAGNOSIS — Z0000 Encounter for general adult medical examination without abnormal findings: Secondary | ICD-10-CM | POA: Diagnosis not present

## 2024-03-18 DIAGNOSIS — N401 Enlarged prostate with lower urinary tract symptoms: Secondary | ICD-10-CM

## 2024-03-18 DIAGNOSIS — R351 Nocturia: Secondary | ICD-10-CM

## 2024-03-18 NOTE — Assessment & Plan Note (Signed)
 On CPAP nightly with good response. Ordered by Cardiology - he may need a new machine

## 2024-03-18 NOTE — Assessment & Plan Note (Signed)
 LDL is  Lab Results  Component Value Date   LDLCALC 41 03/07/2023   Current regimen is atorvastatin.  No medication side effects noted. Goal LDL is <55.

## 2024-03-18 NOTE — Assessment & Plan Note (Addendum)
 No recurrent episodes since ablation. Continues on Eliquis indefinitely. No bleeding issues.  He sees Cardiology regularly.

## 2024-03-18 NOTE — Progress Notes (Signed)
 Date:  03/18/2024   Name:  John Hanna   DOB:  1961-04-11   MRN:  [id number]   Chief Complaint: Annual Exam John Hanna is a 63 y.o. male who presents today for his Complete Annual Exam. He feels well. He reports exercising walks a lot at work, goes yard work at home. He reports he is sleeping well.   Health Maintenance  Topic Date Due   Pneumococcal Vaccination (1 of 2 - PCV) Never done   Zoster (Shingles) Vaccine (1 of 2) 06/18/2024*   COVID-19 Vaccine (3 - Pfizer risk series) 04/03/2025*   Flu Shot  05/16/2024   Colon Cancer Screening  03/27/2028   DTaP/Tdap/Td vaccine (3 - Td or Tdap) 03/06/2033   Hepatitis C Screening  Completed   HIV Screening  Completed   HPV Vaccine  Aged Out   Meningitis B Vaccine  Aged Out  *Topic was postponed. The date shown is not the original due date.    Lab Results  Component Value Date   PSA1 0.3 02/13/2023   PSA1 1.4 03/06/2022   PSA1 3.6 08/31/2020   PSA 3.0 02/17/2015    Hypertension This is a chronic problem. The problem is controlled. Pertinent negatives include no chest pain, headaches, palpitations or shortness of breath. Past treatments include angiotensin blockers, diuretics and beta blockers. The current treatment provides significant improvement. Hypertensive end-organ damage includes CAD/MI (Paroxysmal Afib).  Hyperlipidemia This is a chronic problem. The problem is controlled. Pertinent negatives include no chest pain or shortness of breath. Current antihyperlipidemic treatment includes statins.  OSA - on CPAP nightly with benefit.     Review of Systems  Constitutional:  Negative for fatigue and unexpected weight change.  HENT:  Negative for nosebleeds.   Eyes:  Negative for visual disturbance.  Respiratory:  Negative for cough, chest tightness, shortness of breath and wheezing.   Cardiovascular:  Negative for chest pain, palpitations and leg swelling.  Gastrointestinal:  Negative for abdominal pain,  constipation and diarrhea.  Genitourinary:  Negative for frequency, hematuria and urgency.  Musculoskeletal:  Negative for arthralgias and joint swelling.  Skin:  Negative for color change and rash.  Neurological:  Negative for dizziness, weakness, light-headedness and headaches.  Psychiatric/Behavioral:  Negative for dysphoric mood and sleep disturbance. The patient is not nervous/anxious.      Lab Results  Component Value Date   NA 140 03/07/2023   K 4.2 03/07/2023   CO2 23 03/07/2023   GLUCOSE 102 (H) 03/07/2023   BUN 17 03/07/2023   CREATININE 1.11 03/07/2023   CALCIUM 9.6 03/07/2023   EGFR 76 03/07/2023   GFRNONAA >60 05/21/2022   Lab Results  Component Value Date   CHOL 100 03/07/2023   HDL 37 (L) 03/07/2023   LDLCALC 41 03/07/2023   TRIG 124 03/07/2023   CHOLHDL 2.7 03/07/2023   Lab Results  Component Value Date   TSH 1.655 07/13/2020   Lab Results  Component Value Date   HGBA1C 6.0 (A) 08/08/2023   Lab Results  Component Value Date   WBC 9.3 03/07/2023   HGB 13.5 03/07/2023   HCT 41.1 03/07/2023   MCV 92 03/07/2023   PLT 234 03/07/2023   Lab Results  Component Value Date   ALT 40 03/07/2023   AST 24 03/07/2023   ALKPHOS 55 03/07/2023   BILITOT 0.8 03/07/2023   No results found for: "25OHVITD2", "25OHVITD3", "VD25OH"   Patient Active Problem List   Diagnosis Date Noted   Adenomatous polyp  of colon 03/28/2023   Mixed hyperlipidemia 03/06/2022   Prediabetes 03/07/2021   Acquired thrombophilia (HCC) 03/03/2021   S/P ablation of atrial fibrillation 12/01/2020   Coronary artery disease involving native coronary artery of native heart without angina pectoris 05/04/2020   Paroxysmal atrial fibrillation (HCC) 02/26/2020   OSA on CPAP 06/20/2019   BPH associated with nocturia 02/21/2018   Essential (primary) hypertension 07/26/2015   History of colon polyps 07/26/2015   H/O malignant neoplasm of skin 07/26/2015   Plantar fasciitis 07/26/2015     Allergies  Allergen Reactions   Cyclobenzaprine  Other (See Comments)    Acute urinary retention    Past Surgical History:  Procedure Laterality Date   CARDIAC ELECTROPHYSIOLOGY STUDY AND ABLATION  07/20/2020   COLONOSCOPY  01/2011   repeat 3 yrs - @ [facility]   COLONOSCOPY WITH PROPOFOL  N/A 03/28/2023   Procedure: COLONOSCOPY WITH PROPOFOL ;  Surgeon: Luke Salaam, MD;  Location[facility] ENDOSCOPY;  Service: Gastroenterology;  Laterality: N/A;   HERNIA REPAIR  1992   HOLEP-LASER ENUCLEATION OF THE PROSTATE WITH MORCELLATION N/A 07/03/2022   Procedure: HOLEP-LASER ENUCLEATION OF THE PROSTATE WITH MORCELLATION;  Surgeon: Dustin Gimenez, MD;  Location: [facility] ORS;  Service: Urology;  Laterality: N/A;   LEFT HEART CATH AND CORONARY ANGIOGRAPHY N/A 07/15/2020   Procedure: LEFT HEART CATH AND CORONARY ANGIOGRAPHY;  Surgeon: Michelle Aid, MD;  Location: [facility] INVASIVE CV LAB;  Service: Cardiovascular;  Laterality: N/A;    Social History   Tobacco Use   Smoking status: Former    Current packs/day: 0.00    Average packs/day: 0.5 packs/day for 5.0 years (2.5 ttl pk-yrs)    Types: Cigarettes    Start date: 10/16/1982    Quit date: 10/17/1987    Years since quitting: 36.4   Smokeless tobacco: Never  Vaping Use   Vaping status: Never Used  Substance Use Topics   Alcohol use: No    Alcohol/week: 0.0 standard drinks of alcohol   Drug use: Never     Medication list has been reviewed and updated.  Current Meds  Medication Sig   atorvastatin (LIPITOR) 40 MG tablet Take 40 mg by mouth daily.   metoprolol  succinate (TOPROL -XL) 25 MG 24 hr tablet Take 25 mg by mouth daily.   NON FORMULARY CPAP Nightly.   valsartan -hydrochlorothiazide  (DIOVAN -HCT) 320-25 MG tablet Take 1 tablet by mouth once daily   XARELTO 20 MG TABS tablet SMARTSIG:1 Tablet(s) By Mouth Every Evening       03/18/2024    9:38 AM 11/23/2023    9:23 AM 08/08/2023    2:43 PM 03/07/2023   10:06 AM  GAD 7 :  Generalized Anxiety Score  Nervous, Anxious, on Edge 0 1 0 0  Control/stop worrying 0 0 0 0  Worry too much - different things 0 0 0 0  Trouble relaxing 0 0 0 0  Restless 0 0 0 0  Easily annoyed or irritable 0 0 0 0  Afraid - awful might happen 0 0 0 0  Total GAD 7 Score 0 1 0 0  Anxiety Difficulty Not difficult at all Not difficult at all Not difficult at all Not difficult at all       03/18/2024    9:35 AM 11/23/2023    9:23 AM 08/08/2023    2:42 PM  Depression screen PHQ 2/9  Decreased Interest 0 1 0  Down, Depressed, Hopeless 0 0 0  PHQ - 2 Score 0 1 0  Altered sleeping  0 1 0  Tired, decreased energy 0 1 0  Change in appetite 0 0 0  Feeling bad or failure about yourself  0 0 0  Trouble concentrating 0 0 0  Moving slowly or fidgety/restless 0 1 0  Suicidal thoughts 0 0 0  PHQ-9 Score 0 4 0  Difficult doing work/chores Not difficult at all Not difficult at all Not difficult at all    BP Readings from Last 3 Encounters:  03/18/24 122/78  11/23/23 120/80  08/08/23 112/78    Physical Exam Vitals and nursing note reviewed.  Constitutional:      Appearance: Normal appearance. He is well-developed.  HENT:     Head: Normocephalic.     Right Ear: Tympanic membrane, ear canal and external ear normal.     Left Ear: Tympanic membrane, ear canal and external ear normal.     Nose: Nose normal.  Eyes:     Conjunctiva/sclera: Conjunctivae normal.     Pupils: Pupils are equal, round, and reactive to light.  Neck:     Thyroid: No thyromegaly.     Vascular: No carotid bruit.  Cardiovascular:     Rate and Rhythm: Normal rate and regular rhythm.     Heart sounds: Normal heart sounds.  Pulmonary:     Effort: Pulmonary effort is normal.     Breath sounds: Normal breath sounds. No wheezing.  Chest:  Breasts:    Right: No mass.     Left: No mass.  Abdominal:     General: Bowel sounds are normal.     Palpations: Abdomen is soft.     Tenderness: There is no abdominal tenderness.   Musculoskeletal:        General: Normal range of motion.     Cervical back: Normal range of motion and neck supple.     Right lower leg: No edema.     Left lower leg: No edema.  Lymphadenopathy:     Cervical: No cervical adenopathy.  Skin:    General: Skin is warm and dry.     Capillary Refill: Capillary refill takes less than 2 seconds.  Neurological:     General: No focal deficit present.     Mental Status: He is alert and oriented to person, place, and time.     Deep Tendon Reflexes: Reflexes are normal and symmetric.  Psychiatric:        Attention and Perception: Attention normal.        Mood and Affect: Mood normal.        Thought Content: Thought content normal.     Wt Readings from Last 3 Encounters:  03/18/24 239 lb (108.4 kg)  11/23/23 239 lb (108.4 kg)  08/08/23 228 lb (103.4 kg)    BP 122/78   Pulse (!) 58   Ht 6\' 2"  (1.88 m)   Wt 239 lb (108.4 kg)   SpO2 99%   BMI 30.69 kg/m   Assessment and Plan:  Problem List Items Addressed This Visit       Unprioritized   Essential (primary) hypertension (Chronic)   Blood pressure is well controlled.  Current medications Valsartan , hydrochlorothiazide  and metoprolol . Will continue same regimen along with efforts to limit dietary sodium.       Relevant Orders   CBC with Differential/Platelet   Comprehensive metabolic panel with GFR   TSH   Urinalysis, Routine w reflex microscopic   BPH associated with nocturia (Chronic)   Doing well on no medications. Will check PSA  OSA on CPAP (Chronic)   On CPAP nightly with good response. Ordered by Cardiology - he may need a new machine      Paroxysmal atrial fibrillation (HCC) (Chronic)   No recurrent episodes since ablation. Continues on Eliquis indefinitely. No bleeding issues.  He sees Cardiology regularly.      Prediabetes (Chronic)   Weight is up a few pounds. Will encourage more physical activity and diet changes. Check A1C      Relevant  Orders   Hemoglobin A1c   Mixed hyperlipidemia (Chronic)   LDL is  Lab Results  Component Value Date   LDLCALC 41 03/07/2023   Current regimen is atorvastatin.  No medication side effects noted. Goal LDL is <55.       Relevant Orders   Lipid panel   Other Visit Diagnoses       Annual physical exam    -  Primary   He declines Prevnar20 up to date on colonoscopy   Relevant Orders   CBC with Differential/Platelet   Comprehensive metabolic panel with GFR   Hemoglobin A1c   Lipid panel   PSA   TSH   Urinalysis, Routine w reflex microscopic     Prostate cancer screening       Relevant Orders   PSA       Return in about 6 months (around 09/17/2024) for HTN.    Sheron Dixons, MD [facility] [place]

## 2024-03-18 NOTE — Assessment & Plan Note (Signed)
 Doing well on no medications. Will check PSA

## 2024-03-18 NOTE — Assessment & Plan Note (Signed)
 Weight is up a few pounds. Will encourage more physical activity and diet changes. Check A1C

## 2024-03-18 NOTE — Assessment & Plan Note (Signed)
 Blood pressure is well controlled.  Current medications Valsartan , hydrochlorothiazide  and metoprolol . Will continue same regimen along with efforts to limit dietary sodium.

## 2024-03-19 ENCOUNTER — Ambulatory Visit: Payer: Self-pay | Admitting: Internal Medicine

## 2024-03-19 LAB — COMPREHENSIVE METABOLIC PANEL WITH GFR
ALT: 36 IU/L (ref 0–44)
AST: 22 IU/L (ref 0–40)
Albumin: 4.5 g/dL (ref 3.8–4.9)
Alkaline Phosphatase: 59 IU/L (ref 44–121)
BUN/Creatinine Ratio: 12 (ref 10–24)
BUN: 13 mg/dL (ref 8–27)
Bilirubin Total: 0.8 mg/dL (ref 0.0–1.2)
CO2: 23 mmol/L (ref 20–29)
Calcium: 9.7 mg/dL (ref 8.6–10.2)
Chloride: 99 mmol/L (ref 96–106)
Creatinine, Ser: 1.13 mg/dL (ref 0.76–1.27)
Globulin, Total: 2.8 g/dL (ref 1.5–4.5)
Glucose: 98 mg/dL (ref 70–99)
Potassium: 4.2 mmol/L (ref 3.5–5.2)
Sodium: 141 mmol/L (ref 134–144)
Total Protein: 7.3 g/dL (ref 6.0–8.5)
eGFR: 74 mL/min/{1.73_m2} (ref >59)

## 2024-03-19 LAB — URINALYSIS, ROUTINE W REFLEX MICROSCOPIC
Bilirubin, UA: NEGATIVE
Glucose, UA: NEGATIVE
Ketones, UA: NEGATIVE
Leukocytes,UA: NEGATIVE
Nitrite, UA: NEGATIVE
Protein,UA: NEGATIVE
RBC, UA: NEGATIVE
Specific Gravity, UA: 1.019 (ref 1.005–1.030)
Urobilinogen, Ur: 0.2 mg/dL (ref 0.2–1.0)
pH, UA: 5.5 (ref 5.0–7.5)

## 2024-03-19 LAB — CBC WITH DIFFERENTIAL/PLATELET
Basophils Absolute: 0 10*3/uL (ref 0.0–0.2)
Basos: 0 %
EOS (ABSOLUTE): 0.2 10*3/uL (ref 0.0–0.4)
Eos: 2 %
Hematocrit: 47 % (ref 37.5–51.0)
Hemoglobin: 14.9 g/dL (ref 13.0–17.7)
Immature Grans (Abs): 0 10*3/uL (ref 0.0–0.1)
Immature Granulocytes: 0 %
Lymphocytes Absolute: 2.1 10*3/uL (ref 0.7–3.1)
Lymphs: 29 %
MCH: 29.6 pg (ref 26.6–33.0)
MCHC: 31.7 g/dL (ref 31.5–35.7)
MCV: 93 fL (ref 79–97)
Monocytes Absolute: 0.7 10*3/uL (ref 0.1–0.9)
Monocytes: 9 %
Neutrophils Absolute: 4.3 10*3/uL (ref 1.4–7.0)
Neutrophils: 60 %
Platelets: 197 10*3/uL (ref 150–450)
RBC: 5.04 x10E6/uL (ref 4.14–5.80)
RDW: 12.8 % (ref 11.6–15.4)
WBC: 7.2 10*3/uL (ref 3.4–10.8)

## 2024-03-19 LAB — HEMOGLOBIN A1C
Est. average glucose Bld gHb Est-mCnc: 143 mg/dL
Hgb A1c MFr Bld: 6.6 % — ABNORMAL HIGH (ref 4.8–5.6)

## 2024-03-19 LAB — LIPID PANEL
Chol/HDL Ratio: 3.1 ratio (ref 0.0–5.0)
Cholesterol, Total: 121 mg/dL (ref 100–199)
HDL: 39 mg/dL — ABNORMAL LOW (ref >39)
LDL Chol Calc (NIH): 63 mg/dL (ref 0–99)
Triglycerides: 104 mg/dL (ref 0–149)
VLDL Cholesterol Cal: 19 mg/dL (ref 5–40)

## 2024-03-19 LAB — TSH: TSH: 2.5 u[IU]/mL (ref 0.450–4.500)

## 2024-03-19 LAB — PSA: Prostate Specific Ag, Serum: 1.2 ng/mL (ref 0.0–4.0)

## 2024-06-06 ENCOUNTER — Other Ambulatory Visit: Payer: Self-pay | Admitting: Internal Medicine

## 2024-06-06 DIAGNOSIS — I10 Essential (primary) hypertension: Secondary | ICD-10-CM

## 2024-06-06 NOTE — Telephone Encounter (Signed)
 Requested Prescriptions  Pending Prescriptions Disp Refills   valsartan -hydrochlorothiazide  (DIOVAN -HCT) 320-25 MG tablet [Pharmacy Med Name: Valsartan -hydroCHLOROthiazide  320-25 MG Oral Tablet] 90 tablet 1    Sig: Take 1 tablet by mouth once daily     Cardiovascular: ARB + Diuretic Combos Passed - 06/06/2024  5:23 PM      Passed - K in normal range and within 180 days    Potassium  Date Value Ref Range Status  03/18/2024 4.2 3.5 - 5.2 mmol/L Final         Passed - Na in normal range and within 180 days    Sodium  Date Value Ref Range Status  03/18/2024 141 134 - 144 mmol/L Final         Passed - Cr in normal range and within 180 days    Creatinine, Ser  Date Value Ref Range Status  03/18/2024 1.13 0.76 - 1.27 mg/dL Final         Passed - eGFR is 10 or above and within 180 days    GFR calc Af Amer  Date Value Ref Range Status  07/15/2020 >60 >60 mL/min Final   GFR, Estimated  Date Value Ref Range Status  05/21/2022 >60 >60 mL/min Final    Comment:    (NOTE) Calculated using the CKD-EPI Creatinine Equation (2021)    eGFR  Date Value Ref Range Status  03/18/2024 74 >59 mL/min/1.73 Final         Passed - Patient is not pregnant      Passed - Last BP in normal range    BP Readings from Last 1 Encounters:  03/18/24 122/78         Passed - Valid encounter within last 6 months    Recent Outpatient Visits           2 months ago Annual physical exam   [place] [facility], Leita DEL, MD   6 months ago Post-COVID chronic cough   [place] [facility] at [facility], Selinda PARAS, MD       Future Appointments             In 2 weeks Justus Leita DEL, MD [facility] at [facility], [facility]   In 3 months Justus, Leita DEL, MD [facility] at [facility], [facility]

## 2024-06-26 ENCOUNTER — Encounter: Payer: Self-pay | Admitting: Internal Medicine

## 2024-06-26 ENCOUNTER — Ambulatory Visit (INDEPENDENT_AMBULATORY_CARE_PROVIDER_SITE_OTHER): Admitting: Internal Medicine

## 2024-06-26 VITALS — BP 122/74 | HR 64 | Ht 74.0 in | Wt 237.0 lb

## 2024-06-26 DIAGNOSIS — I10 Essential (primary) hypertension: Secondary | ICD-10-CM

## 2024-06-26 DIAGNOSIS — Z23 Encounter for immunization: Secondary | ICD-10-CM

## 2024-06-26 DIAGNOSIS — R7303 Prediabetes: Secondary | ICD-10-CM

## 2024-06-26 LAB — POCT GLYCOSYLATED HEMOGLOBIN (HGB A1C): Hemoglobin A1C: 6.1 % — AB (ref 4.0–5.6)

## 2024-06-26 NOTE — Assessment & Plan Note (Addendum)
 BS being controlled with diet only. Last visit A1c was higher and recommended weight loss and ongoing diet changes.  He has been making a few changes and lost 2 lbs. Lab Results  Component Value Date   HGBA1C 6.6 (H) 03/18/2024  A1C today = 6.1 - he is encouraged to continue dietary efforts to avoid progression.

## 2024-06-26 NOTE — Progress Notes (Signed)
 Date:  06/26/2024   Name:  John Hanna   DOB:  [birth year]   MRN:  [id number]   Chief Complaint: Diabetes  Hypertension This is a chronic problem. The problem is controlled. Pertinent negatives include no chest pain, headaches, palpitations or shortness of breath. Past treatments include beta blockers, diuretics and angiotensin blockers. The current treatment provides significant improvement.  Diabetes He presents for his follow-up diabetic visit. Diabetes type: prediabetes with higher A1C last visit. Pertinent negatives for hypoglycemia include no dizziness or headaches. Pertinent negatives for diabetes include no chest pain, no fatigue and no weakness.    Review of Systems  Constitutional:  Negative for fatigue and unexpected weight change.  HENT:  Negative for nosebleeds.   Eyes:  Negative for visual disturbance.  Respiratory:  Negative for cough, chest tightness, shortness of breath and wheezing.   Cardiovascular:  Negative for chest pain, palpitations and leg swelling.  Gastrointestinal:  Negative for abdominal pain, constipation and diarrhea.  Neurological:  Negative for dizziness, weakness, light-headedness and headaches.     Lab Results  Component Value Date   NA 141 03/18/2024   K 4.2 03/18/2024   CO2 23 03/18/2024   GLUCOSE 98 03/18/2024   BUN 13 03/18/2024   CREATININE 1.13 03/18/2024   CALCIUM 9.7 03/18/2024   EGFR 74 03/18/2024   GFRNONAA >60 05/21/2022   Lab Results  Component Value Date   CHOL 121 03/18/2024   HDL 39 (L) 03/18/2024   LDLCALC 63 03/18/2024   TRIG 104 03/18/2024   CHOLHDL 3.1 03/18/2024   Lab Results  Component Value Date   TSH 2.500 03/18/2024   Lab Results  Component Value Date   HGBA1C 6.1 (A) 06/26/2024   Lab Results  Component Value Date   WBC 7.2 03/18/2024   HGB 14.9 03/18/2024   HCT 47.0 03/18/2024   MCV 93 03/18/2024   PLT 197 03/18/2024   Lab Results  Component Value Date   ALT 36 03/18/2024   AST 22  03/18/2024   ALKPHOS 59 03/18/2024   BILITOT 0.8 03/18/2024   No results found for: MARIEN BOLLS, VD25OH   Patient Active Problem List   Diagnosis Date Noted   Adenomatous polyp of colon 03/28/2023   Mixed hyperlipidemia 03/06/2022   Prediabetes 03/07/2021   Acquired thrombophilia (HCC) 03/03/2021   S/P ablation of atrial fibrillation 12/01/2020   Coronary artery disease involving native coronary artery of native heart without angina pectoris 05/04/2020   Paroxysmal atrial fibrillation (HCC) 02/26/2020   OSA on CPAP 06/20/2019   BPH associated with nocturia 02/21/2018   Essential (primary) hypertension 07/26/2015   History of colon polyps 07/26/2015   H/O malignant neoplasm of skin 07/26/2015   Plantar fasciitis 07/26/2015    Allergies  Allergen Reactions   Cyclobenzaprine  Other (See Comments)    Acute urinary retention    Past Surgical History:  Procedure Laterality Date   CARDIAC ELECTROPHYSIOLOGY STUDY AND ABLATION  07/20/2020   COLONOSCOPY  01/2011   repeat 3 yrs - @ [facility]   COLONOSCOPY WITH PROPOFOL  N/A 03/28/2023   Procedure: COLONOSCOPY WITH PROPOFOL ;  Surgeon: Therisa Bi, MD;  Location: [facility] ENDOSCOPY;  Service: Gastroenterology;  Laterality: N/A;   HERNIA REPAIR  1992   HOLEP-LASER ENUCLEATION OF THE PROSTATE WITH MORCELLATION N/A 07/03/2022   Procedure: HOLEP-LASER ENUCLEATION OF THE PROSTATE WITH MORCELLATION;  Surgeon: Penne Knee, MD;  Location: [facility] ORS;  Service: Urology;  Laterality: N/A;   LEFT HEART CATH AND CORONARY  ANGIOGRAPHY N/A 07/15/2020   Procedure: LEFT HEART CATH AND CORONARY ANGIOGRAPHY;  Surgeon: Hester Wolm PARAS, MD;  Location: [facility] INVASIVE CV LAB;  Service: Cardiovascular;  Laterality: N/A;    Social History   Tobacco Use   Smoking status: Former    Current packs/day: 0.00    Average packs/day: 0.5 packs/day for 5.0 years (2.5 ttl pk-yrs)    Types: Cigarettes    Start date: 10/16/1982    Quit  date: 10/17/1987    Years since quitting: 36.7   Smokeless tobacco: Never  Vaping Use   Vaping status: Never Used  Substance Use Topics   Alcohol use: No    Alcohol/week: 0.0 standard drinks of alcohol   Drug use: Never     Medication list has been reviewed and updated.  Current Meds  Medication Sig   atorvastatin (LIPITOR) 40 MG tablet Take 40 mg by mouth daily.   metoprolol  succinate (TOPROL -XL) 25 MG 24 hr tablet Take 25 mg by mouth daily.   NON FORMULARY CPAP Nightly.   valsartan -hydrochlorothiazide  (DIOVAN -HCT) 320-25 MG tablet Take 1 tablet by mouth once daily   XARELTO 20 MG TABS tablet SMARTSIG:1 Tablet(s) By Mouth Every Evening       06/26/2024    9:38 AM 03/18/2024    9:38 AM 11/23/2023    9:23 AM 08/08/2023    2:43 PM  GAD 7 : Generalized Anxiety Score  Nervous, Anxious, on Edge 0 0 1 0  Control/stop worrying 0 0 0 0  Worry too much - different things 0 0 0 0  Trouble relaxing 0 0 0 0  Restless 0 0 0 0  Easily annoyed or irritable 0 0 0 0  Afraid - awful might happen 0 0 0 0  Total GAD 7 Score 0 0 1 0  Anxiety Difficulty Not difficult at all Not difficult at all Not difficult at all Not difficult at all       06/26/2024    9:38 AM 03/18/2024    9:35 AM 11/23/2023    9:23 AM  Depression screen PHQ 2/9  Decreased Interest 0 0 1  Down, Depressed, Hopeless 0 0 0  PHQ - 2 Score 0 0 1  Altered sleeping 0 0 1  Tired, decreased energy 0 0 1  Change in appetite 0 0 0  Feeling bad or failure about yourself  0 0 0  Trouble concentrating 0 0 0  Moving slowly or fidgety/restless 0 0 1  Suicidal thoughts 0 0 0  PHQ-9 Score 0 0 4  Difficult doing work/chores Not difficult at all Not difficult at all Not difficult at all    BP Readings from Last 3 Encounters:  06/26/24 122/74  03/18/24 122/78  11/23/23 120/80    Physical Exam Vitals and nursing note reviewed.  Constitutional:      General: He is not in acute distress.    Appearance: He is well-developed.  HENT:      Head: Normocephalic and atraumatic.  Pulmonary:     Effort: Pulmonary effort is normal. No respiratory distress.  Skin:    General: Skin is warm and dry.     Findings: No rash.  Neurological:     Mental Status: He is alert and oriented to person, place, and time.  Psychiatric:        Mood and Affect: Mood normal.        Behavior: Behavior normal.     Wt Readings from Last 3 Encounters:  06/26/24 237 lb (107.5  kg)  03/18/24 239 lb (108.4 kg)  11/23/23 239 lb (108.4 kg)    BP 122/74   Pulse 64   Ht 6' 2 (1.88 m)   Wt 237 lb (107.5 kg)   SpO2 99%   BMI 30.43 kg/m   Assessment and Plan:  Problem List Items Addressed This Visit       Unprioritized   Essential (primary) hypertension - Primary (Chronic)   Blood pressure is well controlled on Valsartan , hydrochlorothiazide  and metoprolol . No medication side effects noted. Renal function normal. Plan to continue current medications.       Prediabetes (Chronic)   BS being controlled with diet only. Last visit A1c was higher and recommended weight loss and ongoing diet changes.  He has been making a few changes and lost 2 lbs. Lab Results  Component Value Date   HGBA1C 6.6 (H) 03/18/2024  A1C today = 6.1 - he is encouraged to continue dietary efforts to avoid progression.       Relevant Orders   POCT glycosylated hemoglobin (Hb A1C) (Completed)   Other Visit Diagnoses       Encounter for immunization       Relevant Orders   Flu vaccine trivalent PF, 6mos and older(Flulaval,Afluria,Fluarix,Fluzone) (Completed)       Return for TOC and CPX - HTN, Pre-DM  Dr Rolan.    Leita HILARIO Adie, MD [facility] [place]

## 2024-06-26 NOTE — Assessment & Plan Note (Signed)
 Blood pressure is well controlled on Valsartan , hydrochlorothiazide  and metoprolol . No medication side effects noted. Renal function normal. Plan to continue current medications.

## 2024-09-17 ENCOUNTER — Ambulatory Visit: Admitting: Internal Medicine

## 2025-03-18 ENCOUNTER — Encounter: Admitting: Physician Assistant
